# Patient Record
Sex: Female | Born: 1935
Health system: Southern US, Community
[De-identification: ages and names within clinical notes are randomized; demographics above are authoritative.]

## PROBLEM LIST (undated history)

## (undated) DIAGNOSIS — I4891 Unspecified atrial fibrillation: Secondary | ICD-10-CM

## (undated) DIAGNOSIS — I10 Essential (primary) hypertension: Secondary | ICD-10-CM

## (undated) DIAGNOSIS — E785 Hyperlipidemia, unspecified: Secondary | ICD-10-CM

## (undated) DIAGNOSIS — J449 Chronic obstructive pulmonary disease, unspecified: Secondary | ICD-10-CM

## (undated) HISTORY — DX: Essential (primary) hypertension: I10

## (undated) HISTORY — DX: Chronic obstructive pulmonary disease, unspecified: J44.9

## (undated) HISTORY — PX: CAROTID ANGIOGRAM: SHX5765

## (undated) HISTORY — DX: Hyperlipidemia, unspecified: E78.5

## (undated) HISTORY — DX: Unspecified atrial fibrillation: I48.91

---

## 2014-05-22 ENCOUNTER — Encounter: Payer: Self-pay | Admitting: Cardiology

## 2017-01-24 ENCOUNTER — Encounter: Payer: Self-pay | Admitting: Cardiology

## 2017-10-21 ENCOUNTER — Encounter: Payer: Self-pay | Admitting: Cardiology

## 2017-10-22 ENCOUNTER — Encounter: Payer: Self-pay | Admitting: Cardiology

## 2019-02-18 ENCOUNTER — Telehealth: Payer: Self-pay | Admitting: Pulmonary Disease

## 2019-02-18 NOTE — Telephone Encounter (Signed)
[place]. Is with AO tomorrow.

## 2019-02-19 ENCOUNTER — Encounter: Payer: Self-pay | Admitting: Pulmonary Disease

## 2019-02-19 ENCOUNTER — Telehealth: Payer: Self-pay | Admitting: Pulmonary Disease

## 2019-02-19 ENCOUNTER — Ambulatory Visit (INDEPENDENT_AMBULATORY_CARE_PROVIDER_SITE_OTHER): Payer: Medicare Other | Admitting: Pulmonary Disease

## 2019-02-19 ENCOUNTER — Other Ambulatory Visit: Payer: Self-pay

## 2019-02-19 VITALS — BP 118/74 | HR 70 | Temp 98.1°F | Ht 66.0 in | Wt 193.0 lb

## 2019-02-19 DIAGNOSIS — J441 Chronic obstructive pulmonary disease with (acute) exacerbation: Secondary | ICD-10-CM | POA: Diagnosis not present

## 2019-02-19 DIAGNOSIS — G4733 Obstructive sleep apnea (adult) (pediatric): Secondary | ICD-10-CM

## 2019-02-19 NOTE — Progress Notes (Signed)
Tiffany BashShelby Jennings    [id number]    [birth year]  Primary Care Physician:Patient, No Pcp Per  Referring Physician: No referring provider defined for this encounter.  Chief complaint:   Patient with a history of obstructive lung disease, chronic respiratory failure In for initial visit  HPI:  Diagnosed with COPD many years ago Relocated from [place]  Smokes about 3 to 4 cigarettes a day, over a pack a day in the past History of chronic respiratory failure-has been on oxygen at 2 L She checks her oxygen on a regular basis, off oxygen she runs as low as 83, mostly keeps it in the low 90s with oxygen at 2 L She is able to walk with a walker about 50 yards or bit more  History of atrial fibrillation  She is stable on Spiriva and albuterol as needed Uses albuterol about 4 times a day  History of chronic respiratory failure, was started on BiPAP-has been on BiPAP for 2 to 3 years    No outpatient encounter medications on file as of 02/19/2019.   No facility-administered encounter medications on file as of 02/19/2019.     Allergies as of 02/19/2019 - Review Complete 02/19/2019  Allergen Reaction Noted  . Codeine  02/19/2018    Past Medical History:  Diagnosis Date  . A-fib (HCC)   . COPD (chronic obstructive pulmonary disease) (HCC)   . Hyperlipidemia   . Hypertension     Past Surgical History:  Procedure Laterality Date  . CAROTID ANGIOGRAM      History reviewed. No pertinent family history.  Social History   Socioeconomic History  . Marital status: Single    Spouse name: Not on file  . Number of children: Not on file  . Years of education: Not on file  . Highest education level: Not on file  Occupational History  . Not on file  Social Needs  . Financial resource strain: Not on file  . Food insecurity    Worry: Not on file    Inability: Not on file  . Transportation needs    Medical: Not on file    Non-medical: Not on file  Tobacco Use  .  Smoking status: Current Every Day Smoker    Packs/day: 0.25    Types: Cigarettes  . Smokeless tobacco: Never Used  . Tobacco comment: 4-5 cigs a day  Substance and Sexual Activity  . Alcohol use: Not Currently  . Drug use: Never  . Sexual activity: Not on file  Lifestyle  . Physical activity    Days per week: Not on file    Minutes per session: Not on file  . Stress: Not on file  Relationships  . Social Musicianconnections    Talks on phone: Not on file    Gets together: Not on file    Attends religious service: Not on file    Active member of club or organization: Not on file    Attends meetings of clubs or organizations: Not on file    Relationship status: Not on file  . Intimate partner violence    Fear of current or ex partner: Not on file    Emotionally abused: Not on file    Physically abused: Not on file    Forced sexual activity: Not on file  Other Topics Concern  . Not on file  Social History Narrative  . Not on file    Review of Systems  Constitutional: Negative.   HENT:  Negative.   Eyes: Negative.   Respiratory: Positive for shortness of breath and wheezing.   Cardiovascular: Negative.   Gastrointestinal: Negative.   Endocrine: Negative.   All other systems reviewed and are negative.   Vitals:   02/19/19 1004 02/19/19 1008  BP:  118/74  Pulse:  70  Temp: 98.1 F (36.7 C)   SpO2:  95%     Physical Exam  Constitutional: She appears well-developed and well-nourished.  HENT:  Head: Normocephalic and atraumatic.  Eyes: Pupils are equal, round, and reactive to light. Right eye exhibits no discharge. Left eye exhibits no discharge.  Neck: Normal range of motion. Neck supple. No tracheal deviation present. No thyromegaly present.  Cardiovascular: Normal rate and regular rhythm.  Pulmonary/Chest: Effort normal. No respiratory distress. She has wheezes. She has no rales.  Abdominal: Soft. Bowel sounds are normal. She exhibits no distension. There is no abdominal  tenderness.     Data Reviewed: Records from previous primary physician reviewed Had a CT scan done in 2019 showing some granulomatous disease-stable from previous No PFT  Assessment:  Chronic respiratory failure -Secondary to obstructive lung disease -On oxygen supplementation at 2 L -On BiPAP at night 12/5 -Download from her machine-compliant, has an AHI of 9  Chronic obstructive pulmonary disease -Controlled symptoms on Spiriva and Proventil - Active smoker -Working on quitting -Currently smokes about 3 to 4 cigarettes a day  Deconditioning -We will start/increase activity as tolerated  Atrial fibrillation -Controlled    Plan/Recommendations: Obtain pulmonary function study Exercise oximetry-she was ambulated in the office today, required 2 L of oxygen to keep saturations over 90, she was 83% on room air Continue BiPAP -Encouraged to always bring SD card in whenever she comes in for follow-up  Continue Spiriva and albuterol  Encouraged exercise on a regular basis  Moding cessation counseling  I will see her back in the office in 3 months  Prescription to DME company for BiPAP supplies and oxygen supplementation  Encouraged to call with any significant concerns  Sherrilyn Rist MD [facility] 02/19/2019, 10:14 AM  CC: No ref. provider found

## 2019-02-19 NOTE — Telephone Encounter (Signed)
DME company is [employer]  Order for oxygen at 2 L with rest and activity  BiPAP supplies

## 2019-02-19 NOTE — Telephone Encounter (Signed)
The patient was in the office today and we have completed her walk for oxygen certification.

## 2019-02-19 NOTE — Patient Instructions (Addendum)
Chronic respiratory failure on oxygen at 2 L Chronic obstructive pulmonary disease  We will obtain a breathing study on you, plus a blood gas, 6-minute walking distance Continue using oxygen at 2 L, may bump it up to 3 L with activity Continue working on quitting smoking  Continue with Spiriva and albuterol Continue BiPAP at night  Call with any concerns  I will see you back in the office in 3 months

## 2019-02-19 NOTE — Telephone Encounter (Signed)
Order has been places for BIPAP supplies and walk for qualifying for oxygen has also been documented.

## 2019-03-02 ENCOUNTER — Telehealth: Payer: Self-pay | Admitting: Pulmonary Disease

## 2019-03-02 NOTE — Telephone Encounter (Signed)
I called pt to ask her if she did a sleep study in [place], where she relocated from, since [place] is asking for a copy of the sleep test. SHe satated she never did a sleep study and that she was put on the bipap for her CO2 levels. Versus sleep apnea. [employer] is closed and we can try to call them in the morning to let [place] know.

## 2019-03-03 ENCOUNTER — Telehealth: Payer: Self-pay

## 2019-03-03 NOTE — Telephone Encounter (Signed)
I am not accepting new patients at this time but Dr. Loletha Grayer is a wonderful female physician.

## 2019-03-03 NOTE — Telephone Encounter (Signed)
Spoke with Estill Bamberg at [facility], made her aware of the situation.  Estill Bamberg states that insurance will not cover bipap supplies without a sleep study, even if the bipap has been prescribed for a legitimate medical reason.  Estill Bamberg will forward this to the [facility] respiratory team to see what next steps need to be done to get these supplies for the pt.  Forwarding to Dr. Ander Slade as a FYI.

## 2019-03-03 NOTE — Telephone Encounter (Signed)
Copied from [place] [id number]. Topic: Appointment Scheduling - Scheduling Inquiry for Clinic >> Mar 03, 2019  2:44 PM Scherrie Gerlach wrote: Reason for CRM: Pt wants to know if Dr Deborra Medina will accept her as a pt.  Advised Dr Deborra Medina did not have nay NPP, and she asked me to ask anyway.

## 2019-03-04 NOTE — Telephone Encounter (Signed)
Will leave open for a few days or until we hear further needs from [place] at [facility]

## 2019-03-04 NOTE — Telephone Encounter (Signed)
Pt can be scheduled with Dr. Loletha Grayer if she would like, Dr. Deborra Medina is unable to accept any new patients at this time.

## 2019-03-04 NOTE — Telephone Encounter (Signed)
Called pt but had to leave a message.

## 2019-03-05 ENCOUNTER — Telehealth: Payer: Self-pay

## 2019-03-05 NOTE — Telephone Encounter (Signed)

## 2019-03-06 ENCOUNTER — Other Ambulatory Visit: Payer: Self-pay

## 2019-03-06 ENCOUNTER — Ambulatory Visit (INDEPENDENT_AMBULATORY_CARE_PROVIDER_SITE_OTHER): Payer: Medicare Other | Admitting: Family Medicine

## 2019-03-06 ENCOUNTER — Encounter: Payer: Self-pay | Admitting: Family Medicine

## 2019-03-06 VITALS — BP 120/70 | HR 74 | Ht 66.0 in | Wt 193.0 lb

## 2019-03-06 DIAGNOSIS — E039 Hypothyroidism, unspecified: Secondary | ICD-10-CM | POA: Diagnosis not present

## 2019-03-06 DIAGNOSIS — E7800 Pure hypercholesterolemia, unspecified: Secondary | ICD-10-CM | POA: Diagnosis not present

## 2019-03-06 DIAGNOSIS — I4891 Unspecified atrial fibrillation: Secondary | ICD-10-CM | POA: Diagnosis not present

## 2019-03-06 DIAGNOSIS — Z23 Encounter for immunization: Secondary | ICD-10-CM | POA: Diagnosis not present

## 2019-03-06 DIAGNOSIS — I10 Essential (primary) hypertension: Secondary | ICD-10-CM | POA: Insufficient documentation

## 2019-03-06 DIAGNOSIS — F339 Major depressive disorder, recurrent, unspecified: Secondary | ICD-10-CM | POA: Insufficient documentation

## 2019-03-06 DIAGNOSIS — Z0000 Encounter for general adult medical examination without abnormal findings: Secondary | ICD-10-CM

## 2019-03-06 NOTE — Patient Instructions (Signed)
Hypothyroidism  Hypothyroidism is when the thyroid gland does not make enough of certain hormones (it is underactive). The thyroid gland is a small gland located in the lower front part of the neck, just in front of the windpipe (trachea). This gland makes hormones that help control how the body uses food for energy (metabolism) as well as how the heart and brain function. These hormones also play a role in keeping your bones strong. When the thyroid is underactive, it produces too little of the hormones thyroxine (T4) and triiodothyronine (T3). What are the causes? This condition may be caused by:  Hashimoto's disease. This is a disease in which the body's disease-fighting system (immune system) attacks the thyroid gland. This is the most common cause.  Viral infections.  Pregnancy.  Certain medicines.  Birth defects.  Past radiation treatments to the head or neck for cancer.  Past treatment with radioactive iodine.  Past exposure to radiation in the environment.  Past surgical removal of part or all of the thyroid.  Problems with a gland in the center of the brain (pituitary gland).  Lack of enough iodine in the diet. What increases the risk? You are more likely to develop this condition if:  You are female.  You have a family history of thyroid conditions.  You use a medicine called lithium.  You take medicines that affect the immune system (immunosuppressants). What are the signs or symptoms? Symptoms of this condition include:  Feeling as though you have no energy (lethargy).  Not being able to tolerate cold.  Weight gain that is not explained by a change in diet or exercise habits.  Lack of appetite.  Dry skin.  Coarse hair.  Menstrual irregularity.  Slowing of thought processes.  Constipation.  Sadness or depression. How is this diagnosed? This condition may be diagnosed based on:  Your symptoms, your medical history, and a physical exam.  Blood  tests. You may also have imaging tests, such as an ultrasound or MRI. How is this treated? This condition is treated with medicine that replaces the thyroid hormones that your body does not make. After you begin treatment, it may take several weeks for symptoms to go away. Follow these instructions at home:  Take over-the-counter and prescription medicines only as told by your health care provider.  If you start taking any new medicines, tell your health care provider.  Keep all follow-up visits as told by your health care provider. This is important. ? As your condition improves, your dosage of thyroid hormone medicine may change. ? You will need to have blood tests regularly so that your health care provider can monitor your condition. Contact a health care provider if:  Your symptoms do not get better with treatment.  You are taking thyroid replacement medicine and you: ? Sweat a lot. ? Have tremors. ? Feel anxious. ? Lose weight rapidly. ? Cannot tolerate heat. ? Have emotional swings. ? Have diarrhea. ? Feel weak. Get help right away if you have:  Chest pain.  An irregular heartbeat.  A rapid heartbeat.  Difficulty breathing. Summary  Hypothyroidism is when the thyroid gland does not make enough of certain hormones (it is underactive).  When the thyroid is underactive, it produces too little of the hormones thyroxine (T4) and triiodothyronine (T3).  The most common cause is Hashimoto's disease, a disease in which the body's disease-fighting system (immune system) attacks the thyroid gland. The condition can also be caused by viral infections, medicine, pregnancy, or past   radiation treatment to the head or neck.  Symptoms may include weight gain, dry skin, constipation, feeling as though you do not have energy, and not being able to tolerate cold.  This condition is treated with medicine to replace the thyroid hormones that your body does not make. This information  is not intended to replace advice given to you by your health care provider. Make sure you discuss any questions you have with your health care provider. Document Released: 06/04/2005 Document Revised: 05/17/2017 Document Reviewed: 05/15/2017 Elsevier Patient Education  2020 Elsevier Inc.  

## 2019-03-06 NOTE — Progress Notes (Signed)
Established Patient Office Visit  Subjective:  Patient ID: Tiffany Jennings, female    DOB: Jan 02, 1935  Age: 84 y.o. MRN: [id number]  CC:  Chief Complaint  Patient presents with  . Establish Care    HPI Tiffany Jennings presents for establishment of care after recently moving into this area from [facility].  Carries a past medical history of atrial flutter, hypertension elevated cholesterol all managed with her current medical therapy.  History of COPD treated with chronic oxygen therapy, Spiriva and as needed albuterol.  History of hypothyroidism treated with levothyroxine.  History of depression treated with imipramine for the past 25 years.  She does well with this drug and would like to continue it.  Past Medical History:  Diagnosis Date  . A-fib ([place])   . COPD (chronic obstructive pulmonary disease) ([place])   . Hyperlipidemia   . Hypertension     Past Surgical History:  Procedure Laterality Date  . CAROTID ANGIOGRAM      History reviewed. No pertinent family history.  Social History   Socioeconomic History  . Marital status: Single    Spouse name: Not on file  . Number of children: Not on file  . Years of education: Not on file  . Highest education level: Not on file  Occupational History  . Not on file  Social Needs  . Financial resource strain: Not on file  . Food insecurity    Worry: Not on file    Inability: Not on file  . Transportation needs    Medical: Not on file    Non-medical: Not on file  Tobacco Use  . Smoking status: Current Every Day Smoker    Packs/day: 0.25    Types: Cigarettes  . Smokeless tobacco: Never Used  . Tobacco comment: 4-5 cigs a day  Substance and Sexual Activity  . Alcohol use: Not Currently  . Drug use: Never  . Sexual activity: Not on file  Lifestyle  . Physical activity    Days per week: Not on file    Minutes per session: Not on file  . Stress: Not on file  Relationships  . Social Herbalist on phone: Not on  file    Gets together: Not on file    Attends religious service: Not on file    Active member of club or organization: Not on file    Attends meetings of clubs or organizations: Not on file    Relationship status: Not on file  . Intimate partner violence    Fear of current or ex partner: Not on file    Emotionally abused: Not on file    Physically abused: Not on file    Forced sexual activity: Not on file  Other Topics Concern  . Not on file  Social History Narrative  . Not on file    Outpatient Medications Prior to Visit  Medication Sig Dispense Refill  . albuterol (VENTOLIN HFA) 108 (90 Base) MCG/ACT inhaler Inhale 2 puffs into the lungs every 6 (six) hours as needed for wheezing or shortness of breath.    Marland Kitchen amiodarone (PACERONE) 200 MG tablet Take 100 mg by mouth daily.    Marland Kitchen apixaban (ELIQUIS) 5 MG TABS tablet Take 1 tablet by mouth 2 (two) times daily.    Marland Kitchen atorvastatin (LIPITOR) 20 MG tablet Take 0.5 tablets by mouth daily.    Marland Kitchen diltiazem (CARDIZEM CD) 120 MG 24 hr capsule Take 1 capsule by mouth daily.    Marland Kitchen  donepezil (ARICEPT) 10 MG tablet Take 10 mg by mouth at bedtime.    . furosemide (LASIX) 20 MG tablet Take 20 mg by mouth daily.    Marland Kitchen imipramine (TOFRANIL) 50 MG tablet Take 50 mg by mouth daily.    Marland Kitchen levothyroxine (SYNTHROID) 75 MCG tablet Take 1 tablet by mouth daily.    . Tiotropium Bromide Monohydrate (SPIRIVA RESPIMAT) 2.5 MCG/ACT AERS Inhale 2 puffs into the lungs daily.    Marland Kitchen imipramine (TOFRANIL) 25 MG tablet Take 1 tablet by mouth daily.     No facility-administered medications prior to visit.     Allergies  Allergen Reactions  . Codeine     ROS Review of Systems  Constitutional: Negative.   HENT: Negative.   Eyes: Negative for photophobia and visual disturbance.  Respiratory: Positive for shortness of breath and wheezing. Negative for chest tightness.   Cardiovascular: Positive for palpitations. Negative for chest pain.  Gastrointestinal: Negative.    Endocrine: Negative for polyphagia and polyuria.  Genitourinary: Negative.   Musculoskeletal: Positive for gait problem. Negative for myalgias.  Skin: Negative for pallor and rash.  Allergic/Immunologic: Negative for immunocompromised state.  Neurological: Negative for seizures and speech difficulty.  Psychiatric/Behavioral: Positive for dysphoric mood.      Objective:    Physical Exam  Constitutional: She is oriented to person, place, and time. She appears well-developed and well-nourished. No distress.  HENT:  Head: Normocephalic and atraumatic.  Right Ear: External ear normal.  Left Ear: External ear normal.  Mouth/Throat: Oropharynx is clear and moist. No oropharyngeal exudate.  Eyes: Pupils are equal, round, and reactive to light. Conjunctivae are normal. Right eye exhibits no discharge. Left eye exhibits no discharge. No scleral icterus.  Neck: Neck supple. No JVD present. No tracheal deviation present. No thyromegaly present.  Cardiovascular: Normal rate, regular rhythm and normal heart sounds.  Pulmonary/Chest: Effort normal and breath sounds normal. No stridor.  Abdominal: Soft. Bowel sounds are normal.  Musculoskeletal:        General: No edema.  Lymphadenopathy:    She has no cervical adenopathy.  Neurological: She is alert and oriented to person, place, and time.  Skin: Skin is warm and dry. She is not diaphoretic.  Psychiatric: She has a normal mood and affect. Her behavior is normal.    BP 120/70   Pulse 74   Ht '5\' 6"'  (1.676 m)   Wt 193 lb (87.5 kg)   SpO2 91%   BMI 31.15 kg/m  Wt Readings from Last 3 Encounters:  03/06/19 193 lb (87.5 kg)  02/19/19 193 lb (87.5 kg)   BP Readings from Last 3 Encounters:  03/06/19 120/70  02/19/19 118/74   Guideline developer:  UpToDate (see UpToDate for funding source) Date Released: June 2014  Health Maintenance Due  Topic Date Due  . URINE MICROALBUMIN  06/17/1947  . DEXA SCAN  06/16/2002  . PNA vac Low Risk  Adult (1 of 2 - PCV13) 06/16/2002    There are no preventive care reminders to display for this patient.  No results found for: TSH No results found for: WBC, HGB, HCT, MCV, PLT No results found for: NA, K, CHLORIDE, CO2, GLUCOSE, BUN, CREATININE, BILITOT, ALKPHOS, AST, ALT, PROT, ALBUMIN, CALCIUM, ANIONGAP, EGFR, GFR No results found for: CHOL No results found for: HDL No results found for: LDLCALC No results found for: TRIG No results found for: CHOLHDL No results found for: HGBA1C    Assessment & Plan:   Problem List Items Addressed This  Visit      Cardiovascular and Mediastinum   Atrial fibrillation [facility])   Relevant Orders   Ambulatory referral to Cardiology   Essential hypertension - Primary   Relevant Orders   CBC   Comprehensive metabolic panel   Ambulatory referral to Cardiology     Endocrine   Hypothyroidism   Relevant Orders   TSH     Other   Depression, recurrent ([place])   Relevant Medications   imipramine (TOFRANIL) 50 MG tablet   Elevated cholesterol   Relevant Orders   LDL cholesterol, direct   Ambulatory referral to Cardiology    Other Visit Diagnoses    Healthcare maintenance       Relevant Orders   Ambulatory referral to Ophthalmology   Need for influenza vaccination       Relevant Orders   Flu Vaccine QUAD High Dose(Fluad) (Completed)      No orders of the defined types were placed in this encounter.   Follow-up: Return in about 3 months (around 06/05/2019).

## 2019-03-07 LAB — COMPREHENSIVE METABOLIC PANEL
AG Ratio: 1.5 (calc) (ref 1.0–2.5)
ALT: 13 U/L (ref 6–29)
AST: 15 U/L (ref 10–35)
Albumin: 3.8 g/dL (ref 3.6–5.1)
Alkaline phosphatase (APISO): 90 U/L (ref 37–153)
BUN/Creatinine Ratio: 17 (calc) (ref 6–22)
BUN: 24 mg/dL (ref 7–25)
CO2: 28 mmol/L (ref 20–32)
Calcium: 9.3 mg/dL (ref 8.6–10.4)
Chloride: 101 mmol/L (ref 98–110)
Creat: 1.38 mg/dL — ABNORMAL HIGH (ref 0.60–0.88)
Globulin: 2.5 g/dL (calc) (ref 1.9–3.7)
Glucose, Bld: 83 mg/dL (ref 65–99)
Potassium: 5 mmol/L (ref 3.5–5.3)
Sodium: 141 mmol/L (ref 135–146)
Total Bilirubin: 0.3 mg/dL (ref 0.2–1.2)
Total Protein: 6.3 g/dL (ref 6.1–8.1)

## 2019-03-07 LAB — CBC
HCT: 37.3 % (ref 35.0–45.0)
Hemoglobin: 11.9 g/dL (ref 11.7–15.5)
MCH: 29 pg (ref 27.0–33.0)
MCHC: 31.9 g/dL — ABNORMAL LOW (ref 32.0–36.0)
MCV: 91 fL (ref 80.0–100.0)
MPV: 10.7 fL (ref 7.5–12.5)
Platelets: 361 10*3/uL (ref 140–400)
RBC: 4.1 10*6/uL (ref 3.80–5.10)
RDW: 13.5 % (ref 11.0–15.0)
WBC: 8.9 10*3/uL (ref 3.8–10.8)

## 2019-03-07 LAB — LDL CHOLESTEROL, DIRECT: Direct LDL: 86 mg/dL (ref <100)

## 2019-03-07 LAB — TSH: TSH: 0.59 mIU/L (ref 0.40–4.50)

## 2019-03-18 DIAGNOSIS — J449 Chronic obstructive pulmonary disease, unspecified: Secondary | ICD-10-CM | POA: Diagnosis not present

## 2019-03-20 ENCOUNTER — Telehealth: Payer: Self-pay | Admitting: Pulmonary Disease

## 2019-03-20 MED ORDER — ALBUTEROL SULFATE HFA 108 (90 BASE) MCG/ACT IN AERS: 2.0000 | INHALATION_SPRAY | Freq: Four times a day (QID) | RESPIRATORY_TRACT | 1 refills | Status: DC | PRN

## 2019-03-20 NOTE — Telephone Encounter (Signed)
Spoke with the pt  She is requesting refill on her albuterol inhaler  Rx was sent  Nothing further needed

## 2019-03-25 ENCOUNTER — Other Ambulatory Visit: Payer: Self-pay | Admitting: Family Medicine

## 2019-03-25 MED ORDER — APIXABAN 5 MG PO TABS: 5.0000 mg | ORAL_TABLET | Freq: Two times a day (BID) | ORAL | 5 refills | Status: DC

## 2019-03-25 NOTE — Telephone Encounter (Signed)
Requested medication (s) are due for refill today: yes  Requested medication (s) are on the active medication list: yes  Last refill:  02/19/2019  Future visit scheduled: no  Notes to clinic:  Review for refill   Requested Prescriptions  Pending Prescriptions Disp Refills   apixaban (ELIQUIS) 5 MG TABS tablet 60 tablet     Sig: Take 1 tablet (5 mg total) by mouth 2 (two) times daily.     Hematology:  Anticoagulants Failed - 03/25/2019 11:27 AM      Failed - Cr in normal range and within 360 days    Creat  Date Value Ref Range Status  03/06/2019 1.38 (H) 0.60 - 0.88 mg/dL Final    Comment:    For patients >64 years of age, the reference limit for Creatinine is approximately 13% higher for people identified as African-American. .          Passed - HGB in normal range and within 360 days    Hemoglobin  Date Value Ref Range Status  03/06/2019 11.9 11.7 - 15.5 g/dL Final         Passed - PLT in normal range and within 360 days    Platelets  Date Value Ref Range Status  03/06/2019 361 140 - 400 Thousand/uL Final         Passed - HCT in normal range and within 360 days    HCT  Date Value Ref Range Status  03/06/2019 37.3 35.0 - 45.0 % Final         Passed - Valid encounter within last 12 months    Recent Outpatient Visits          2 weeks ago Essential hypertension   [facility] [place], Mortimer Fries, MD      Future Appointments            In 5 days Donato Heinz, MD [facility] [place], [facility]

## 2019-03-25 NOTE — Telephone Encounter (Signed)
Medication Refill - Medication: diltiazem (CARDIZEM CD) 120 MG 24 hr capsule, apixaban (ELIQUIS) 5 MG TABS tablet     Has the patient contacted their pharmacy? No. Pts daughter called stating the pt has an appt with cardiologist on 03/30/2019 but pt does not have enough of the medication to last her until then. Please advise.  (Agent: If no, request that the patient contact the pharmacy for the refill.) (Agent: If yes, when and what did the pharmacy advise?)  Preferred Pharmacy (with phone number or street name):  [facility] [place], [address] AT [address]  [place] [address]  Phone: [phone] Fax: [phone]  Not a 24 hour pharmacy; exact hours not known.     Agent: Please be advised that RX refills may take up to 3 business days. We ask that you follow-up with your pharmacy.

## 2019-03-26 NOTE — Progress Notes (Signed)
Cardiology Office Note:    Date:  03/30/2019   ID:  Hedi Barkan, DOB 1935-05-14, MRN [id number]  PCP:  Mliss Sax, MD  Cardiologist:  No primary care provider on file.  Electrophysiologist:  None   Referring MD: Mliss Sax,*   Chief Complaint  Patient presents with  . Atrial Fibrillation   History of Present Illness:    Tiffany Jennings is a 84 y.o. female with a hx of atrial fibrillation on Eliquis, COPD on oxygen, hypertension, hypothyroidism, hyperlipidemia, DVT/PE who is referred by Dr. Doreene Burke for evaluation of atrial fibrillation.  She recently moved to the area from [facility].  Was in hospital for pneumonia several years ago and told she had AF.   She denies any symptoms from atrial fibrillation, and does not think she has had any AF since her hospitalization.  She has been on amiodarone 100 mg daily.  The notes from her PCP in [place] indicate that amiodarone was to be discontinued due to her chronic respiratory disease.  However she has continued to take amiodarone.  She underwent a TTE in July 2020, which showed normal EF, mild concentric hypertrophy, moderate diastolic dysfunction, no significant valvular disease reported.  She has been on Eliquis and diltiazem for her AF.  Denies any bleeding issues.  Denies any chest pain.  Reports chronic dyspnea.  Reports pain in legs with walking.  Past Medical History:  Diagnosis Date  . A-fib (HCC)   . COPD (chronic obstructive pulmonary disease) (HCC)   . Hyperlipidemia   . Hypertension     Past Surgical History:  Procedure Laterality Date  . CAROTID ANGIOGRAM      Current Medications: Current Meds  Medication Sig  . albuterol (VENTOLIN HFA) 108 (90 Base) MCG/ACT inhaler Inhale 2 puffs into the lungs every 6 (six) hours as needed for wheezing or shortness of breath.  Marland Kitchen apixaban (ELIQUIS) 5 MG TABS tablet Take 1 tablet (5 mg total) by mouth 2 (two) times daily.  Marland Kitchen atorvastatin (LIPITOR) 10 MG tablet    . diltiazem (CARDIZEM CD) 120 MG 24 hr capsule Take 1 capsule (120 mg total) by mouth daily.  Marland Kitchen donepezil (ARICEPT) 10 MG tablet Take 10 mg by mouth at bedtime.  . furosemide (LASIX) 20 MG tablet Take 20 mg by mouth daily.  Marland Kitchen imipramine (TOFRANIL) 50 MG tablet Take 50 mg by mouth daily.  Marland Kitchen levothyroxine (SYNTHROID) 75 MCG tablet Take 1 tablet by mouth daily.  . Tiotropium Bromide Monohydrate (SPIRIVA RESPIMAT) 2.5 MCG/ACT AERS Inhale 2 puffs into the lungs daily.  . [DISCONTINUED] amiodarone (PACERONE) 200 MG tablet Take 100 mg by mouth daily. Take half a tablet daily.  . [DISCONTINUED] diltiazem (CARDIZEM CD) 120 MG 24 hr capsule Take 1 capsule by mouth daily.     Allergies:   Codeine   Social History   Socioeconomic History  . Marital status: Single    Spouse name: Not on file  . Number of children: Not on file  . Years of education: Not on file  . Highest education level: Not on file  Occupational History  . Not on file  Social Needs  . Financial resource strain: Not on file  . Food insecurity    Worry: Not on file    Inability: Not on file  . Transportation needs    Medical: Not on file    Non-medical: Not on file  Tobacco Use  . Smoking status: Current Every Day Smoker    Packs/day: 0.25  Types: Cigarettes  . Smokeless tobacco: Never Used  . Tobacco comment: 4-5 cigs a day  Substance and Sexual Activity  . Alcohol use: Not Currently  . Drug use: Never  . Sexual activity: Not on file  Lifestyle  . Physical activity    Days per week: Not on file    Minutes per session: Not on file  . Stress: Not on file  Relationships  . Social Herbalist on phone: Not on file    Gets together: Not on file    Attends religious service: Not on file    Active member of club or organization: Not on file    Attends meetings of clubs or organizations: Not on file    Relationship status: Not on file  Other Topics Concern  . Not on file  Social History Narrative  . Not  on file     Family History: No relevant family history of heart diseaseNo relevant family history of heart disease  ROS:   Please see the history of present illness.     All other systems reviewed and are negative.  EKGs/Labs/Other Studies Reviewed:    The following studies were reviewed today:   EKG:  EKG is ordered today.  The ekg ordered today demonstrates normal sinus rhythm with sinus arrhythmia, rate 69, no ST/T abnormalities  Recent Labs: 03/06/2019: ALT 13; BUN 24; Creat 1.38; Hemoglobin 11.9; Platelets 361; Potassium 5.0; Sodium 141; TSH 0.59  Recent Lipid Panel    Component Value Date/Time   LDLDIRECT 86 03/06/2019 1142    Physical Exam:    VS:  BP 138/73   Pulse 69   Ht 5\' 6"  (1.676 m)   Wt 195 lb (88.5 kg)   SpO2 95%   BMI 31.47 kg/m     Wt Readings from Last 3 Encounters:  03/30/19 195 lb (88.5 kg)  03/06/19 193 lb (87.5 kg)  02/19/19 193 lb (87.5 kg)     GEN:  in no acute distress, on oxygen HEENT: Normal NECK: No JVD; No carotid bruits LYMPHATICS: No lymphadenopathy CARDIAC: RRR, no murmurs, rubs, gallops RESPIRATORY: Bilateral expiratory wheezing ABDOMEN: Soft, non-tender, non-distended MUSCULOSKELETAL:  No edema; No deformity  SKIN: Warm and dry NEUROLOGIC:  Alert and oriented x 3 PSYCHIATRIC:  Normal affect   ASSESSMENT:    1. Atrial fibrillation, unspecified type ([place])   2. Claudication ([place])   3. Hyperlipidemia, unspecified hyperlipidemia type   4. Essential hypertension   5. Tobacco use    PLAN:    In order of problems listed above:  Atrial fibrillation: On diltiazem 120 mg, amiodarone 100 mg, and Eliquis 5 mg twice daily.  CHADS-VASc 4 (HTN, agex2, female).  TTE from 12/2018 shows normal EF, no significant valvular disease, moderate diastolic dysfunction -Discontinue amiodarone due to risk of pulmonary toxicity given her COPD  -Since she does not have symptoms when in atrial fibrillation, can consider monitor in future to evaluate  AF burden -Continue diltiazem for rate control -Continue Eliquis for anticoagulation  Hypertension: On diltiazem 120 mg.  Appears controlled  Hyperlipidemia: On atorvastatin 10 mg.  LDL 86.  Tobacco use: smoked x 30 years for 1 ppd.  Over last 3 months is down to 2-3 cigarettes per day.  Risks of tobacco use were discussed and cessation strongly encouraged  Claudication: We will check ABIs  COPD: on home oxygen.  Follows with pulmonology.  RTC in 3 months   Medication Adjustments/Labs and Tests Ordered: Current medicines are reviewed at  length with the patient today.  Concerns regarding medicines are outlined above.  Orders Placed This Encounter  Procedures  . EKG 12-Lead  . VAS US ABI WITH/WO TBI   Meds ordered this encounter  Medications  . diltiazem (CARDIZEM CD) 120 MG 24 hr capsule    Sig: Take 1 capsule (120 mg total) by mouth daily.    Dispense:  90 capsule    Refill:  3    Patient Instructions  Medication Instructions:  Stop: Amiodarone 200 mg  If you need a refill on your cardiac medications before your next appointment, please call your pharmacy.   Lab work: None  Testing/Procedures: Your physician has requested that you have an ankle brachial index (ABI). During this test an ultrasound and blood pressure cuff are used to evaluate the arteries that supply the arms and legs with blood. Allow thirty minutes for this exam. There are no restrictions or special instructions. [address]   Follow-Up: Your physician recommends that you schedule a follow-up appointment in 3 months with Dr. Bjorn PippinSchumann.         Signed, [place] L Gracelee Stemmler, MD  03/30/2019 3:43 PM    [facility]

## 2019-03-30 ENCOUNTER — Ambulatory Visit: Payer: Medicare Other | Admitting: Cardiology

## 2019-03-30 ENCOUNTER — Other Ambulatory Visit: Payer: Self-pay

## 2019-03-30 VITALS — BP 138/73 | HR 69 | Ht 66.0 in | Wt 195.0 lb

## 2019-03-30 DIAGNOSIS — Z720 Tobacco use: Secondary | ICD-10-CM | POA: Diagnosis not present

## 2019-03-30 DIAGNOSIS — I4891 Unspecified atrial fibrillation: Secondary | ICD-10-CM | POA: Diagnosis not present

## 2019-03-30 DIAGNOSIS — I739 Peripheral vascular disease, unspecified: Secondary | ICD-10-CM

## 2019-03-30 DIAGNOSIS — I10 Essential (primary) hypertension: Secondary | ICD-10-CM

## 2019-03-30 DIAGNOSIS — E785 Hyperlipidemia, unspecified: Secondary | ICD-10-CM | POA: Diagnosis not present

## 2019-03-30 MED ORDER — DILTIAZEM HCL ER COATED BEADS 120 MG PO CP24: 120.0000 mg | ORAL_CAPSULE | Freq: Every day | ORAL | 3 refills | Status: DC

## 2019-03-30 NOTE — Patient Instructions (Signed)
Medication Instructions:  Stop: Amiodarone 200 mg  If you need a refill on your cardiac medications before your next appointment, please call your pharmacy.   Lab work: None  Testing/Procedures: Your physician has requested that you have an ankle brachial index (ABI). During this test an ultrasound and blood pressure cuff are used to evaluate the arteries that supply the arms and legs with blood. Allow thirty minutes for this exam. There are no restrictions or special instructions. [place]. [address]   Follow-Up: Your physician recommends that you schedule a follow-up appointment in 3 months with Dr. Gardiner Rhyme.

## 2019-04-08 ENCOUNTER — Other Ambulatory Visit: Payer: Self-pay | Admitting: Cardiology

## 2019-04-08 DIAGNOSIS — I739 Peripheral vascular disease, unspecified: Secondary | ICD-10-CM

## 2019-04-13 ENCOUNTER — Ambulatory Visit (HOSPITAL_COMMUNITY)
Admission: RE | Admit: 2019-04-13 | Discharge: 2019-04-13 | Disposition: A | Discharge status: Home / Self Care | Payer: Medicare Other | Source: Ambulatory Visit | Attending: Cardiology | Admitting: Cardiology

## 2019-04-13 ENCOUNTER — Other Ambulatory Visit: Payer: Self-pay

## 2019-04-13 DIAGNOSIS — I739 Peripheral vascular disease, unspecified: Secondary | ICD-10-CM

## 2019-04-18 DIAGNOSIS — J449 Chronic obstructive pulmonary disease, unspecified: Secondary | ICD-10-CM | POA: Diagnosis not present

## 2019-05-11 ENCOUNTER — Telehealth: Payer: Self-pay | Admitting: Pulmonary Disease

## 2019-05-11 MED ORDER — SPIRIVA RESPIMAT 2.5 MCG/ACT IN AERS: 2.0000 | INHALATION_SPRAY | Freq: Every day | RESPIRATORY_TRACT | 5 refills | Status: DC

## 2019-05-11 NOTE — Telephone Encounter (Signed)
Call returned to patient daughter Tiffany Jennings (dpr), confirmed DOB, medication, and pharmacy. Refill sent. Voiced understanding. Nothing further needed at this time.

## 2019-05-13 DIAGNOSIS — H16223 Keratoconjunctivitis sicca, not specified as Sjogren's, bilateral: Secondary | ICD-10-CM | POA: Diagnosis not present

## 2019-05-13 DIAGNOSIS — Z961 Presence of intraocular lens: Secondary | ICD-10-CM | POA: Diagnosis not present

## 2019-05-18 DIAGNOSIS — J449 Chronic obstructive pulmonary disease, unspecified: Secondary | ICD-10-CM | POA: Diagnosis not present

## 2019-05-20 ENCOUNTER — Other Ambulatory Visit: Payer: Self-pay | Admitting: Family Medicine

## 2019-05-20 MED ORDER — DONEPEZIL HCL 10 MG PO TABS: 10.0000 mg | ORAL_TABLET | Freq: Every day | ORAL | 1 refills | Status: DC

## 2019-05-20 MED ORDER — IMIPRAMINE HCL 50 MG PO TABS: 50.0000 mg | ORAL_TABLET | Freq: Every day | ORAL | 1 refills | Status: DC

## 2019-05-20 NOTE — Telephone Encounter (Signed)
rx refill imipramine (TOFRANIL) 50 MG tablet  Request 90 day refill  donepezil (ARICEPT) 10 MG tablet    PHARMACY [id number] - [place], [place] - [address] AT [place] [phone] (Phone) [phone] (Fax)

## 2019-05-20 NOTE — Telephone Encounter (Signed)
Rx sent 

## 2019-05-26 ENCOUNTER — Other Ambulatory Visit: Payer: Self-pay | Admitting: Family Medicine

## 2019-05-26 DIAGNOSIS — E039 Hypothyroidism, unspecified: Secondary | ICD-10-CM

## 2019-05-26 MED ORDER — LEVOTHYROXINE SODIUM 75 MCG PO TABS: 75.0000 ug | ORAL_TABLET | Freq: Every day | ORAL | 1 refills | Status: DC

## 2019-05-26 NOTE — Telephone Encounter (Signed)
Medication Refill - Medication: levothyroxine (SYNTHROID) 75 MCG tablet   Preferred Pharmacy (with phone number or street name):  [facility] [place], [address] AT [address] [place] [phone] (Phone) [phone] (Fax)     Agent: Please be advised that RX refills may take up to 3 business days. We ask that you follow-up with your pharmacy.

## 2019-05-26 NOTE — Telephone Encounter (Signed)
Dr. Ethelene Hal please advise, we never send this med for the pt before.

## 2019-05-26 NOTE — Telephone Encounter (Signed)
Requested medication (s) are due for refill today: yes  Requested medication (s) are on the active medication list: yes  Last refill:  02/19/2019  Future visit scheduled: no  Notes to clinic: last filled by historical provider  Review for refill   Requested Prescriptions  Pending Prescriptions Disp Refills   levothyroxine (SYNTHROID) 75 MCG tablet       Sig: Take 1 tablet (75 mcg total) by mouth daily.     Endocrinology:  Hypothyroid Agents Failed - 05/26/2019  9:36 AM      Failed - TSH needs to be rechecked within 3 months after an abnormal result. Refill until TSH is due.      Passed - TSH in normal range and within 360 days    TSH  Date Value Ref Range Status  03/06/2019 0.59 0.40 - 4.50 mIU/L Final         Passed - Valid encounter within last 12 months    Recent Outpatient Visits          2 months ago Essential hypertension   [facility] Ethelene Hal, Mortimer Fries, MD      Future Appointments            In 1 month Donato Heinz, MD [facility] [place], [facility]

## 2019-06-08 ENCOUNTER — Other Ambulatory Visit: Payer: Self-pay | Admitting: Pulmonary Disease

## 2019-06-16 DIAGNOSIS — H16223 Keratoconjunctivitis sicca, not specified as Sjogren's, bilateral: Secondary | ICD-10-CM | POA: Diagnosis not present

## 2019-06-18 DIAGNOSIS — J449 Chronic obstructive pulmonary disease, unspecified: Secondary | ICD-10-CM | POA: Diagnosis not present

## 2019-06-22 ENCOUNTER — Ambulatory Visit (INDEPENDENT_AMBULATORY_CARE_PROVIDER_SITE_OTHER): Payer: Medicare Other | Admitting: Family Medicine

## 2019-06-22 ENCOUNTER — Other Ambulatory Visit: Payer: Self-pay

## 2019-06-22 ENCOUNTER — Encounter: Payer: Self-pay | Admitting: Family Medicine

## 2019-06-22 VITALS — BP 132/64 | HR 64 | Temp 97.9°F | Wt 201.8 lb

## 2019-06-22 DIAGNOSIS — I10 Essential (primary) hypertension: Secondary | ICD-10-CM | POA: Diagnosis not present

## 2019-06-22 DIAGNOSIS — F339 Major depressive disorder, recurrent, unspecified: Secondary | ICD-10-CM

## 2019-06-22 DIAGNOSIS — R2681 Unsteadiness on feet: Secondary | ICD-10-CM | POA: Insufficient documentation

## 2019-06-22 DIAGNOSIS — R7309 Other abnormal glucose: Secondary | ICD-10-CM | POA: Diagnosis not present

## 2019-06-22 LAB — BASIC METABOLIC PANEL
BUN: 21 mg/dL (ref 6–23)
CO2: 31 mEq/L (ref 19–32)
Calcium: 9.3 mg/dL (ref 8.4–10.5)
Chloride: 101 mEq/L (ref 96–112)
Creatinine, Ser: 1.3 mg/dL — ABNORMAL HIGH (ref 0.40–1.20)
GFR: 39.21 mL/min — ABNORMAL LOW (ref >60.00)
Glucose, Bld: 105 mg/dL — ABNORMAL HIGH (ref 70–99)
Potassium: 4.6 mEq/L (ref 3.5–5.1)
Sodium: 142 mEq/L (ref 135–145)

## 2019-06-22 LAB — HEMOGLOBIN A1C: Hgb A1c MFr Bld: 6.3 % (ref 4.6–6.5)

## 2019-06-22 MED ORDER — IMIPRAMINE HCL 50 MG PO TABS: ORAL_TABLET | ORAL | 5 refills | Status: DC

## 2019-06-22 MED ORDER — FUROSEMIDE 20 MG PO TABS: 20.0000 mg | ORAL_TABLET | Freq: Every day | ORAL | 2 refills | Status: DC

## 2019-06-22 NOTE — Progress Notes (Signed)
Established Patient Office Visit  Subjective:  Patient ID: Tiffany Jennings, female    DOB: Nov 11, 1934  Age: 85 y.o. MRN: [id number]  CC:  Chief Complaint  Patient presents with  . Follow-up    pt here for f/u on medications. No concerns pt daughter states that she gives mother 2 tablets of Imipramine instead of 1 tablet daily, will need refills. Also pt states that she would like the original brand of Albuterol instead of what she is on now.    HPI Tiffany Jennings presents for follow-up of her depression, hypertension and COPD.  Patient has been on imipramine since 1990.  It is worked well for her.  She had taken up to 150 mg but had experienced what sounds like anticholinergic side effects with constipation and dry mouth.  She had gone down to 50 mg but experienced return of her depressive symptoms symptoms.  Right now she is taking 100 mg alternating with 50 mg and this seems to work well for her.  It was her understanding that she take the albuterol inhaler every 6 hours.  This seems to be causing some nervousness.  She continues to take the Spiriva daily.  Continues on home oxygen.  Unfortunately continues to smoke.  She had seen the ophthalmologist and they had asked her whether or not she was a diabetic after they completed her exam.  As far as she knows she is not.  Never developed gestational diabetes.  Would like to be checked.  She uses the walker but does not ambulate much.  She is interested in attending physical therapy for lower extremity strengthening.  Past Medical History:  Diagnosis Date  . A-fib (HCC)   . COPD (chronic obstructive pulmonary disease) (HCC)   . Hyperlipidemia   . Hypertension     Past Surgical History:  Procedure Laterality Date  . CAROTID ANGIOGRAM      History reviewed. No pertinent family history.  Social History   Socioeconomic History  . Marital status: Single    Spouse name: Not on file  . Number of children: Not on file  . Years of  education: Not on file  . Highest education level: Not on file  Occupational History  . Not on file  Tobacco Use  . Smoking status: Current Every Day Smoker    Packs/day: 0.25    Types: Cigarettes  . Smokeless tobacco: Never Used  . Tobacco comment: 4-5 cigs a day  Substance and Sexual Activity  . Alcohol use: Not Currently  . Drug use: Never  . Sexual activity: Not on file  Other Topics Concern  . Not on file  Social History Narrative  . Not on file   Social Determinants of Health   Financial Resource Strain:   . Difficulty of Paying Living Expenses: Not on file  Food Insecurity:   . Worried About Programme researcher, broadcasting/film/video in the Last Year: Not on file  . Ran Out of Food in the Last Year: Not on file  Transportation Needs:   . Lack of Transportation (Medical): Not on file  . Lack of Transportation (Non-Medical): Not on file  Physical Activity:   . Days of Exercise per Week: Not on file  . Minutes of Exercise per Session: Not on file  Stress:   . Feeling of Stress : Not on file  Social Connections:   . Frequency of Communication with Friends and Family: Not on file  . Frequency of Social Gatherings with Friends and Family: Not  on file  . Attends Religious Services: Not on file  . Active Member of Clubs or Organizations: Not on file  . Attends Archivist Meetings: Not on file  . Marital Status: Not on file  Intimate Partner Violence:   . Fear of Current or Ex-Partner: Not on file  . Emotionally Abused: Not on file  . Physically Abused: Not on file  . Sexually Abused: Not on file    Outpatient Medications Prior to Visit  Medication Sig Dispense Refill  . albuterol (VENTOLIN HFA) 108 (90 Base) MCG/ACT inhaler INHALE 2 PUFFS INTO THE LUNGS EVERY 6 HOURS AS NEEDED FOR WHEEZING OR SHORTNESS OF BREATH 18 g 1  . apixaban (ELIQUIS) 5 MG TABS tablet Take 1 tablet (5 mg total) by mouth 2 (two) times daily. 60 tablet 5  . atorvastatin (LIPITOR) 10 MG tablet     .  diltiazem (CARDIZEM CD) 120 MG 24 hr capsule Take 1 capsule (120 mg total) by mouth daily. 90 capsule 3  . donepezil (ARICEPT) 10 MG tablet Take 1 tablet (10 mg total) by mouth at bedtime. 90 tablet 1  . levothyroxine (SYNTHROID) 75 MCG tablet Take 1 tablet (75 mcg total) by mouth daily. 90 tablet 1  . furosemide (LASIX) 20 MG tablet Take 20 mg by mouth daily.    Marland Kitchen imipramine (TOFRANIL) 50 MG tablet Take 1 tablet (50 mg total) by mouth daily. 90 tablet 1  . Tiotropium Bromide Monohydrate (SPIRIVA RESPIMAT) 2.5 MCG/ACT AERS Inhale 2 puffs into the lungs daily. 4 g 5   No facility-administered medications prior to visit.    Allergies  Allergen Reactions  . Codeine     ROS Review of Systems  Constitutional: Negative for diaphoresis, fatigue, fever and unexpected weight change.  HENT: Negative.   Eyes: Negative for photophobia and visual disturbance.  Respiratory: Positive for shortness of breath and wheezing. Negative for chest tightness.   Cardiovascular: Negative for chest pain and palpitations.  Gastrointestinal: Negative.   Genitourinary: Negative.   Musculoskeletal: Positive for gait problem. Negative for back pain.  Neurological: Positive for weakness.  Hematological: Does not bruise/bleed easily.      Objective:    Physical Exam  Constitutional: She is oriented to person, place, and time. She appears well-developed and well-nourished. No distress.  HENT:  Head: Normocephalic and atraumatic.  Right Ear: External ear normal.  Left Ear: External ear normal.  Eyes: Conjunctivae are normal. Right eye exhibits no discharge. Left eye exhibits no discharge. No scleral icterus.  Neck: No JVD present. No tracheal deviation present. No thyromegaly present.  Cardiovascular: Normal rate, regular rhythm and normal heart sounds.  Pulmonary/Chest: Effort normal. No stridor. She has decreased breath sounds. She has no wheezes. She has no rhonchi. She has no rales.  Lymphadenopathy:     She has no cervical adenopathy.  Neurological: She is alert and oriented to person, place, and time.  Skin: Skin is warm and dry. She is not diaphoretic.  Psychiatric: She has a normal mood and affect. Her behavior is normal.    BP 132/64   Pulse 64   Temp 97.9 F (36.6 C)   Wt 201 lb 12.8 oz (91.5 kg)   SpO2 98%   BMI 32.57 kg/m  Wt Readings from Last 3 Encounters:  06/22/19 201 lb 12.8 oz (91.5 kg)  03/30/19 195 lb (88.5 kg)  03/06/19 193 lb (87.5 kg)     Health Maintenance Due  Topic Date Due  . URINE MICROALBUMIN  06/17/1947  . PNA vac Low Risk Adult (1 of 2 - PCV13) 06/16/2002    There are no preventive care reminders to display for this patient.  Lab Results  Component Value Date   TSH 0.59 03/06/2019   Lab Results  Component Value Date   WBC 8.9 03/06/2019   HGB 11.9 03/06/2019   HCT 37.3 03/06/2019   MCV 91.0 03/06/2019   PLT 361 03/06/2019   Lab Results  Component Value Date   NA 141 03/06/2019   K 5.0 03/06/2019   CO2 28 03/06/2019   GLUCOSE 83 03/06/2019   BUN 24 03/06/2019   CREATININE 1.38 (H) 03/06/2019   BILITOT 0.3 03/06/2019   AST 15 03/06/2019   ALT 13 03/06/2019   PROT 6.3 03/06/2019   CALCIUM 9.3 03/06/2019   No results found for: CHOL No results found for: HDL No results found for: LDLCALC No results found for: TRIG No results found for: CHOLHDL No results found for: CBSW9Q    Assessment & Plan:   Problem List Items Addressed This Visit      Cardiovascular and Mediastinum   Essential hypertension   Relevant Medications   furosemide (LASIX) 20 MG tablet   Other Relevant Orders   Basic Metabolic Panel (BMET)     Other   Depression, recurrent (HCC) - Primary   Relevant Medications   imipramine (TOFRANIL) 50 MG tablet   Gait instability   Relevant Orders   Ambulatory referral to Physical Therapy   Elevated glucose   Relevant Orders   HgB A1c      Meds ordered this encounter  Medications  . furosemide (LASIX) 20  MG tablet    Sig: Take 1 tablet (20 mg total) by mouth daily.    Dispense:  30 tablet    Refill:  2  . DISCONTD: imipramine (TOFRANIL) 50 MG tablet    Sig: May take 2-3 nightly.    Dispense:  90 tablet    Refill:  5  . imipramine (TOFRANIL) 50 MG tablet    Sig: May take 1-2 nightly.    Dispense:  60 tablet    Refill:  5    Follow-up: Return in about 3 months (around 09/20/2019).    Mliss Sax, MD

## 2019-06-23 ENCOUNTER — Telehealth: Payer: Self-pay | Admitting: Family Medicine

## 2019-06-23 NOTE — Telephone Encounter (Signed)
Patient returned your call, please call patient back at [phone].

## 2019-06-29 ENCOUNTER — Encounter: Payer: Self-pay | Admitting: *Deleted

## 2019-06-29 ENCOUNTER — Encounter (INDEPENDENT_AMBULATORY_CARE_PROVIDER_SITE_OTHER): Payer: Self-pay

## 2019-06-29 ENCOUNTER — Other Ambulatory Visit: Payer: Self-pay

## 2019-06-29 ENCOUNTER — Encounter: Payer: Self-pay | Admitting: Cardiology

## 2019-06-29 ENCOUNTER — Ambulatory Visit: Payer: Medicare Other | Admitting: Cardiology

## 2019-06-29 VITALS — BP 98/58 | HR 74 | Temp 95.0°F | Ht 66.0 in | Wt 198.0 lb

## 2019-06-29 DIAGNOSIS — I10 Essential (primary) hypertension: Secondary | ICD-10-CM

## 2019-06-29 DIAGNOSIS — Z720 Tobacco use: Secondary | ICD-10-CM

## 2019-06-29 DIAGNOSIS — E785 Hyperlipidemia, unspecified: Secondary | ICD-10-CM | POA: Diagnosis not present

## 2019-06-29 DIAGNOSIS — I4891 Unspecified atrial fibrillation: Secondary | ICD-10-CM | POA: Diagnosis not present

## 2019-06-29 NOTE — Progress Notes (Signed)
Patient ID: Tiffany Jennings, female   DOB: 29-Jan-1935, 85 y.o.   MRN: [id number] 14 day ZIO XT long term holter monitor to be mailed to the patients home.  Instructions included in the monitor kit.

## 2019-06-29 NOTE — Patient Instructions (Addendum)
Medication Instructions:  Your Physician recommend you continue on your current medication as directed.    *If you need a refill on your cardiac medications before your next appointment, please call your pharmacy*  Lab Work: None  Testing/Procedures: Our physician has recommended that you wear an 14  DAY ZIO-PATCH monitor. The Zio patch cardiac monitor continuously records heart rhythm data for up to 14 days, this is for patients being evaluated for multiple types heart rhythms. For the first 24 hours post application, please avoid getting the Zio monitor wet in the shower or by excessive sweating during exercise. After that, feel free to carry on with regular activities. Keep soaps and lotions away from the ZIO XT Patch.   Someone from our office will call to mail monitor.      Follow-Up: At [facility], you and your health needs are our priority.  As part of our continuing mission to provide you with exceptional heart care, we have created designated Provider Care Teams.  These Care Teams include your primary Cardiologist (physician) and Advanced Practice Providers (APPs -  Physician Assistants and Nurse Practitioners) who all work together to provide you with the care you need, when you need it.  Your next appointment:   6 month(s)  The format for your next appointment:   In Person  Provider:   Epifanio Lesches, MD  Christena Deem- Long Term Monitor Instructions   Your physician has requested you wear your ZIO patch monitor___14____days.   This is a single patch monitor.  [employer] supplies one patch monitor per enrollment.  Additional stickers are not available.   Please do not apply patch if you will be having a Nuclear Stress Test, Echocardiogram, Cardiac CT, MRI, or Chest Xray during the time frame you would be wearing the monitor. The patch cannot be worn during these tests.  You cannot remove and re-apply the ZIO XT patch monitor.   Your ZIO patch monitor will be sent USPS  Priority mail from [facility] directly to your home address. The monitor may also be mailed to a PO BOX if home delivery is not available.   It may take 3-5 days to receive your monitor after you have been enrolled.   Once you have received you monitor, please review enclosed instructions.  Your monitor has already been registered assigning a specific monitor serial # to you.   Applying the monitor   Shave hair from upper left chest.   Hold abrader disc by orange tab.  Rub abrader in 40 strokes over left upper chest as indicated in your monitor instructions.   Clean area with 4 enclosed alcohol pads .  Use all pads to assure are is cleaned thoroughly.  Let dry.   Apply patch as indicated in monitor instructions.  Patch will be place under collarbone on left side of chest with arrow pointing upward.   Rub patch adhesive wings for 2 minutes.Remove white label marked "1".  Remove white label marked "2".  Rub patch adhesive wings for 2 additional minutes.   While looking in a mirror, press and release button in center of patch.  A small green light will flash 3-4 times .  This will be your only indicator the monitor has been turned on.     Do not shower for the first 24 hours.  You may shower after the first 24 hours.   Press button if you feel a symptom. You will hear a small click.  Record Date, Time and Symptom  in the Patient Log Book.   When you are ready to remove patch, follow instructions on last 2 pages of Patient Log Book.  Stick patch monitor onto last page of Patient Log Book.   Place Patient Log Book in [place] box.  Use locking tab on box and tape box closed securely.  The [employer] and [employer] has [employer] on it.  Please place in mailbox as soon as possible.  Your physician should have your test results approximately 7 days after the monitor has been mailed back to [facility].   Call [place] at [phone] if you have questions regarding  your ZIO XT patch monitor.  Call them immediately if you see an orange light blinking on your monitor.   If your monitor falls off in less than 4 days contact our Monitor department at [phone].  If your monitor becomes loose or falls off after 4 days call [employer] at [phone] for suggestions on securing your monitor.

## 2019-06-29 NOTE — Progress Notes (Signed)
Cardiology Office Note:    Date:  06/29/2019   ID:  Tiffany Jennings, DOB Dec 02, 1934, MRN [id number]  PCP:  Mliss Sax, MD  Cardiologist:  No primary care provider on file.  Electrophysiologist:  None   Referring MD: Mliss Sax,*   No chief complaint on file.  History of Present Illness:    Tiffany Jennings is a 85 y.o. female with a hx of atrial fibrillation on Eliquis, COPD on oxygen, hypertension, hypothyroidism, hyperlipidemia, DVT/PE who presents for follow-up.  She was initially seen on 03/30/2019 for evaluation of atrial fibrillation.  She had moved to the area from [facility].  Was in hospital for pneumonia several years ago and told she had AF.   She denies any symptoms from atrial fibrillation, and does not think she has had any AF since her hospitalization.  She has been on amiodarone 100 mg daily.  The notes from her PCP in [place] indicate that amiodarone was to be discontinued due to her chronic respiratory disease.  However she has continued to take amiodarone.  She underwent a TTE in July 2020, which showed normal EF, mild concentric hypertrophy, moderate diastolic dysfunction, no significant valvular disease reported.  She has been on Eliquis and diltiazem for her AF.  Amiodarone was discontinued and initial clinic visit in 03/30/2019.  Since her last clinic visit, she reports that she has been doing well.  Denies any symptoms to suggest atrial fibrillation, though reports she did not feel it before when she went into AF.  Denies any palpitations, chest pain, syncope, lightheadedness.  Reports chronic dyspnea.  Reports she has some issues with dizziness in the morning.  Denies any bleeding issues on Eliquis.  Reports that BP has been ~120s.  Continues to smoke but down to 3 cigarettes/day.   Past Medical History:  Diagnosis Date  . A-fib (HCC)   . COPD (chronic obstructive pulmonary disease) (HCC)   . Hyperlipidemia   . Hypertension     Past Surgical  History:  Procedure Laterality Date  . CAROTID ANGIOGRAM      Current Medications: Current Meds  Medication Sig  . albuterol (VENTOLIN HFA) 108 (90 Base) MCG/ACT inhaler INHALE 2 PUFFS INTO THE LUNGS EVERY 6 HOURS AS NEEDED FOR WHEEZING OR SHORTNESS OF BREATH  . apixaban (ELIQUIS) 5 MG TABS tablet Take 1 tablet (5 mg total) by mouth 2 (two) times daily.  Marland Kitchen atorvastatin (LIPITOR) 10 MG tablet   . diltiazem (CARDIZEM CD) 120 MG 24 hr capsule Take 1 capsule (120 mg total) by mouth daily.  Marland Kitchen donepezil (ARICEPT) 10 MG tablet Take 1 tablet (10 mg total) by mouth at bedtime.  . furosemide (LASIX) 20 MG tablet Take 1 tablet (20 mg total) by mouth daily.  Marland Kitchen imipramine (TOFRANIL) 50 MG tablet May take 1-2 nightly.  . levothyroxine (SYNTHROID) 75 MCG tablet Take 1 tablet (75 mcg total) by mouth daily.     Allergies:   Codeine   Social History   Socioeconomic History  . Marital status: Single    Spouse name: Not on file  . Number of children: Not on file  . Years of education: Not on file  . Highest education level: Not on file  Occupational History  . Not on file  Tobacco Use  . Smoking status: Current Every Day Smoker    Packs/day: 0.25    Types: Cigarettes  . Smokeless tobacco: Never Used  . Tobacco comment: 4-5 cigs a day  Substance and Sexual Activity  .  Alcohol use: Not Currently  . Drug use: Never  . Sexual activity: Not on file  Other Topics Concern  . Not on file  Social History Narrative  . Not on file   Social Determinants of Health   Financial Resource Strain:   . Difficulty of Paying Living Expenses: Not on file  Food Insecurity:   . Worried About Charity fundraiser in the Last Year: Not on file  . Ran Out of Food in the Last Year: Not on file  Transportation Needs:   . Lack of Transportation (Medical): Not on file  . Lack of Transportation (Non-Medical): Not on file  Physical Activity:   . Days of Exercise per Week: Not on file  . Minutes of Exercise per  Session: Not on file  Stress:   . Feeling of Stress : Not on file  Social Connections:   . Frequency of Communication with Friends and Family: Not on file  . Frequency of Social Gatherings with Friends and Family: Not on file  . Attends Religious Services: Not on file  . Active Member of Clubs or Organizations: Not on file  . Attends Archivist Meetings: Not on file  . Marital Status: Not on file     Family History: No relevant family history of heart diseaseNo relevant family history of heart disease  ROS:   Please see the history of present illness.     All other systems reviewed and are negative.  EKGs/Labs/Other Studies Reviewed:    The following studies were reviewed today:   EKG:  EKG is ordered today.  The ekg ordered today demonstrates normal sinus rhythm, PACs, rate 74   Recent Labs: 03/06/2019: ALT 13; Hemoglobin 11.9; Platelets 361; TSH 0.59 06/22/2019: BUN 21; Creatinine, Ser 1.30; Potassium 4.6; Sodium 142  Recent Lipid Panel    Component Value Date/Time   LDLDIRECT 86 03/06/2019 1142    Physical Exam:    VS:  BP (!) 98/58   Pulse 74   Temp (!) 95 F (35 C)   Ht 5\' 6"  (1.676 m)   Wt 198 lb (89.8 kg)   SpO2 97%   BMI 31.96 kg/m     Wt Readings from Last 3 Encounters:  06/29/19 198 lb (89.8 kg)  06/22/19 201 lb 12.8 oz (91.5 kg)  03/30/19 195 lb (88.5 kg)     GEN:  in no acute distress, on oxygen HEENT: Normal NECK: No JVD LYMPHATICS: No lymphadenopathy CARDIAC: RRR, no murmurs, rubs, gallops RESPIRATORY: Bilateral expiratory wheezing ABDOMEN: Soft, non-tender, non-distended MUSCULOSKELETAL:  No edema; No deformity  SKIN: Warm and dry NEUROLOGIC:  Alert and oriented x 3 PSYCHIATRIC:  Normal affect   ASSESSMENT:    1. Atrial fibrillation, unspecified type ([place])   2. Essential hypertension   3. Hyperlipidemia, unspecified hyperlipidemia type   4. Tobacco use    PLAN:    In order of problems listed above:  Atrial fibrillation:  On diltiazem 120 mg and Eliquis 5 mg twice daily.  CHADS-VASc 4 (HTN, agex2, female).  TTE from 12/2018 shows normal EF, no significant valvular disease, moderate diastolic dysfunction.  Amiodarone discontinued 03/2019 due to risk of pulmonary toxicity given her COPD -Since she does not have symptoms when in atrial fibrillation, will check monitor to confirm no recurrence of AF since discontinuing amiodarone -Continue diltiazem for rate control -Continue Eliquis for anticoagulation  Hypertension: On diltiazem 120 mg.  Appears controlled, BP soft today.  Reports his BP has been around 120  Hyperlipidemia: On atorvastatin 10 mg.  LDL 86.  Tobacco use: smoked x 30 years for 1 ppd.  Currently is down to 3 cigarettes per day.  Risks of tobacco use were discussed and cessation strongly encouraged  Leg pain: ABIs normal, no evidence of PAD  COPD: on home oxygen.  Follows with pulmonology.  RTC in 3 months   Medication Adjustments/Labs and Tests Ordered: Current medicines are reviewed at length with the patient today.  Concerns regarding medicines are outlined above.  Orders Placed This Encounter  Procedures  . LONG TERM MONITOR (3-14 DAYS)   No orders of the defined types were placed in this encounter.   Patient Instructions  Medication Instructions:  Your Physician recommend you continue on your current medication as directed.    *If you need a refill on your cardiac medications before your next appointment, please call your pharmacy*  Lab Work: None  Testing/Procedures: Our physician has recommended that you wear an 14  DAY ZIO-PATCH monitor. The Zio patch cardiac monitor continuously records heart rhythm data for up to 14 days, this is for patients being evaluated for multiple types heart rhythms. For the first 24 hours post application, please avoid getting the Zio monitor wet in the shower or by excessive sweating during exercise. After that, feel free to carry on with regular  activities. Keep soaps and lotions away from the ZIO XT Patch.   Someone from our office will call to mail monitor.      Follow-Up: At [facility], you and your health needs are our priority.  As part of our continuing mission to provide you with exceptional heart care, we have created designated Provider Care Teams.  These Care Teams include your primary Cardiologist (physician) and Advanced Practice Providers (APPs -  Physician Assistants and Nurse Practitioners) who all work together to provide you with the care you need, when you need it.  Your next appointment:   6 month(s)  The format for your next appointment:   In Person  Provider:   Epifanio Lesches, MD  Christena Deem- Long Term Monitor Instructions   Your physician has requested you wear your ZIO patch monitor___14____days.   This is a single patch monitor.  [employer] supplies one patch monitor per enrollment.  Additional stickers are not available.   Please do not apply patch if you will be having a Nuclear Stress Test, Echocardiogram, Cardiac CT, MRI, or Chest Xray during the time frame you would be wearing the monitor. The patch cannot be worn during these tests.  You cannot remove and re-apply the ZIO XT patch monitor.   Your ZIO patch monitor will be sent USPS Priority mail from [facility] directly to your home address. The monitor may also be mailed to a PO BOX if home delivery is not available.   It may take 3-5 days to receive your monitor after you have been enrolled.   Once you have received you monitor, please review enclosed instructions.  Your monitor has already been registered assigning a specific monitor serial # to you.   Applying the monitor   Shave hair from upper left chest.   Hold abrader disc by orange tab.  Rub abrader in 40 strokes over left upper chest as indicated in your monitor instructions.   Clean area with 4 enclosed alcohol pads .  Use all pads to assure are is cleaned thoroughly.   Let dry.   Apply patch as indicated in monitor instructions.  Patch will be place under  collarbone on left side of chest with arrow pointing upward.   Rub patch adhesive wings for 2 minutes.Remove white label marked "1".  Remove white label marked "2".  Rub patch adhesive wings for 2 additional minutes.   While looking in a mirror, press and release button in center of patch.  A small green light will flash 3-4 times .  This will be your only indicator the monitor has been turned on.     Do not shower for the first 24 hours.  You may shower after the first 24 hours.   Press button if you feel a symptom. You will hear a small click.  Record Date, Time and Symptom in the Patient Log Book.   When you are ready to remove patch, follow instructions on last 2 pages of Patient Log Book.  Stick patch monitor onto last page of Patient Log Book.   Place Patient Log Book in [place] box.  Use locking tab on box and tape box closed securely.  The [employer] has [employer] on it.  Please place in mailbox as soon as possible.  Your physician should have your test results approximately 7 days after the monitor has been mailed back to [facility].   Call [facility] Customer Care at [phone] if you have questions regarding your ZIO XT patch monitor.  Call them immediately if you see an orange light blinking on your monitor.   If your monitor falls off in less than 4 days contact our Monitor department at [phone].  If your monitor becomes loose or falls off after 4 days call [employer] at [phone] for suggestions on securing your monitor.       Signed, Little Ishikawa, MD  06/29/2019 1:09 PM    [facility]

## 2019-07-01 ENCOUNTER — Other Ambulatory Visit (INDEPENDENT_AMBULATORY_CARE_PROVIDER_SITE_OTHER): Payer: Medicare Other

## 2019-07-01 DIAGNOSIS — I10 Essential (primary) hypertension: Secondary | ICD-10-CM

## 2019-07-01 DIAGNOSIS — E785 Hyperlipidemia, unspecified: Secondary | ICD-10-CM

## 2019-07-01 DIAGNOSIS — I4891 Unspecified atrial fibrillation: Secondary | ICD-10-CM

## 2019-07-02 ENCOUNTER — Ambulatory Visit (INDEPENDENT_AMBULATORY_CARE_PROVIDER_SITE_OTHER): Payer: Medicare Other

## 2019-07-02 DIAGNOSIS — I4891 Unspecified atrial fibrillation: Secondary | ICD-10-CM

## 2019-07-06 ENCOUNTER — Encounter: Payer: Self-pay | Admitting: Physical Therapy

## 2019-07-06 ENCOUNTER — Ambulatory Visit: Payer: Medicare Other | Attending: Family Medicine | Admitting: Physical Therapy

## 2019-07-06 ENCOUNTER — Other Ambulatory Visit: Payer: Self-pay

## 2019-07-06 DIAGNOSIS — R29898 Other symptoms and signs involving the musculoskeletal system: Secondary | ICD-10-CM | POA: Diagnosis not present

## 2019-07-06 DIAGNOSIS — R2689 Other abnormalities of gait and mobility: Secondary | ICD-10-CM | POA: Diagnosis not present

## 2019-07-06 NOTE — Therapy (Signed)
[facility] [address] [place], [place], [address] Phone: [phone]   Fax:  [phone]  Physical Therapy Evaluation  Patient Details  Name: Tiffany Jennings MRN: [id number] Date of Birth: 04-22-1935 Referring Provider (PT): Dr Nadene Rubins   Encounter Date: 07/06/2019  PT End of Session - 07/06/19 1128    Visit Number  1    Number of Visits  16    Date for PT Re-Evaluation  08/31/19    PT Start Time  1128    PT Stop Time  1219    PT Time Calculation (min)  51 min    Activity Tolerance  Patient tolerated treatment well    Behavior During Therapy  [facility] for tasks assessed/performed       Past Medical History:  Diagnosis Date  . A-fib (HCC)   . COPD (chronic obstructive pulmonary disease) (HCC)   . Hyperlipidemia   . Hypertension     Past Surgical History:  Procedure Laterality Date  . CAROTID ANGIOGRAM      There were no vitals filed for this visit.   Subjective Assessment - 07/06/19 1128    Subjective  Pt reports she is here for everything.  State she doesn't have a real good sense of equilibruim.  She said she is strong in her arms however her legs are weak.  She has spent the last 6 months in bed after moving here from [place].  She also states she doesn't have a lot of initiative.  Now lives in a residential neighborhood and is trying to walk twelve houses once a day.    Pertinent History  on 2L oxyegen for a couple of yrs, uses rollator when outside of the house, currently on heart monitor for the next two weeks.    How long can you stand comfortably?  10'    Patient Stated Goals  walk without an assistive device all the time ( has used one outside of home for last 1-2 yrs) assist with dog if able    Currently in Pain?  No/denies   does reports intermittent Rt shoulder/deltoid pain        [facility] PT Assessment - 07/06/19 0001      Assessment   Medical Diagnosis  Gait instability    Referring  Provider (PT)  Dr Nadene Rubins    Onset Date/Surgical Date  01/02/18    Next MD Visit  PRN    Prior Therapy  not for this       Precautions   Precautions  None    Precaution Comments  on 2L oxygen    Required Braces or Orthoses  --   no smoking - does do some of this - takes o2 off     Balance Screen   Has the patient fallen in the past 6 months  No    Has the patient had a decrease in activity level because of a fear of falling?   No   pt is aware of the possiblity of falling   Is the patient reluctant to leave their home because of a fear of falling?   No      Home Nurse, mental health  Private residence    Living Arrangements  Children    Home Access  Stairs to enter   3     Prior Function   Level of Independence  Independent    Vocation  Retired      Psychologist, clinical  Tests   Functional tests  Sit to Stand      Sit to Stand   Comments  braces with legs together      ROM / Strength   AROM / PROM / Strength  Strength;AROM      AROM   AROM Assessment Site  Hip;Ankle    Right/Left Ankle  --   bilat DF 5     Strength   Strength Assessment Site  Hip;Knee;Ankle    Right/Left Hip  --   grossly 4+/5   Right/Left Knee  --   5/5   Right/Left Ankle  --   5/5     Bed Mobility   Bed Mobility  --   I however SOB, sats 94%      Standardized Balance Assessment   Standardized Balance Assessment  Dynamic Gait Index      Dynamic Gait Index   Level Surface  Mild Impairment    Change in Gait Speed  Mild Impairment    Gait with Horizontal Head Turns  Moderate Impairment    Gait with Vertical Head Turns  Mild Impairment    Gait and Pivot Turn  Mild Impairment    Step Over Obstacle  Mild Impairment    Step Around Obstacles  Normal    Steps  Moderate Impairment    Total Score  15    DGI comment:  O2 sats varied from 94% - 92                Objective measurements completed on examination: See above findings.      [facility] Adult PT Treatment/Exercise -  07/06/19 0001      Exercises   Exercises  Knee/Hip      Knee/Hip Exercises: Stretches   Gastroc Stretch  Both;2 reps;30 seconds      Knee/Hip Exercises: Standing   Other Standing Knee Exercises  side stepping and BWD walking      Knee/Hip Exercises: Seated   Sit to Sand  10 reps;without UE support             PT Education - 07/06/19 1328    Education Details  POC, HEP    Person(s) Educated  Patient    Methods  Explanation    Comprehension  Verbalized understanding;Returned demonstration       PT Short Term Goals - 07/06/19 1335      PT SHORT TERM GOAL #1   Title  I with initial HEP ( 08/03/2019)    Time  4    Period  Weeks    Status  New    Target Date  08/03/19        PT Long Term Goals - 07/06/19 1336      PT LONG TERM GOAL #1   Title  I with advanced HEP to include a walking program ( 08/31/2019)    Time  8    Period  Weeks    Status  New    Target Date  08/31/19      PT LONG TERM GOAL #2   Title  improve functional LE strength to allow transfers sit to stand without needed to brace legs together or lean on chair ( 08/31/2019)    Time  8    Period  Weeks    Status  New    Target Date  08/31/19      PT LONG TERM GOAL #3   Title  ambulate in the community without SOB or fatigue keeping sats above  92% with least restrictive assistive device ( 08/31/2019)    Time  8    Period  Weeks    Status  New    Target Date  08/31/19      PT LONG TERM GOAL #4   Title  improve bilat dorsiflexion =/>degrees to allow her to use ankle strategies for balance ( 08/31/2019)    Time  8    Period  Weeks    Status  New    Target Date  08/31/19      PT LONG TERM GOAL #5   Title  improve DGI =/> 20/24 to decrease risk of falls. ( 08/31/2019)    Time  8    Period  Weeks    Status  New    Target Date  08/31/19             Plan - 07/06/19 1329    Clinical Impression Statement  85 yo female recently relocated to the area.  Reports she was living in a hotel until  they for a house for several months and this lead to decreased activity and weakness.  She has a rollator that she walks with when out of the house, on 2L oxygen and living with her daughter in a one level house with entry steps.  She has functional lower extremety weakness demo'd with needing to brace legs together to stand and fatiuge with standing activities.  Tested pretty good with break test for muscle strength.  She is limited in bilat ankle DF and is a fall risk per her DGI score of 15/24.  She would benefit from PT to improve functional mobility , facilitate safe ambulattion as she will be alone at home in Feb for 4 hrs while daughter returns to work and decrease her fall risk.    Personal Factors and Comorbidities  Comorbidity 3+;Past/Current Experience;Behavior Pattern;Age    Comorbidities  a-fib - currently on 2 wk monitor, COPD - on oxygen, HTN, depression    Examination-Activity Limitations  Other;Locomotion Level;Squat;Stairs;Stand    Examination-Participation Restrictions  Community Activity;Other    Stability/Clinical Decision Making  Evolving/Moderate complexity    Clinical Decision Making  Moderate    Rehab Potential  Good    PT Frequency  2x / week    PT Duration  8 weeks    PT Treatment/Interventions  ADLs/Self Care Home Management;Gait training;Taping;Patient/family education;Functional mobility training;Moist Heat;Stair training;Ultrasound;Therapeutic activities;Passive range of motion;Therapeutic exercise;Cryotherapy;Psychologist, educational;Neuromuscular re-education;Manual techniques    PT Next Visit Plan  functional lower body strengthening, balance training    Consulted and Agree with Plan of Care  Patient;Family member/caregiver    Family Member Consulted  daughter       Patient will benefit from skilled therapeutic intervention in order to improve the following deficits and impairments:  Decreased range of motion, Difficulty walking, Pain, Decreased  endurance, Decreased activity tolerance, Decreased strength  Visit Diagnosis: Other abnormalities of gait and mobility - Plan: PT plan of care cert/re-cert  Other symptoms and signs involving the musculoskeletal system - Plan: PT plan of care cert/re-cert     Problem List Patient Active Problem List   Diagnosis Date Noted  . Gait instability 06/22/2019  . Elevated glucose 06/22/2019  . Depression, recurrent (HCC) 03/06/2019  . Atrial fibrillation (HCC) 03/06/2019  . Essential hypertension 03/06/2019  . Elevated cholesterol 03/06/2019  . Hypothyroidism 03/06/2019    Antony Odea erPT  07/06/2019, 1:41 PM  [facility] [address]  [place], [place], [address] Phone: [phone]   Fax:  [phone]  Name: Kennadee Walthour MRN: [id number] Date of Birth: 1935-02-04

## 2019-07-06 NOTE — Patient Instructions (Signed)
Access Code: [email or web address]  URL: [email or web address]  Date: 07/06/2019  Prepared by: Roderic Scarce   Exercises Gastroc Stretch on Wall - 3 reps - 30 hold - 1x daily Backwards Walking - 5 sets - 2x daily Sideways Walking - 5 sets Sit to Stand without Arm Support - 10 reps - 3 sets - 1x daily

## 2019-07-09 ENCOUNTER — Other Ambulatory Visit: Payer: Self-pay

## 2019-07-09 ENCOUNTER — Ambulatory Visit: Payer: Medicare Other | Admitting: Physical Therapy

## 2019-07-09 DIAGNOSIS — R29898 Other symptoms and signs involving the musculoskeletal system: Secondary | ICD-10-CM

## 2019-07-09 DIAGNOSIS — R2689 Other abnormalities of gait and mobility: Secondary | ICD-10-CM | POA: Diagnosis not present

## 2019-07-09 NOTE — Therapy (Signed)
[facility] [address] [place], [place], [address] Phone: [phone]   Fax:  [phone]  Physical Therapy Treatment  Patient Details  Name: Tiffany Jennings MRN: [id number] Date of Birth: 12-27-1934 Referring Provider (PT): Dr Nadene Rubins   Encounter Date: 07/09/2019  PT End of Session - 07/09/19 1437    Visit Number  2    Number of Visits  16    Date for PT Re-Evaluation  08/31/19    PT Start Time  1355    PT Stop Time  1440    PT Time Calculation (min)  45 min       Past Medical History:  Diagnosis Date  . A-fib (HCC)   . COPD (chronic obstructive pulmonary disease) (HCC)   . Hyperlipidemia   . Hypertension     Past Surgical History:  Procedure Laterality Date  . CAROTID ANGIOGRAM      There were no vitals filed for this visit.  Subjective Assessment - 07/09/19 1412    Subjective  sore from HEP- I have questions about it.    Currently in Pain?  No/denies                       [facility] Adult PT Treatment/Exercise - 07/09/19 0001      Exercises   Exercises  Shoulder;Lumbar;Knee/Hip      Knee/Hip Exercises: Standing   Hip Flexion  Stengthening;Both;10 reps;Knee bent;Knee straight   UE support with walker   Hip Abduction  Stengthening;Both;10 reps;Knee straight   UE support with RW     Knee/Hip Exercises: Seated   Long Arc Quad  Strengthening;Both;2 sets;10 reps;Weights    Long Arc Quad Weight  2 lbs.    Ball Squeeze  10 x    Marching  Strengthening;Both;2 sets;10 reps;Weights    Marching Weights  2 lbs.    Hamstring Curl  Strengthening;Both;2 sets;10 reps   red tabdn   Abduction/Adduction   Strengthening;Both;2 sets;10 reps;Weights    Abd/Adduction Weights  2 lbs.      Shoulder Exercises: Seated   Other Seated Exercises  shld ext, row and ER 2 sets 10 red tband             PT Education - 07/09/19 1416    Education Details  reviewed est HEP at eval - pt was doing  correctly just doing too many "sets"    Person(s) Educated  Patient    Methods  Explanation;Demonstration;Verbal cues    Comprehension  Verbalized understanding       PT Short Term Goals - 07/06/19 1335      PT SHORT TERM GOAL #1   Title  I with initial HEP ( 08/03/2019)    Time  4    Period  Weeks    Status  New    Target Date  08/03/19        PT Long Term Goals - 07/06/19 1336      PT LONG TERM GOAL #1   Title  I with advanced HEP to include a walking program ( 08/31/2019)    Time  8    Period  Weeks    Status  New    Target Date  08/31/19      PT LONG TERM GOAL #2   Title  improve functional LE strength to allow transfers sit to stand without needed to brace legs together or lean on chair ( 08/31/2019)    Time  8    Period  Weeks    Status  New    Target Date  08/31/19      PT LONG TERM GOAL #3   Title  ambulate in the community without SOB or fatigue keeping sats above 92% with least restrictive assistive device ( 08/31/2019)    Time  8    Period  Weeks    Status  New    Target Date  08/31/19      PT LONG TERM GOAL #4   Title  improve bilat dorsiflexion =/>degrees to allow her to use ankle strategies for balance ( 08/31/2019)    Time  8    Period  Weeks    Status  New    Target Date  08/31/19      PT LONG TERM GOAL #5   Title  improve DGI =/> 20/24 to decrease risk of falls. ( 08/31/2019)    Time  8    Period  Weeks    Status  New    Target Date  08/31/19            Plan - 07/09/19 1438    Clinical Impression Statement  explaiened and reviewed HEP , pt was doing correctly just too many. pt with increased SOB and freq short rest needed, O2 at time decreased to 90%. Pt concerned about copay and wants to many decrease to 1 x week with HEP, discussed with pt slow progression with adding HEP to assure no over doingand compliace, she and daughter agreed.    PT Treatment/Interventions  ADLs/Self Care Home Management;Gait training;Taping;Patient/family  education;Functional mobility training;Moist Heat;Stair training;Ultrasound;Therapeutic activities;Passive range of motion;Therapeutic exercise;Cryotherapy;Scientist, product/process development;Neuromuscular re-education;Manual techniques    PT Next Visit Plan  Assess how pt felt after this tx, add to HEP seated LAQ, hip flex and abd, stanidng ex with RW and scap stab with tband       Patient will benefit from skilled therapeutic intervention in order to improve the following deficits and impairments:  Decreased range of motion, Difficulty walking, Pain, Decreased endurance, Decreased activity tolerance, Decreased strength  Visit Diagnosis: Other abnormalities of gait and mobility  Other symptoms and signs involving the musculoskeletal system     Problem List Patient Active Problem List   Diagnosis Date Noted  . Gait instability 06/22/2019  . Elevated glucose 06/22/2019  . Depression, recurrent ([place]) 03/06/2019  . Atrial fibrillation ([place]) 03/06/2019  . Essential hypertension 03/06/2019  . Elevated cholesterol 03/06/2019  . Hypothyroidism 03/06/2019    Jru Pense,ANGIE PTA 07/09/2019, 2:41 PM  [place], [place], [address] Phone: [phone]   Fax:  [phone]  Name: Tiffany Jennings MRN: [id number] Date of Birth: 08/26/1934

## 2019-07-13 ENCOUNTER — Ambulatory Visit: Payer: Self-pay | Admitting: Physical Therapy

## 2019-07-16 ENCOUNTER — Encounter: Payer: Self-pay | Admitting: Physical Therapy

## 2019-07-16 ENCOUNTER — Ambulatory Visit: Payer: Medicare Other | Admitting: Physical Therapy

## 2019-07-16 ENCOUNTER — Other Ambulatory Visit: Payer: Self-pay

## 2019-07-16 DIAGNOSIS — R2689 Other abnormalities of gait and mobility: Secondary | ICD-10-CM | POA: Diagnosis not present

## 2019-07-16 DIAGNOSIS — R29898 Other symptoms and signs involving the musculoskeletal system: Secondary | ICD-10-CM | POA: Diagnosis not present

## 2019-07-16 NOTE — Therapy (Signed)
[place], [place], [address] Phone: [phone]   Fax:  [phone]  Physical Therapy Treatment  Patient Details  Name: Tiffany Jennings MRN: [id number] Date of Birth: 26-Dec-1934 Referring Provider (PT): Dr Abelino Derrick   Encounter Date: 07/16/2019  PT End of Session - 07/16/19 1234    Visit Number  3    Number of Visits  16    Date for PT Re-Evaluation  08/31/19    PT Start Time  6962    PT Stop Time  1230    PT Time Calculation (min)  45 min    Activity Tolerance  Patient tolerated treatment well    Behavior During Therapy  [facility] for tasks assessed/performed       Past Medical History:  Diagnosis Date  . A-fib ([place])   . COPD (chronic obstructive pulmonary disease) ([place])   . Hyperlipidemia   . Hypertension     Past Surgical History:  Procedure Laterality Date  . CAROTID ANGIOGRAM      There were no vitals filed for this visit.  Subjective Assessment - 07/16/19 1146    Subjective  Pt reports that she feel fine today    Currently in Pain?  No/denies                       [facility] Adult PT Treatment/Exercise - 07/16/19 0001      Lumbar Exercises: Standing   Row  Strengthening;Theraband;20 reps    Theraband Level (Row)  Level 2 (Red)    Shoulder Extension  Strengthening;Theraband;20 reps    Theraband Level (Shoulder Extension)  Level 2 (Red)    Other Standing Lumbar Exercises  Standing march with rolator 2x10       Knee/Hip Exercises: Seated   Long Arc Quad  Strengthening;Both;2 sets;10 reps;Weights    Long Arc Quad Weight  2 lbs.    Hamstring Curl  Strengthening;Both;2 sets;10 reps    Hamstring Limitations  green Tband     Sit to Sand  2 sets;5 reps;without UE support   from rolator seat              PT Short Term Goals - 07/06/19 1335      PT SHORT TERM GOAL #1   Title  I with initial HEP ( 08/03/2019)    Time  4    Period  Weeks    Status  New     Target Date  08/03/19        PT Long Term Goals - 07/06/19 1336      PT LONG TERM GOAL #1   Title  I with advanced HEP to include a walking program ( 08/31/2019)    Time  8    Period  Weeks    Status  New    Target Date  08/31/19      PT LONG TERM GOAL #2   Title  improve functional LE strength to allow transfers sit to stand without needed to brace legs together or lean on chair ( 08/31/2019)    Time  8    Period  Weeks    Status  New    Target Date  08/31/19      PT LONG TERM GOAL #3   Title  ambulate in the community without SOB or fatigue keeping sats above 92% with least restrictive assistive device ( 08/31/2019)    Time  8    Period  Weeks    Status  New    Target Date  08/31/19      PT LONG TERM GOAL #4   Title  improve bilat dorsiflexion =/>degrees to allow her to use ankle strategies for balance ( 08/31/2019)    Time  8    Period  Weeks    Status  New    Target Date  08/31/19      PT LONG TERM GOAL #5   Title  improve DGI =/> 20/24 to decrease risk of falls. ( 08/31/2019)    Time  8    Period  Weeks    Status  New    Target Date  08/31/19            Plan - 07/16/19 1235    Clinical Impression Statement  updated and reviewed HEP. Pt is very deconditioned needing multiple rest breaks. She is on 2L O2, SAT levels were 89-93% throughout session. Pt daughter present and assured compliance to HEP. LE did fatigue quick with LAQ and HS curls.    Personal Factors and Comorbidities  Comorbidity 3+;Past/Current Experience;Behavior Pattern;Age    Comorbidities  a-fib - currently on 2 wk monitor, COPD - on oxygen, HTN, depression    Stability/Clinical Decision Making  Evolving/Moderate complexity    Rehab Potential  Good    PT Frequency  2x / week    PT Duration  8 weeks    PT Treatment/Interventions  ADLs/Self Care Home Management;Gait training;Taping;Patient/family education;Functional mobility training;Moist Heat;Stair training;Ultrasound;Therapeutic  activities;Passive range of motion;Therapeutic exercise;Cryotherapy;Psychologist, educational;Neuromuscular re-education;Manual techniques    PT Next Visit Plan  functional endurance and LE strengthening       Patient will benefit from skilled therapeutic intervention in order to improve the following deficits and impairments:  Decreased range of motion, Difficulty walking, Pain, Decreased endurance, Decreased activity tolerance, Decreased strength  Visit Diagnosis: Other abnormalities of gait and mobility     Problem List Patient Active Problem List   Diagnosis Date Noted  . Gait instability 06/22/2019  . Elevated glucose 06/22/2019  . Depression, recurrent (HCC) 03/06/2019  . Atrial fibrillation (HCC) 03/06/2019  . Essential hypertension 03/06/2019  . Elevated cholesterol 03/06/2019  . Hypothyroidism 03/06/2019    Grayce Sessions, PTA 07/16/2019, 12:38 PM  [facility] [address] [place], [place], [address] Phone: [phone]   Fax:  [phone]  Name: Tiffany Jennings MRN: [id number] Date of Birth: 1934-07-16

## 2019-07-16 NOTE — Patient Instructions (Signed)
Access Code: [email or web address]  URL: [email or web address]  Date: 07/16/2019  Prepared by: Debroah Baller   Exercises Shoulder extension with resistance - Neutral - 10 reps - 3 sets - 1x daily - 7x weekly Standing Bilateral Low Shoulder Row with Anchored Resistance - 10 reps - 3 sets - 1x daily - 7x weekly Standing March with Counter Support - 10 reps - 3 sets - 1x daily - 7x weekly

## 2019-07-19 DIAGNOSIS — J449 Chronic obstructive pulmonary disease, unspecified: Secondary | ICD-10-CM | POA: Diagnosis not present

## 2019-07-27 ENCOUNTER — Encounter: Payer: Self-pay | Admitting: Physical Therapy

## 2019-07-27 ENCOUNTER — Ambulatory Visit: Payer: Medicare Other | Attending: Family Medicine | Admitting: Physical Therapy

## 2019-07-27 ENCOUNTER — Other Ambulatory Visit: Payer: Self-pay

## 2019-07-27 DIAGNOSIS — R29898 Other symptoms and signs involving the musculoskeletal system: Secondary | ICD-10-CM | POA: Diagnosis not present

## 2019-07-27 DIAGNOSIS — R2689 Other abnormalities of gait and mobility: Secondary | ICD-10-CM | POA: Diagnosis not present

## 2019-07-27 NOTE — Therapy (Signed)
[facility] [address] [place], [place], [address] Phone: [phone]   Fax:  [phone]  Physical Therapy Treatment  Patient Details  Name: Tiffany Jennings MRN: [id number] Date of Birth: 12-Dec-1934 Referring Provider (PT): Dr Nadene Rubins   Encounter Date: 07/27/2019  PT End of Session - 07/27/19 1146    Visit Number  4    Date for PT Re-Evaluation  08/31/19    PT Start Time  1100    PT Stop Time  1145    PT Time Calculation (min)  45 min    Activity Tolerance  Patient limited by fatigue    Behavior During Therapy  [facility] for tasks assessed/performed       Past Medical History:  Diagnosis Date  . A-fib (HCC)   . COPD (chronic obstructive pulmonary disease) (HCC)   . Hyperlipidemia   . Hypertension     Past Surgical History:  Procedure Laterality Date  . CAROTID ANGIOGRAM      There were no vitals filed for this visit.  Subjective Assessment - 07/27/19 1101    Subjective  "Pretty good"    Currently in Pain?  Yes    Pain Score  3     Pain Orientation  Right;Upper                       [facility] Adult PT Treatment/Exercise - 07/27/19 0001      Lumbar Exercises: Aerobic   Nustep  L3 x 4 min       Lumbar Exercises: Seated   Other Seated Lumbar Exercises  Seated OHP yellow ball 2x10       Knee/Hip Exercises: Seated   Long Arc Quad  Strengthening;Both;2 sets;10 reps;Weights    Long Arc Quad Weight  3 lbs.    Marching  Strengthening;Both;2 sets;10 reps;Weights    Marching Weights  3 lbs.    Hamstring Curl  Strengthening;Both;2 sets;15 reps    Hamstring Limitations  green Tband     Sit to Sand  3 sets;10 reps;5 reps   3rd set x reps holding yellow ball               PT Short Term Goals - 07/06/19 1335      PT SHORT TERM GOAL #1   Title  I with initial HEP ( 08/03/2019)    Time  4    Period  Weeks    Status  New    Target Date  08/03/19        PT Long Term Goals -  07/06/19 1336      PT LONG TERM GOAL #1   Title  I with advanced HEP to include a walking program ( 08/31/2019)    Time  8    Period  Weeks    Status  New    Target Date  08/31/19      PT LONG TERM GOAL #2   Title  improve functional LE strength to allow transfers sit to stand without needed to brace legs together or lean on chair ( 08/31/2019)    Time  8    Period  Weeks    Status  New    Target Date  08/31/19      PT LONG TERM GOAL #3   Title  ambulate in the community without SOB or fatigue keeping sats above 92% with least restrictive assistive device ( 08/31/2019)    Time  8  Period  Weeks    Status  New    Target Date  08/31/19      PT LONG TERM GOAL #4   Title  improve bilat dorsiflexion =/>degrees to allow her to use ankle strategies for balance ( 08/31/2019)    Time  8    Period  Weeks    Status  New    Target Date  08/31/19      PT LONG TERM GOAL #5   Title  improve DGI =/> 20/24 to decrease risk of falls. ( 08/31/2019)    Time  8    Period  Weeks    Status  New    Target Date  08/31/19            Plan - 07/27/19 1146    Clinical Impression Statement  Pt is limited by fatigue. She is on 2L O2, Frequent rest needed between all sets and exercises. She did well with all the interventions. Functional activities are more taxing on PT. O2 sated ranged from 88 to 96%    Comorbidities  a-fib - currently on 2 wk monitor, COPD - on oxygen, HTN, depression    Examination-Activity Limitations  Other;Locomotion Level;Squat;Stairs;Stand    Examination-Participation Restrictions  Community Activity;Other    Stability/Clinical Decision Making  Evolving/Moderate complexity    Rehab Potential  Good    PT Frequency  2x / week    PT Duration  8 weeks    PT Treatment/Interventions  ADLs/Self Care Home Management;Gait training;Taping;Patient/family education;Functional mobility training;Moist Heat;Stair training;Ultrasound;Therapeutic activities;Passive range of  motion;Therapeutic exercise;Cryotherapy;Scientist, product/process development;Neuromuscular re-education;Manual techniques    PT Next Visit Plan  functional endurance and LE strengthening       Patient will benefit from skilled therapeutic intervention in order to improve the following deficits and impairments:  Decreased range of motion, Difficulty walking, Pain, Decreased endurance, Decreased activity tolerance, Decreased strength  Visit Diagnosis: Other symptoms and signs involving the musculoskeletal system  Other abnormalities of gait and mobility     Problem List Patient Active Problem List   Diagnosis Date Noted  . Gait instability 06/22/2019  . Elevated glucose 06/22/2019  . Depression, recurrent ([place]) 03/06/2019  . Atrial fibrillation ([place]) 03/06/2019  . Essential hypertension 03/06/2019  . Elevated cholesterol 03/06/2019  . Hypothyroidism 03/06/2019    Scot Jun, PTA 07/27/2019, 11:51 AM  [place], [place], [address] Phone: [phone]   Fax:  [phone]  Name: Tiffany Jennings MRN: [id number] Date of Birth: 01-03-1935

## 2019-07-28 DIAGNOSIS — I4891 Unspecified atrial fibrillation: Secondary | ICD-10-CM | POA: Diagnosis not present

## 2019-07-30 ENCOUNTER — Ambulatory Visit: Payer: Medicare Other | Admitting: Physical Therapy

## 2019-08-03 ENCOUNTER — Other Ambulatory Visit: Payer: Self-pay

## 2019-08-03 ENCOUNTER — Encounter: Payer: Self-pay | Admitting: Physical Therapy

## 2019-08-03 ENCOUNTER — Ambulatory Visit: Payer: Medicare Other | Admitting: Physical Therapy

## 2019-08-03 DIAGNOSIS — R29898 Other symptoms and signs involving the musculoskeletal system: Secondary | ICD-10-CM | POA: Diagnosis not present

## 2019-08-03 DIAGNOSIS — R2689 Other abnormalities of gait and mobility: Secondary | ICD-10-CM

## 2019-08-03 NOTE — Patient Instructions (Signed)
Access Code: [email or web address]  URL: [email or web address]  Date: 08/03/2019  Prepared by: Debroah Baller   Exercises Sitting Knee Extension with Resistance - 10 reps - 3 sets - 1x daily - 7x weekly Seated Hamstring Curl with Anchored Resistance - 10 reps - 3 sets - 1x daily - 7x weekly

## 2019-08-03 NOTE — Therapy (Signed)
[facility] [place] [facility] [address] [place], [place], [address] Phone: [phone]   Fax:  [phone]  Physical Therapy Treatment  Patient Details  Name: Tiffany Jennings MRN: [id number] Date of Birth: [birth year] Referring Provider (PT): Dr Nadene Rubins   Encounter Date: 08/03/2019  PT End of Session - 08/03/19 1148    Visit Number  5    Number of Visits  16    Date for PT Re-Evaluation  08/31/19    PT Start Time  1100    PT Stop Time  1145    PT Time Calculation (min)  45 min    Activity Tolerance  Patient limited by fatigue    Behavior During Therapy  [facility] for tasks assessed/performed       Past Medical History:  Diagnosis Date  . A-fib (HCC)   . COPD (chronic obstructive pulmonary disease) (HCC)   . Hyperlipidemia   . Hypertension     Past Surgical History:  Procedure Laterality Date  . CAROTID ANGIOGRAM      There were no vitals filed for this visit.  Subjective Assessment - 08/03/19 1104    Subjective  Pt reports that she feel a little unsure today, maybe her balance is off     No Pain                  [facility] Adult PT Treatment/Exercise - 08/03/19 0001      Lumbar Exercises: Aerobic   UBE (Upper Arm Bike)  L2 2 min each way     Nustep  L3 x 5 min Dropped to 86% O2     Other Aerobic Exercise  Ambulation for aerobic endurance, Gait 120 ft befoir needing to rest, then for 80 ft. O2 dropped to 87% befor recovering to 94% approx. after 3 minuted       Knee/Hip Exercises: Seated   Long Arc Quad  Strengthening;Both;2 sets;Weights;15 reps    Long Arc Quad Weight  3 lbs.    Hamstring Curl  Strengthening;Both;2 sets;15 reps    Hamstring Limitations  green Tband                PT Short Term Goals - 07/06/19 1335      PT SHORT TERM GOAL #1   Title  I with initial HEP ( 08/03/2019)    Time  4    Period  Weeks    Status  New    Target Date  08/03/19        PT Long Term Goals -  07/06/19 1336      PT LONG TERM GOAL #1   Title  I with advanced HEP to include a walking program ( 08/31/2019)    Time  8    Period  Weeks    Status  New    Target Date  08/31/19      PT LONG TERM GOAL #2   Title  improve functional LE strength to allow transfers sit to stand without needed to brace legs together or lean on chair ( 08/31/2019)    Time  8    Period  Weeks    Status  New    Target Date  08/31/19      PT LONG TERM GOAL #3   Title  ambulate in the community without SOB or fatigue keeping sats above 92% with least restrictive assistive device ( 08/31/2019)    Time  8    Period  Weeks  Status  New    Target Date  08/31/19      PT LONG TERM GOAL #4   Title  improve bilat dorsiflexion =/>degrees to allow her to use ankle strategies for balance ( 08/31/2019)    Time  8    Period  Weeks    Status  New    Target Date  08/31/19      PT LONG TERM GOAL #5   Title  improve DGI =/> 20/24 to decrease risk of falls. ( 08/31/2019)    Time  8    Period  Weeks    Status  New    Target Date  08/31/19            Plan - 08/03/19 1149    Clinical Impression Statement  pt again limited by fatigue. Did more aerobic interventions today's focus on aerobic endurance. Increase fatigue throughout session requiring frequent rest. Cues for ROM and proper breathing sequence with leg extensions and curls. Pt on 2L O2, No AD with aerobic gait trials    Comorbidities  a-fib - currently on 2 wk monitor, COPD - on oxygen, HTN, depression    Examination-Activity Limitations  Other;Locomotion Level;Squat;Stairs;Stand    Examination-Participation Restrictions  Community Activity;Other    Stability/Clinical Decision Making  Evolving/Moderate complexity    Rehab Potential  Good    PT Duration  8 weeks    PT Treatment/Interventions  ADLs/Self Care Home Management;Gait training;Taping;Patient/family education;Functional mobility training;Moist Heat;Stair training;Ultrasound;Therapeutic  activities;Passive range of motion;Therapeutic exercise;Cryotherapy;Scientist, product/process development;Neuromuscular re-education;Manual techniques    PT Next Visit Plan  functional endurance and LE strengthening       Patient will benefit from skilled therapeutic intervention in order to improve the following deficits and impairments:  Decreased range of motion, Difficulty walking, Pain, Decreased endurance, Decreased activity tolerance, Decreased strength  Visit Diagnosis: Other symptoms and signs involving the musculoskeletal system  Other abnormalities of gait and mobility     Problem List Patient Active Problem List   Diagnosis Date Noted  . Gait instability 06/22/2019  . Elevated glucose 06/22/2019  . Depression, recurrent ([place]) 03/06/2019  . Atrial fibrillation ([place]) 03/06/2019  . Essential hypertension 03/06/2019  . Elevated cholesterol 03/06/2019  . Hypothyroidism 03/06/2019    Scot Jun, PTA 08/03/2019, 11:53 AM  [place], [place], [address] Phone: [phone]   Fax:  [phone]  Name: Tiffany Jennings MRN: [id number] Date of Birth: May 02, 1935

## 2019-08-06 ENCOUNTER — Ambulatory Visit: Payer: Medicare Other | Admitting: Physical Therapy

## 2019-08-07 ENCOUNTER — Other Ambulatory Visit: Payer: Self-pay | Admitting: *Deleted

## 2019-08-07 ENCOUNTER — Telehealth: Payer: Self-pay | Admitting: Cardiology

## 2019-08-07 DIAGNOSIS — I491 Atrial premature depolarization: Secondary | ICD-10-CM

## 2019-08-07 DIAGNOSIS — I471 Supraventricular tachycardia: Secondary | ICD-10-CM

## 2019-08-07 NOTE — Telephone Encounter (Addendum)
Returned call to daughter-reviewed monitor results (ok per DPR).  Patient did not remember what the results were.   After reviewing chart-per Dr. Campbell Lerner OV note, patient had echocardiogram last year prior to moving to [place] (notes scanned into media).   Advised daughter would verify with Dr. Bjorn Pippin that this is still recommended.      Daughter states patient is completely asymptomatic and denies any palpitations or dizziness/lightheadedness.

## 2019-08-07 NOTE — Telephone Encounter (Signed)
New message  Patient's daughter is calling in to go over monitor results. Please give patient's daughter a call back.

## 2019-08-09 NOTE — Telephone Encounter (Signed)
I would recommend proceeding with the echo.  She is having a lot of extra beats now, and my concern is that the amiodarone she was previously on could have been suppressing these beats, and now that she is off amio is having a lot more.  The extra beats are not concerning as long as heart pumping function is OK, so would recommend we confirm with echo

## 2019-08-10 ENCOUNTER — Encounter: Payer: Medicare Other | Admitting: Physical Therapy

## 2019-08-10 NOTE — Telephone Encounter (Signed)
Spoke to [place] (ok per DPR)-aware of recommendations and will proceed with Echo 2/24 as scheduled.

## 2019-08-12 ENCOUNTER — Other Ambulatory Visit: Payer: Self-pay

## 2019-08-12 ENCOUNTER — Ambulatory Visit (HOSPITAL_COMMUNITY): Payer: Medicare Other | Attending: Cardiology

## 2019-08-12 ENCOUNTER — Telehealth: Payer: Self-pay | Admitting: Cardiology

## 2019-08-12 DIAGNOSIS — I471 Supraventricular tachycardia: Secondary | ICD-10-CM | POA: Diagnosis not present

## 2019-08-12 DIAGNOSIS — I491 Atrial premature depolarization: Secondary | ICD-10-CM | POA: Diagnosis not present

## 2019-08-12 NOTE — Telephone Encounter (Signed)
Patient is requesting for her daughter, Verlon Au, to come with her to her appt. She states that Verlon Au is her caretaker.

## 2019-08-12 NOTE — Telephone Encounter (Signed)
Called and let pt know that she is not able to have visitors with her during her echocardiogram appt. Pt verbalized understanding.

## 2019-08-13 ENCOUNTER — Ambulatory Visit: Payer: Medicare Other | Admitting: Physical Therapy

## 2019-08-16 ENCOUNTER — Ambulatory Visit: Payer: Medicare Other | Attending: Internal Medicine

## 2019-08-16 DIAGNOSIS — J449 Chronic obstructive pulmonary disease, unspecified: Secondary | ICD-10-CM | POA: Diagnosis not present

## 2019-08-16 DIAGNOSIS — Z23 Encounter for immunization: Secondary | ICD-10-CM

## 2019-08-16 NOTE — Progress Notes (Signed)
   Covid-19 Vaccination Clinic  Name:  Tiffany Jennings    MRN: [id number] DOB: 1934/08/23  08/16/2019  [name] was observed post Covid-19 immunization for 15 minutes without incidence. She was provided with Vaccine Information Sheet and instruction to access the V-Safe system.   [name] was instructed to call 911 with any severe reactions post vaccine: Marland Kitchen Difficulty breathing  . Swelling of your face and throat  . A fast heartbeat  . A bad rash all over your body  . Dizziness and weakness    Immunizations Administered    Name Date Dose VIS Date Route   Pfizer COVID-19 Vaccine 08/16/2019  1:34 PM 0.3 mL 05/29/2019 Intramuscular   Manufacturer: [employer], [employer]   Lot: [id number]   NDC: [address]-6

## 2019-08-17 ENCOUNTER — Encounter: Payer: Self-pay | Admitting: *Deleted

## 2019-09-10 ENCOUNTER — Telehealth: Payer: Self-pay | Admitting: Pulmonary Disease

## 2019-09-10 NOTE — Telephone Encounter (Signed)
Spoke with patient. She stated that she is interested in switching from Spiriva Respimat 2.68mcg to Trelegy. She has an appointment with AO on 10/05/19 at 11am. She is interested in switching soon because she is almost out of her Spiriva and does not want to wait until her appt next month.   Pharmacy is [facility] on [address].   AO, please advise. Thanks!

## 2019-09-11 NOTE — Telephone Encounter (Signed)
Pt is out of medication that she uses now.

## 2019-09-11 NOTE — Telephone Encounter (Signed)
Spoke with the pt  I advised we are still awaiting Dr Trena Platt response Please advise, thanks

## 2019-09-11 NOTE — Telephone Encounter (Signed)
Pt calling back regarding Trelegy.  Please advise.  [phone]

## 2019-09-14 MED ORDER — TRELEGY ELLIPTA 100-62.5-25 MCG/INH IN AEPB: 1.0000 | INHALATION_SPRAY | Freq: Every day | RESPIRATORY_TRACT | 5 refills | Status: DC

## 2019-09-14 NOTE — Telephone Encounter (Signed)
Called and spoke with pt letting her know that AO was fine with starting her on Trelegy and pt verbalized understanding. rx for trelegy has been sent to preferred pharmacy. Nothing further needed.

## 2019-09-14 NOTE — Telephone Encounter (Signed)
Okay to start trelegy 100, 1INH daily

## 2019-09-15 ENCOUNTER — Ambulatory Visit: Payer: Medicare Other | Attending: Internal Medicine

## 2019-09-15 DIAGNOSIS — Z23 Encounter for immunization: Secondary | ICD-10-CM

## 2019-09-15 NOTE — Progress Notes (Signed)
   Covid-19 Vaccination Clinic  Name:  Tiffany Jennings    MRN: [id number] DOB: 05/02/1935  09/15/2019  [name] was observed post Covid-19 immunization for 15 minutes without incident. She was provided with Vaccine Information Sheet and instruction to access the V-Safe system.   [name] was instructed to call 911 with any severe reactions post vaccine: Marland Kitchen Difficulty breathing  . Swelling of face and throat  . A fast heartbeat  . A bad rash all over body  . Dizziness and weakness   Immunizations Administered    Name Date Dose VIS Date Route   Pfizer COVID-19 Vaccine 09/15/2019 11:00 AM 0.3 mL 05/29/2019 Intramuscular   Manufacturer: [employer], [employer]   Lot: [id number]   NDC: [address]-9

## 2019-09-16 DIAGNOSIS — J449 Chronic obstructive pulmonary disease, unspecified: Secondary | ICD-10-CM | POA: Diagnosis not present

## 2019-09-20 ENCOUNTER — Other Ambulatory Visit: Payer: Self-pay | Admitting: Family Medicine

## 2019-09-20 DIAGNOSIS — I10 Essential (primary) hypertension: Secondary | ICD-10-CM

## 2019-09-29 NOTE — Progress Notes (Signed)
Nurse connected with patient 09/30/19 at 11:00 AM EDT by a telephone enabled telemedicine application and verified that I am speaking with the correct person using two identifiers. Patient stated full name and DOB. Patient gave permission to continue with virtual visit. Patient's location was at home and Nurse's location was at [place] office.   Subjective:   Tiffany Jennings is a 85 y.o. female who presents for an Initial Medicare Annual Wellness Visit.  The Patient was informed that the wellness visit is to identify future health risk and educate and initiate measures that can reduce risk for increased disease through the lifespan.   Describes health as fair, good or great? Good.   Review of Systems    Home Safety/Smoke Alarms: Feels safe in home. Smoke alarms in place.  Uses a walker for distance. Lives w/ daughter. 1 story home.    Female:        Mammo-    10/03/17. Pt wants to wait and discuss w/ PCP.    Dexa scan-  10/03/17. Pt wants to wait and discuss w/ PCP.             Objective:    Advanced Directives 09/30/2019 07/06/2019  Does Patient Have a Medical Advance Directive? Yes Yes  Type of Estate agent of [employer] Power of [place];Living will  Does patient want to make changes to medical advance directive? No - Patient declined -  Copy of Healthcare Power of Attorney in Chart? No - copy requested No - copy requested    Current Medications (verified) Outpatient Encounter Medications as of 09/30/2019  Medication Sig  . albuterol (VENTOLIN HFA) 108 (90 Base) MCG/ACT inhaler INHALE 2 PUFFS INTO THE LUNGS EVERY 6 HOURS AS NEEDED FOR WHEEZING OR SHORTNESS OF BREATH  . atorvastatin (LIPITOR) 10 MG tablet   . diltiazem (CARDIZEM CD) 120 MG 24 hr capsule Take 1 capsule (120 mg total) by mouth daily.  Marland Kitchen donepezil (ARICEPT) 10 MG tablet Take 1 tablet (10 mg total) by mouth at bedtime.  Marland Kitchen ELIQUIS 5 MG TABS tablet TAKE 1 TABLET(5 MG) BY MOUTH TWICE DAILY    . Fluticasone-Umeclidin-Vilant (TRELEGY ELLIPTA) 100-62.5-25 MCG/INH AEPB Inhale 1 puff into the lungs daily.  . furosemide (LASIX) 20 MG tablet TAKE 1 TABLET(20 MG) BY MOUTH DAILY  . imipramine (TOFRANIL) 50 MG tablet May take 1-2 nightly.  . levothyroxine (SYNTHROID) 75 MCG tablet Take 1 tablet (75 mcg total) by mouth daily.  . Tiotropium Bromide Monohydrate (SPIRIVA RESPIMAT) 2.5 MCG/ACT AERS Inhale 2 puffs into the lungs daily.   No facility-administered encounter medications on file as of 09/30/2019.    Allergies (verified) Codeine   History: Past Medical History:  Diagnosis Date  . A-fib (HCC)   . COPD (chronic obstructive pulmonary disease) (HCC)   . Hyperlipidemia   . Hypertension    Past Surgical History:  Procedure Laterality Date  . CAROTID ANGIOGRAM     History reviewed. No pertinent family history. Social History   Socioeconomic History  . Marital status: Single    Spouse name: Not on file  . Number of children: Not on file  . Years of education: Not on file  . Highest education level: Not on file  Occupational History  . Not on file  Tobacco Use  . Smoking status: Current Every Day Smoker    Packs/day: 0.25    Types: Cigarettes  . Smokeless tobacco: Never Used  . Tobacco comment: 2 cigs a day  Substance and Sexual Activity  .  Alcohol use: Not Currently  . Drug use: Never  . Sexual activity: Not on file  Other Topics Concern  . Not on file  Social History Narrative  . Not on file   Social Determinants of Health   Financial Resource Strain: Low Risk   . Difficulty of Paying Living Expenses: Not hard at all  Food Insecurity: No Food Insecurity  . Worried About Charity fundraiser in the Last Year: Never true  . Ran Out of Food in the Last Year: Never true  Transportation Needs: No Transportation Needs  . Lack of Transportation (Medical): No  . Lack of Transportation (Non-Medical): No  Physical Activity:   . Days of Exercise per Week:   .  Minutes of Exercise per Session:   Stress:   . Feeling of Stress :   Social Connections:   . Frequency of Communication with Friends and Family:   . Frequency of Social Gatherings with Friends and Family:   . Attends Religious Services:   . Active Member of Clubs or Organizations:   . Attends Archivist Meetings:   Marland Kitchen Marital Status:     Tobacco Counseling Ready to quit: No Counseling given: No Comment: 2 cigs a day   Clinical Intake: Pain : No/denies pain    Activities of Daily Living In your present state of health, do you have any difficulty performing the following activities: 09/30/2019  Hearing? N  Vision? N  Difficulty concentrating or making decisions? N  Walking or climbing stairs? N  Dressing or bathing? N  Doing errands, shopping? Y  Comment Dtr drives her.  Preparing Food and eating ? N  Using the Toilet? N  In the past six months, have you accidently leaked urine? N  Do you have problems with loss of bowel control? N  Managing your Medications? N  Managing your Finances? N  Housekeeping or managing your Housekeeping? Y  Comment daughter does.  Some recent data might be hidden     Immunizations and Health Maintenance Immunization History  Administered Date(s) Administered  . Fluad Quad(high Dose 65+) 03/06/2019  . Influenza Whole 02/18/2018  . PFIZER SARS-COV-2 Vaccination 08/16/2019, 09/15/2019   Health Maintenance Due  Topic Date Due  . URINE MICROALBUMIN  Never done  . PNA vac Low Risk Adult (1 of 2 - PCV13) Never done    Patient Care Team: Libby Maw, MD as PCP - General (Family Medicine)  Indicate any recent Medical Services you may have received from other than [facility] providers in the past year (date may be approximate).     Assessment:   This is a routine wellness examination for [place]. Physical assessment deferred to PCP.  Hearing/Vision screen Unable to assess. This visit is enabled though telemedicine due to  Covid 19.   Dietary issues and exercise activities discussed: Current Exercise Habits: Home exercise routine, Type of exercise: walking, Time (Minutes): 10, Frequency (Times/Week): 3, Weekly Exercise (Minutes/Week): 30, Exercise limited by: None identified Diet (meal preparation, eat out, water intake, caffeinated beverages, dairy products, fruits and vegetables): well balanced   Goals    . DIET - INCREASE WATER INTAKE      Depression Screen PHQ 2/9 Scores 09/30/2019 06/22/2019 06/22/2019 03/06/2019  PHQ - 2 Score 0 2 0 0  PHQ- 9 Score - 8 - 0    Fall Risk Fall Risk  09/30/2019 06/22/2019  Falls in the past year? 0 0  Number falls in past yr: 0 -  Injury with  Fall? 0 -  Follow up Education provided;Falls prevention discussed -    Cognitive Function: Ad8 score reviewed for issues:  Issues making decisions:no  Less interest in hobbies / activities:no  Repeats questions, stories (family complaining):no  Trouble using ordinary gadgets (microwave, computer, phone):no  Forgets the month or year: no  Mismanaging finances: no  Remembering appts:no  Daily problems with thinking and/or memory:no Ad8 score is=0         Screening Tests Health Maintenance  Topic Date Due  . URINE MICROALBUMIN  Never done  . PNA vac Low Risk Adult (1 of 2 - PCV13) Never done  . TETANUS/TDAP  03/05/2020 (Originally 06/16/1954)  . INFLUENZA VACCINE  01/17/2020  . DEXA SCAN  Completed     Plan:    Please schedule your next medicare wellness visit with me in 1 yr.  Continue to eat heart healthy diet (full of fruits, vegetables, whole grains, lean protein, water--limit salt, fat, and sugar intake) and increase physical activity as tolerated.  Continue doing brain stimulating activities (puzzles, reading, adult coloring books, staying active) to keep memory sharp.   Bring a copy of your living will and/or healthcare power of attorney to your next office visit.   I have personally reviewed and  noted the following in the patient's chart:   . Medical and social history . Use of alcohol, tobacco or illicit drugs  . Current medications and supplements . Functional ability and status . Nutritional status . Physical activity . Advanced directives . List of other physicians . Hospitalizations, surgeries, and ER visits in previous 12 months . Vitals . Screenings to include cognitive, depression, and falls . Referrals and appointments  In addition, I have reviewed and discussed with patient certain preventive protocols, quality metrics, and best practice recommendations. A written personalized care plan for preventive services as well as general preventive health recommendations were provided to patient.     Mady Haagensen [place], [place]   09/30/2019

## 2019-09-30 ENCOUNTER — Ambulatory Visit (INDEPENDENT_AMBULATORY_CARE_PROVIDER_SITE_OTHER): Payer: Medicare Other | Admitting: *Deleted

## 2019-09-30 ENCOUNTER — Encounter: Payer: Self-pay | Admitting: *Deleted

## 2019-09-30 DIAGNOSIS — Z0000 Encounter for general adult medical examination without abnormal findings: Secondary | ICD-10-CM

## 2019-09-30 NOTE — Patient Instructions (Signed)
Please schedule your next medicare wellness visit with me in 1 yr.  Continue to eat heart healthy diet (full of fruits, vegetables, whole grains, lean protein, water--limit salt, fat, and sugar intake) and increase physical activity as tolerated.  Continue doing brain stimulating activities (puzzles, reading, adult coloring books, staying active) to keep memory sharp.   Bring a copy of your living will and/or healthcare power of attorney to your next office visit.   Tiffany Jennings , Thank you for taking time to come for your Medicare Wellness Visit. I appreciate your ongoing commitment to your health goals. Please review the following plan we discussed and let me know if I can assist you in the future.   These are the goals we discussed: Goals    . DIET - INCREASE WATER INTAKE       This is a list of the screening recommended for you and due dates:  Health Maintenance  Topic Date Due  . Urine Protein Check  Never done  . Pneumonia vaccines (1 of 2 - PCV13) Never done  . Tetanus Vaccine  03/05/2020*  . Flu Shot  01/17/2020  . DEXA scan (bone density measurement)  Completed  *Topic was postponed. The date shown is not the original due date.    Preventive Care 40 Years and Older, Female Preventive care refers to lifestyle choices and visits with your health care provider that can promote health and wellness. This includes:  A yearly physical exam. This is also called an annual well check.  Regular dental and eye exams.  Immunizations.  Screening for certain conditions.  Healthy lifestyle choices, such as diet and exercise. What can I expect for my preventive care visit? Physical exam Your health care provider will check:  Height and weight. These may be used to calculate body mass index (BMI), which is a measurement that tells if you are at a healthy weight.  Heart rate and blood pressure.  Your skin for abnormal spots. Counseling Your health care provider may ask you  questions about:  Alcohol, tobacco, and drug use.  Emotional well-being.  Home and relationship well-being.  Sexual activity.  Eating habits.  History of falls.  Memory and ability to understand (cognition).  Work and work Statistician.  Pregnancy and menstrual history. What immunizations do I need?  Influenza (flu) vaccine  This is recommended every year. Tetanus, diphtheria, and pertussis (Tdap) vaccine  You may need a Td booster every 10 years. Varicella (chickenpox) vaccine  You may need this vaccine if you have not already been vaccinated. Zoster (shingles) vaccine  You may need this after age 61. Pneumococcal conjugate (PCV13) vaccine  One dose is recommended after age 37. Pneumococcal polysaccharide (PPSV23) vaccine  One dose is recommended after age 43. Measles, mumps, and rubella (MMR) vaccine  You may need at least one dose of MMR if you were born in 1957 or later. You may also need a second dose. Meningococcal conjugate (MenACWY) vaccine  You may need this if you have certain conditions. Hepatitis A vaccine  You may need this if you have certain conditions or if you travel or work in places where you may be exposed to hepatitis A. Hepatitis B vaccine  You may need this if you have certain conditions or if you travel or work in places where you may be exposed to hepatitis B. Haemophilus influenzae type b (Hib) vaccine  You may need this if you have certain conditions. You may receive vaccines as individual doses or  as more than one vaccine together in one shot (combination vaccines). Talk with your health care provider about the risks and benefits of combination vaccines. What tests do I need? Blood tests  Lipid and cholesterol levels. These may be checked every 5 years, or more frequently depending on your overall health.  Hepatitis C test.  Hepatitis B test. Screening  Lung cancer screening. You may have this screening every year starting at  age 11 if you have a 30-pack-year history of smoking and currently smoke or have quit within the past 15 years.  Colorectal cancer screening. All adults should have this screening starting at age 85 and continuing until age 10. Your health care provider may recommend screening at age 18 if you are at increased risk. You will have tests every 1-10 years, depending on your results and the type of screening test.  Diabetes screening. This is done by checking your blood sugar (glucose) after you have not eaten for a while (fasting). You may have this done every 1-3 years.  Mammogram. This may be done every 1-2 years. Talk with your health care provider about how often you should have regular mammograms.  BRCA-related cancer screening. This may be done if you have a family history of breast, ovarian, tubal, or peritoneal cancers. Other tests  Sexually transmitted disease (STD) testing.  Bone density scan. This is done to screen for osteoporosis. You may have this done starting at age 28. Follow these instructions at home: Eating and drinking  Eat a diet that includes fresh fruits and vegetables, whole grains, lean protein, and low-fat dairy products. Limit your intake of foods with high amounts of sugar, saturated fats, and salt.  Take vitamin and mineral supplements as recommended by your health care provider.  Do not drink alcohol if your health care provider tells you not to drink.  If you drink alcohol: ? Limit how much you have to 0-1 drink a day. ? Be aware of how much alcohol is in your drink. In the [place] one drink equals one 12 oz bottle of beer (355 mL), one 5 oz glass of wine (148 mL), or one 1 oz glass of hard liquor (44 mL). Lifestyle  Take daily care of your teeth and gums.  Stay active. Exercise for at least 30 minutes on 5 or more days each week.  Do not use any products that contain nicotine or tobacco, such as cigarettes, e-cigarettes, and chewing tobacco. If you need  help quitting, ask your health care provider.  If you are sexually active, practice safe sex. Use a condom or other form of protection in order to prevent STIs (sexually transmitted infections).  Talk with your health care provider about taking a low-dose aspirin or statin. What's next?  Go to your health care provider once a year for a well check visit.  Ask your health care provider how often you should have your eyes and teeth checked.  Stay up to date on all vaccines. This information is not intended to replace advice given to you by your health care provider. Make sure you discuss any questions you have with your health care provider. Document Revised: 05/29/2018 Document Reviewed: 05/29/2018 Elsevier Patient Education  2020 [employer].

## 2019-10-05 ENCOUNTER — Other Ambulatory Visit: Payer: Self-pay

## 2019-10-05 ENCOUNTER — Encounter: Payer: Self-pay | Admitting: Pulmonary Disease

## 2019-10-05 ENCOUNTER — Ambulatory Visit: Payer: Medicare Other | Admitting: Pulmonary Disease

## 2019-10-05 VITALS — BP 104/58 | HR 80 | Temp 97.2°F | Ht 66.75 in | Wt 192.2 lb

## 2019-10-05 DIAGNOSIS — J9611 Chronic respiratory failure with hypoxia: Secondary | ICD-10-CM

## 2019-10-05 DIAGNOSIS — J441 Chronic obstructive pulmonary disease with (acute) exacerbation: Secondary | ICD-10-CM

## 2019-10-05 NOTE — Progress Notes (Signed)
Tiffany Jennings    [id number]    Sep 12, 1934  Primary Care Physician:Kremer, Talmadge Coventry, MD  Referring Physician: Mliss Sax, MD [address] [place],  [place] [address]  Chief complaint:   Patient with a history of obstructive lung disease, chronic respiratory failure In for follow-up today HPI:  Diagnosed with COPD many years ago Relocated from [place]  Uses Spiriva and albuterol States that sometimes she feels she is not getting a good breath  Increased cough with possibly thickened secretions, still able to clear secretions well Not feeling acutely ill  She did try Trelegy-not affordable for her  Smokes about 3 to 4 cigarettes a day, over a pack a day in the past History of chronic respiratory failure-has been on oxygen at 2 L She checks her oxygen on a regular basis, off oxygen she runs as low as 83, mostly keeps it in the low 90s with oxygen at 2 L She is able to walk with a walker about 50 yards or bit more  History of atrial fibrillation  History of chronic respiratory failure, was started on BiPAP-has been on BiPAP for 2 to 3 years    Outpatient Encounter Medications as of 10/05/2019  Medication Sig  . albuterol (VENTOLIN HFA) 108 (90 Base) MCG/ACT inhaler INHALE 2 PUFFS INTO THE LUNGS EVERY 6 HOURS AS NEEDED FOR WHEEZING OR SHORTNESS OF BREATH  . atorvastatin (LIPITOR) 10 MG tablet   . diltiazem (CARDIZEM CD) 120 MG 24 hr capsule Take 1 capsule (120 mg total) by mouth daily.  Marland Kitchen donepezil (ARICEPT) 10 MG tablet Take 1 tablet (10 mg total) by mouth at bedtime.  Marland Kitchen ELIQUIS 5 MG TABS tablet TAKE 1 TABLET(5 MG) BY MOUTH TWICE DAILY  . Fluticasone-Umeclidin-Vilant (TRELEGY ELLIPTA) 100-62.5-25 MCG/INH AEPB Inhale 1 puff into the lungs daily.  . furosemide (LASIX) 20 MG tablet TAKE 1 TABLET(20 MG) BY MOUTH DAILY  . imipramine (TOFRANIL) 50 MG tablet May take 1-2 nightly.  . levothyroxine (SYNTHROID) 75 MCG tablet Take 1 tablet (75  mcg total) by mouth daily.  . Tiotropium Bromide Monohydrate (SPIRIVA RESPIMAT) 2.5 MCG/ACT AERS Inhale 2 puffs into the lungs daily.   No facility-administered encounter medications on file as of 10/05/2019.    Allergies as of 10/05/2019 - Review Complete 10/05/2019  Allergen Reaction Noted  . Codeine  02/19/2018    Past Medical History:  Diagnosis Date  . A-fib (HCC)   . COPD (chronic obstructive pulmonary disease) (HCC)   . Hyperlipidemia   . Hypertension     Past Surgical History:  Procedure Laterality Date  . CAROTID ANGIOGRAM      History reviewed. No pertinent family history.  Social History   Socioeconomic History  . Marital status: Single    Spouse name: Not on file  . Number of children: Not on file  . Years of education: Not on file  . Highest education level: Not on file  Occupational History  . Not on file  Tobacco Use  . Smoking status: Current Every Day Smoker    Packs/day: 0.25    Years: 50.00    Pack years: 12.50    Types: Cigarettes  . Smokeless tobacco: Never Used  . Tobacco comment: 2 cigs a day  Substance and Sexual Activity  . Alcohol use: Not Currently  . Drug use: Never  . Sexual activity: Not on file  Other Topics Concern  . Not on file  Social History Narrative  .  Not on file   Social Determinants of Health   Financial Resource Strain: Low Risk   . Difficulty of Paying Living Expenses: Not hard at all  Food Insecurity: No Food Insecurity  . Worried About Charity fundraiser in the Last Year: Never true  . Ran Out of Food in the Last Year: Never true  Transportation Needs: No Transportation Needs  . Lack of Transportation (Medical): No  . Lack of Transportation (Non-Medical): No  Physical Activity:   . Days of Exercise per Week:   . Minutes of Exercise per Session:   Stress:   . Feeling of Stress :   Social Connections:   . Frequency of Communication with Friends and Family:   . Frequency of Social Gatherings with Friends  and Family:   . Attends Religious Services:   . Active Member of Clubs or Organizations:   . Attends Archivist Meetings:   Marland Kitchen Marital Status:   Intimate Partner Violence:   . Fear of Current or Ex-Partner:   . Emotionally Abused:   Marland Kitchen Physically Abused:   . Sexually Abused:     Review of Systems  Constitutional: Negative.   HENT: Negative.   Eyes: Negative.   Respiratory: Positive for shortness of breath and wheezing.   Cardiovascular: Negative.   Gastrointestinal: Negative.   Endocrine: Negative.   All other systems reviewed and are negative.   Vitals:   10/05/19 1057  BP: (!) 104/58  Pulse: 80  Temp: (!) 97.2 F (36.2 C)  SpO2: 95%     Physical Exam  Constitutional: She appears well-developed and well-nourished.  HENT:  Head: Normocephalic and atraumatic.  Eyes: Pupils are equal, round, and reactive to light. Right eye exhibits no discharge. Left eye exhibits no discharge.  Neck: No tracheal deviation present. No thyromegaly present.  Cardiovascular: Normal rate and regular rhythm.  Pulmonary/Chest: Effort normal. No respiratory distress. She has no wheezes. She has no rales.  Abdominal: There is no abdominal tenderness.  Musculoskeletal:     Cervical back: Normal range of motion and neck supple.     Data Reviewed: Records from previous primary physician reviewed Had a CT scan done in 2019 showing some granulomatous disease-stable from previous No PFT  Assessment:  Chronic respiratory failure -Secondary to obstructive lung disease -Continue oxygen supplementation at 2 L -Continue BiPAP at night -Download from her machine-compliant, has an AHI of 9  Chronic obstructive pulmonary disease -Controlled symptoms on Spiriva and Proventil -We did try to switch to Trelegy-not affordable  Active smoker -Working on quitting -Currently smokes about 3 to 4 cigarettes a day  Deconditioning -We will start/increase activity as tolerated -Graded activity  as tolerated  Atrial fibrillation -Controlled    Plan/Recommendations:  Continue BiPAP Continue Spiriva and albuterol Graded exercise as tolerated Counseling regarding smoking cessation I will see her back in the office in about 3 months Encouraged to continue using BiPAP on a regular basis N-acetylcysteine 600 mg twice daily may be added Mucinex 600 twice daily as needed  Sherrilyn Rist MD [facility] 10/05/2019, 11:06 AM  CC: Libby Maw,*

## 2019-10-05 NOTE — Patient Instructions (Signed)
N acetylcysteine(NAC) 600 mg twice daily  Mucinex/guaifenesin-cough medicine may be used whenever you have increased secretions with difficulty clearing it  Continue with your current inhalers  Regular exercises as you can tolerate  Call with significant concerns  I will see you in 3 months

## 2019-10-14 ENCOUNTER — Telehealth: Payer: Self-pay | Admitting: Family Medicine

## 2019-10-14 NOTE — Progress Notes (Signed)
  Chronic Care Management   Note  10/14/2019 Name: Nevelyn Mellott MRN: [id number] DOB: Aug 07, 1934  Zenaida Tesar is a 85 y.o. year old female who is a primary care patient of Mliss Sax, MD. I reached out to [facility] by phone today in response to a referral sent by Ms. Mitzi Davenport Kloosterman's PCP, Doreene Burke Talmadge Coventry, MD.   Ms. Watkin was given information about Chronic Care Management services today including:  1. CCM service includes personalized support from designated clinical staff supervised by her physician, including individualized plan of care and coordination with other care providers 2. 24/7 contact phone numbers for assistance for urgent and routine care needs. 3. Service will only be billed when office clinical staff spend 20 minutes or more in a month to coordinate care. 4. Only one practitioner may furnish and bill the service in a calendar month. 5. The patient may stop CCM services at any time (effective at the end of the month) by phone call to the office staff.   Patient agreed to services and verbal consent obtained.   This note is not being shared with the patient for the following reason: To respect privacy (The patient or proxy has requested that the information not be shared). Follow up plan:   Lynnae January Upstream Scheduler

## 2019-10-16 DIAGNOSIS — J449 Chronic obstructive pulmonary disease, unspecified: Secondary | ICD-10-CM | POA: Diagnosis not present

## 2019-10-30 NOTE — Addendum Note (Signed)
Addended by: Lake Bells on: 10/30/2019 01:59 PM   Modules accepted: Orders

## 2019-11-03 ENCOUNTER — Ambulatory Visit: Payer: Medicare Other

## 2019-11-03 DIAGNOSIS — I4891 Unspecified atrial fibrillation: Secondary | ICD-10-CM

## 2019-11-03 DIAGNOSIS — E7800 Pure hypercholesterolemia, unspecified: Secondary | ICD-10-CM

## 2019-11-03 NOTE — Chronic Care Management (AMB) (Signed)
Chronic Care Management Pharmacy  Name: Apolonia Ellwood  MRN: [id number] DOB: 1935/05/25  Chief Complaint/ HPI  [name],  85 y.o. , female presents for their Initial CCM visit with the clinical pharmacist via telephone due to COVID-19 Pandemic.  PCP : Mliss Sax, MD  Their chronic conditions include: A-Fib, Hypertension, hypothyroidism, depression, hyperlipidemia, COPD, tobacco abuse, chronic kidney disease   Office Visits: 09/30/19: Patient presented to Mady Haagensen, RN for AWV.  06/22/19: Patient presented to Dr. Doreene Burke for follow-up. A1c slightly elevated, kidney function stable. No medication changes made.   Consult Visit: 10/05/19: Patient presented to Dr. Wynona Neat (pulmonology) for COPD follow-up. Trelegy unaffordable, back on Spiriva. Patient with ongoing shortness of breath/wheezing, stable. Patient counselled to start NAC BID and Mucinex PRN for secretions.   06/29/19: Patient presented to Dr. Bjorn Pippin (cardiology) for A-Fib follow-up. Patient with no recurrence of A-Fib symptoms. Zio monitor placed, which showed extra beats, but sinus rhythm. No medication changes made.   Medications: Outpatient Encounter Medications as of 11/03/2019  Medication Sig  . albuterol (VENTOLIN HFA) 108 (90 Base) MCG/ACT inhaler INHALE 2 PUFFS INTO THE LUNGS EVERY 6 HOURS AS NEEDED FOR WHEEZING OR SHORTNESS OF BREATH  . atorvastatin (LIPITOR) 10 MG tablet   . carboxymethylcellulose (REFRESH PLUS) 0.5 % SOLN 1 drop 3 (three) times daily as needed.  . diltiazem (CARDIZEM CD) 120 MG 24 hr capsule Take 1 capsule (120 mg total) by mouth daily.  Marland Kitchen donepezil (ARICEPT) 10 MG tablet Take 1 tablet (10 mg total) by mouth at bedtime.  Marland Kitchen ELIQUIS 5 MG TABS tablet TAKE 1 TABLET(5 MG) BY MOUTH TWICE DAILY  . furosemide (LASIX) 20 MG tablet TAKE 1 TABLET(20 MG) BY MOUTH DAILY  . imipramine (TOFRANIL) 50 MG tablet May take 1-2 nightly.  . levothyroxine (SYNTHROID) 75 MCG tablet Take 1 tablet (75 mcg  total) by mouth daily.  . Fluticasone-Umeclidin-Vilant (TRELEGY ELLIPTA) 100-62.5-25 MCG/INH AEPB Inhale 1 puff into the lungs daily. (Patient not taking: Reported on 11/03/2019)  . Tiotropium Bromide Monohydrate (SPIRIVA RESPIMAT) 2.5 MCG/ACT AERS Inhale 2 puffs into the lungs daily.   No facility-administered encounter medications on file as of 11/03/2019.   SDOH Interventions     Most Recent Value  SDOH Interventions  SDOH Interventions for the Following Domains  Financial Strain  Financial Strain Interventions  Other (Comment) [Referred to [employer] counselor]     Current Diagnosis/Assessment:  Goals Addressed            This Visit's Progress   . Chronic Care Management       CARE PLAN ENTRY  Current Barriers:  . Chronic Disease Management support, education, and care coordination needs related to Hyperlipidemia, COPD, Chronic Kidney Disease, Hypothyroidism, and Pre-Diabetes   Hyperlipidemia . Pharmacist Clinical Goal(s): o Over the next 90 days, patient will work with PharmD and providers to maintain LDL goal < 100 . Current regimen:  o Atorvastatin 10 mg   Pre-Diabetes . Pharmacist Clinical Goal(s): o Over the next 90 days, patient will work with PharmD and providers to prevent progression to Diabetes . Interventions: o Recommend following a low-carbohydrate diet and incorporating more non-starchy vegetables.  . Patient self care activities - Over the next 90 days, patient will: o Limit Starch intake  o Work to increase activity to 150 minutes of moderate-intensity exercise weekly  Atrial Fibrillation . Pharmacist Clinical Goal(s) o Over the next 90 days, patient will work with PharmD and providers to prevent symptoms of heart flutter . Current  regimen:  o Diltiazem 120 mg  o Eliquis 5 mg   Medication management . Pharmacist Clinical Goal(s): o Over the next 90 days, patient will work with PharmD and providers to maintain optimal medication adherence . Current  pharmacy: [facility] . Interventions o Comprehensive medication review performed. o Continue current medication management strategy. If you have any questions about me helping dispense your medications, let me know.  . Patient self care activities - Over the next 90 days, patient will: o Take medications as prescribed o Report any questions or concerns to PharmD and/or provider(s) o Reach out to the [facility] counselors to see if you qualify for the Extra Help Program to help with medication costs. You can reach them at [phone]       AFIB   Patient is currently rate controlled.  Patient has failed these meds in past: Amiodarone (pulmonary toxicity risk) Patient is currently controlled on the following medications:   Eliquis 5 mg BID   Diltiazem CD 120 mg daily  We discussed: Denies unusual bruising or bleeding. States it is hard to afford her Eliquis at times   Plan  Continue current medications  Referred to [facility] Extra Help Program  ,  COPD / Asthma / Tobacco   Gold Grade: n/a Current COPD Classification:  B (high sx, <2 exacerbations/yr)  Eosinophil count:  No results found for: EOSPCT%                               Eos (Absolute): No results found for: EOSABS  Tobacco Status:  Social History   Tobacco Use  Smoking Status Current Every Day Smoker  . Packs/day: 0.25  . Years: 50.00  . Pack years: 12.50  . Types: Cigarettes  Smokeless Tobacco Never Used  Tobacco Comment   2 cigs a day    Patient has failed these meds in past: Trelegy (unnafordable) Patient is currently controlled on the following medications:   Albuterol HFA 108 mcg/act   Spiriva 2.5 mcg daily ($147/month)    Using maintenance inhaler regularly? Yes Frequency of rescue inhaler use:  1-2x per week  We discussed:  proper inhaler technique. Patient never started NAC or guaifenesin, as she didn't think symptoms were severe enough to need them. States significant challenges affording Spiriva now  that she is in the [facility].   Plan  Continue current medications  Referred to [facility] Extra Help Program  Tobacco Abuse   Tobacco Status:  Social History   Tobacco Use  Smoking Status Current Every Day Smoker  . Packs/day: 0.25  . Years: 50.00  . Pack years: 12.50  . Types: Cigarettes  Smokeless Tobacco Never Used  Tobacco Comment   2 cigs a day    Patient smokes After 30 minutes of waking Patient triggers include: boredom  and finishing a meal On a scale of 1-10, reports MOTIVATION to quit is 5 On a scale of 1-10, reports CONFIDENCE in quitting is 2  Patient has failed these meds in past: none Patient is currently uncontrolled on the following medications: none  We discussed:  Patient interested in quitting smoking, but unwilling to set a quit date at this time. Provided counseling on nicotine replacement options and offered to help support patient in her quit attempt if willing.   Plan  Continue current medications   Diabetes   Recent Relevant Labs: Lab Results  Component Value Date/Time   HGBA1C 6.3 06/22/2019 11:46 AM  Checking BG: Never  Recent FBG Readings: n/a Recent pre-meal BG readings: n/a Recent 2hr PP BG readings:  n/a Recent HS BG readings: n/a Patient has failed these meds in past: n/a Patient is currently controlled on the following medications: none  Last diabetic Foot exam: No results found for: HMDIABEYEEXA  Last diabetic Eye exam: No results found for: HMDIABFOOTEX   We discussed: diet and exercise extensively. Eating high carbohydrate diet.  Morning: Roll with hot chocolate  Evening: Meatloaf with corn and mashed potatoes   Wants to get into the gym, but has not been active recently. Does not have a specific start date in mind.   Plan  Recommend Low-Carb, DASH DIET.   Hypertension   BP today is:  <140/90  Office blood pressures are  BP Readings from Last 3 Encounters:  10/05/19 (!) 104/58  06/29/19 (!) 98/58  06/22/19  132/64   CMP Latest Ref Rng & Units 06/22/2019 03/06/2019  Glucose 70 - 99 mg/dL 638(L) 83  BUN 6 - 23 mg/dL 21 24  Creatinine 3.73 - 1.20 mg/dL 4.28(J) 6.81(L)  Sodium 135 - 145 mEq/L 142 141  Potassium 3.5 - 5.1 mEq/L 4.6 5.0  Chloride 96 - 112 mEq/L 101 101  CO2 19 - 32 mEq/L 31 28  Calcium 8.4 - 10.5 mg/dL 9.3 9.3  Total Protein 6.1 - 8.1 g/dL - 6.3  Total Bilirubin 0.2 - 1.2 mg/dL - 0.3  AST 10 - 35 U/L - 15  ALT 6 - 29 U/L - 13   Patient has failed these meds in the past: n/a Patient is currently controlled on the following medications:   Furosemide 20 mg daily  Patient checks BP at home infrequently  Patient home BP readings are ranging: n/a  We discussed diet and exercise extensively. Denies significant edema.   Plan  Continue current medications   Hyperlipidemia   Lipid Panel     Component Value Date/Time   LDLDIRECT 86 03/06/2019 1142     The ASCVD Risk score (Goff DC Jr., et al., 2013) failed to calculate for the following reasons:   The 2013 ASCVD risk score is only valid for ages 48 to 10   Patient has failed these meds in past: n/a Patient is currently controlled on the following medications:   Atorvastatin 10 mg daily  We discussed:  diet and exercise extensively  Plan  Continue current medications  Hypothyroidism   TSH  Date Value Ref Range Status  03/06/2019 0.59 0.40 - 4.50 mIU/L Final     Patient has failed these meds in past: n/a Patient is currently controlled on the following medications:   Levothyroxine 75 mcg daily  We discussed:  Separates levothyroxine by 30 minutes from breakfast/ other medications.   Plan  Continue current medications  Misc/OTC   Donepezil 10 mg QHS  Imipramine 50-100 mg nightly (has been taking 2 nightly) - Mood improved. Denies dry mouth, dizziness.   Eye drops   Plan  Continue current medications  Vaccines   Reviewed and discussed patient's vaccination history.    Immunization History    Administered Date(s) Administered  . Fluad Quad(high Dose 65+) 03/06/2019  . Influenza Whole 02/18/2018  . PFIZER SARS-COV-2 Vaccination 08/16/2019, 09/15/2019    Plan  Recommended patient receive Shingrix vaccine.  Medication Management   Pt uses [facility] for all medications. Patient's daughter picks up medicines and fills pill box for her. Uses pill box? Yes Pt endorses 100% compliance  Plan  Continue current medication  management strategy  Follow up: 3 month phone visit  Garey Ham Clinical Pharmacist Corinda Gubler at [facility]  [phone]

## 2019-11-03 NOTE — Patient Instructions (Addendum)
Visit Information It was great speaking with you today!  Please let me know if you have any questions about our visit.  Goals Addressed            This Visit's Progress   . Chronic Care Management       CARE PLAN ENTRY  Current Barriers:  . Chronic Disease Management support, education, and care coordination needs related to Hyperlipidemia, COPD, Chronic Kidney Disease, Hypothyroidism, and Pre-Diabetes   Hyperlipidemia . Pharmacist Clinical Goal(s): o Over the next 90 days, patient will work with PharmD and providers to maintain LDL goal < 100 . Current regimen:  o Atorvastatin 10 mg   Pre-Diabetes . Pharmacist Clinical Goal(s): o Over the next 90 days, patient will work with PharmD and providers to prevent progression to Diabetes . Interventions: o Recommend following a low-carbohydrate diet and incorporating more non-starchy vegetables.  . Patient self care activities - Over the next 90 days, patient will: o Limit Starch intake  o Work to increase activity to 150 minutes of moderate-intensity exercise weekly  Atrial Fibrillation . Pharmacist Clinical Goal(s) o Over the next 90 days, patient will work with PharmD and providers to prevent symptoms of heart flutter . Current regimen:  o Diltiazem 120 mg  o Eliquis 5 mg   Medication management . Pharmacist Clinical Goal(s): o Over the next 90 days, patient will work with PharmD and providers to maintain optimal medication adherence . Current pharmacy: [facility] . Interventions o Comprehensive medication review performed. o Continue current medication management strategy. If you have any questions about me helping dispense your medications, let me know.  . Patient self care activities - Over the next 90 days, patient will: o Take medications as prescribed o Report any questions or concerns to PharmD and/or provider(s) o Reach out to the [facility] counselors to see if you qualify for the Extra Help Program to help with  medication costs. You can reach them at [phone]       Ms. Rabago was given information about Chronic Care Management services today including:  1. CCM service includes personalized support from designated clinical staff supervised by her physician, including individualized plan of care and coordination with other care providers 2. 24/7 contact phone numbers for assistance for urgent and routine care needs. 3. Standard insurance, coinsurance, copays and deductibles apply for chronic care management only during months in which we provide at least 20 minutes of these services. Most insurances cover these services at 100%, however patients may be responsible for any copay, coinsurance and/or deductible if applicable. This service may help you avoid the need for more expensive face-to-face services. 4. Only one practitioner may furnish and bill the service in a calendar month. 5. The patient may stop CCM services at any time (effective at the end of the month) by phone call to the office staff.  Patient agreed to services and verbal consent obtained.   The patient verbalized understanding of instructions provided today and agreed to receive a mailed copy of patient instruction and/or educational materials. Telephone follow up appointment with pharmacy team member scheduled for: 02/03/20 at 1:00 PM  Garey Ham Clinical Pharmacist Corinda Gubler at [facility]  [phone]  DASH Eating Plan DASH stands for "Dietary Approaches to Stop Hypertension." The DASH eating plan is a healthy eating plan that has been shown to reduce high blood pressure (hypertension). It may also reduce your risk for type 2 diabetes, heart disease, and stroke. The DASH eating plan may also help with weight  loss. What are tips for following this plan?  General guidelines  Avoid eating more than 2,300 mg (milligrams) of salt (sodium) a day. If you have hypertension, you may need to reduce your sodium intake to  1,500 mg a day.  Limit alcohol intake to no more than 1 drink a day for nonpregnant women and 2 drinks a day for men. One drink equals 12 oz of beer, 5 oz of wine, or 1 oz of hard liquor.  Work with your health care provider to maintain a healthy body weight or to lose weight. Ask what an ideal weight is for you.  Get at least 30 minutes of exercise that causes your heart to beat faster (aerobic exercise) most days of the week. Activities may include walking, swimming, or biking.  Work with your health care provider or diet and nutrition specialist (dietitian) to adjust your eating plan to your individual calorie needs. Reading food labels   Check food labels for the amount of sodium per serving. Choose foods with less than 5 percent of the Daily Value of sodium. Generally, foods with less than 300 mg of sodium per serving fit into this eating plan.  To find whole grains, look for the word "whole" as the first word in the ingredient list. Shopping  Buy products labeled as "low-sodium" or "no salt added."  Buy fresh foods. Avoid canned foods and premade or frozen meals. Cooking  Avoid adding salt when cooking. Use salt-free seasonings or herbs instead of table salt or sea salt. Check with your health care provider or pharmacist before using salt substitutes.  Do not fry foods. Cook foods using healthy methods such as baking, boiling, grilling, and broiling instead.  Cook with heart-healthy oils, such as olive, canola, soybean, or sunflower oil. Meal planning  Eat a balanced diet that includes: ? 5 or more servings of fruits and vegetables each day. At each meal, try to fill half of your plate with fruits and vegetables. ? Up to 6-8 servings of whole grains each day. ? Less than 6 oz of lean meat, poultry, or fish each day. A 3-oz serving of meat is about the same size as a deck of cards. One egg equals 1 oz. ? 2 servings of low-fat dairy each day. ? A serving of nuts, seeds, or  beans 5 times each week. ? Heart-healthy fats. Healthy fats called Omega-3 fatty acids are found in foods such as flaxseeds and coldwater fish, like sardines, salmon, and mackerel.  Limit how much you eat of the following: ? Canned or prepackaged foods. ? Food that is high in trans fat, such as fried foods. ? Food that is high in saturated fat, such as fatty meat. ? Sweets, desserts, sugary drinks, and other foods with added sugar. ? Full-fat dairy products.  Do not salt foods before eating.  Try to eat at least 2 vegetarian meals each week.  Eat more home-cooked food and less restaurant, buffet, and fast food.  When eating at a restaurant, ask that your food be prepared with less salt or no salt, if possible. What foods are recommended? The items listed may not be a complete list. Talk with your dietitian about what dietary choices are best for you. Grains Whole-grain or whole-wheat bread. Whole-grain or whole-wheat pasta. Brown rice. Orpah Cobb. Bulgur. Whole-grain and low-sodium cereals. Pita bread. Low-fat, low-sodium crackers. Whole-wheat flour tortillas. Vegetables Fresh or frozen vegetables (raw, steamed, roasted, or grilled). Low-sodium or reduced-sodium tomato and vegetable juice. Low-sodium or  reduced-sodium tomato sauce and tomato paste. Low-sodium or reduced-sodium canned vegetables. Fruits All fresh, dried, or frozen fruit. Canned fruit in natural juice (without added sugar). Meat and other protein foods Skinless chicken or [place]. Ground chicken or [place]. Pork with fat trimmed off. Fish and seafood. Egg whites. Dried beans, peas, or lentils. Unsalted nuts, nut butters, and seeds. Unsalted canned beans. Lean cuts of beef with fat trimmed off. Low-sodium, lean deli meat. Dairy Low-fat (1%) or fat-free (skim) milk. Fat-free, low-fat, or reduced-fat cheeses. Nonfat, low-sodium ricotta or cottage cheese. Low-fat or nonfat yogurt. Low-fat, low-sodium cheese. Fats and  oils Soft margarine without trans fats. Vegetable oil. Low-fat, reduced-fat, or light mayonnaise and salad dressings (reduced-sodium). Canola, safflower, olive, soybean, and sunflower oils. Avocado. Seasoning and other foods Herbs. Spices. Seasoning mixes without salt. Unsalted popcorn and pretzels. Fat-free sweets. What foods are not recommended? The items listed may not be a complete list. Talk with your dietitian about what dietary choices are best for you. Grains Baked goods made with fat, such as croissants, muffins, or some breads. Dry pasta or rice meal packs. Vegetables Creamed or fried vegetables. Vegetables in a cheese sauce. Regular canned vegetables (not low-sodium or reduced-sodium). Regular canned tomato sauce and paste (not low-sodium or reduced-sodium). Regular tomato and vegetable juice (not low-sodium or reduced-sodium). Angie Fava. Olives. Fruits Canned fruit in a light or heavy syrup. Fried fruit. Fruit in cream or butter sauce. Meat and other protein foods Fatty cuts of meat. Ribs. Fried meat. Berniece Salines. Sausage. Bologna and other processed lunch meats. Salami. Fatback. Hotdogs. Bratwurst. Salted nuts and seeds. Canned beans with added salt. Canned or smoked fish. Whole eggs or egg yolks. Chicken or [place] with skin. Dairy Whole or 2% milk, cream, and half-and-half. Whole or full-fat cream cheese. Whole-fat or sweetened yogurt. Full-fat cheese. Nondairy creamers. Whipped toppings. Processed cheese and cheese spreads. Fats and oils Butter. Stick margarine. Lard. Shortening. Ghee. Bacon fat. Tropical oils, such as coconut, palm kernel, or palm oil. Seasoning and other foods Salted popcorn and pretzels. Onion salt, garlic salt, seasoned salt, table salt, and sea salt. Worcestershire sauce. Tartar sauce. Barbecue sauce. Teriyaki sauce. Soy sauce, including reduced-sodium. Steak sauce. Canned and packaged gravies. Fish sauce. Oyster sauce. Cocktail sauce. Horseradish that you find on the  shelf. Ketchup. Mustard. Meat flavorings and tenderizers. Bouillon cubes. Hot sauce and Tabasco sauce. Premade or packaged marinades. Premade or packaged taco seasonings. Relishes. Regular salad dressings. Where to find more information:  National Heart, Lung, and [place]: https://wilson-eaton.com/  American Heart Association: www.heart.org Summary  The DASH eating plan is a healthy eating plan that has been shown to reduce high blood pressure (hypertension). It may also reduce your risk for type 2 diabetes, heart disease, and stroke.  With the DASH eating plan, you should limit salt (sodium) intake to 2,300 mg a day. If you have hypertension, you may need to reduce your sodium intake to 1,500 mg a day.  When on the DASH eating plan, aim to eat more fresh fruits and vegetables, whole grains, lean proteins, low-fat dairy, and heart-healthy fats.  Work with your health care provider or diet and nutrition specialist (dietitian) to adjust your eating plan to your individual calorie needs. This information is not intended to replace advice given to you by your health care provider. Make sure you discuss any questions you have with your health care provider. Document Revised: 05/17/2017 Document Reviewed: 05/28/2016 Elsevier Patient Education  2020 [employer].

## 2019-11-05 ENCOUNTER — Other Ambulatory Visit: Payer: Self-pay | Admitting: Pulmonary Disease

## 2019-11-12 ENCOUNTER — Other Ambulatory Visit: Payer: Self-pay | Admitting: Family Medicine

## 2019-11-12 DIAGNOSIS — E039 Hypothyroidism, unspecified: Secondary | ICD-10-CM

## 2019-11-16 DIAGNOSIS — J449 Chronic obstructive pulmonary disease, unspecified: Secondary | ICD-10-CM | POA: Diagnosis not present

## 2019-11-23 ENCOUNTER — Ambulatory Visit: Payer: Self-pay

## 2019-11-23 DIAGNOSIS — E7800 Pure hypercholesterolemia, unspecified: Secondary | ICD-10-CM

## 2019-11-23 DIAGNOSIS — I4891 Unspecified atrial fibrillation: Secondary | ICD-10-CM

## 2019-11-23 NOTE — Chronic Care Management (AMB) (Signed)
General assessment was performed on Tiffany Jennings, [birth year] on 11/23/2019 by Trinna Post. Since last visit with CPP, the following interventions have been made: None since last CPP visit.  The patient has not had an ED visit since their last CPP follow up. The patient's current Chronic and PDC medications are: Atorvastatin 10 mg, Eliquis 5 mg, Spiriva. They currently do not have a greater than 5 day gap between last fill. The patient has not had problems with their health. The patient has had the following problems with their pharmacy: Continues to have significant challenges with affording her Eliquis and Spiriva. She has yet to call the [facility] counsellors to see if she qualifies for LIS but states she is planning to call them soon. She continues to get her medications through [facility] and is still unsure whether she would prefer to get her medications through [facility]. Instructed patient to inform me when she learns if she is eligible for LIS so we may begin PAP process if necessary. The patient has not had any side effects with their medicines. The patient would not like the CPP to call them. The patient has no recommendations for improvements in managing care. The patient has misc. comments: Since patient has moved to the [place] area, she has felt like there hasn't been much for her to do socially to keep her busy. She has been thinking about getting a part time job to help get out of the bed in the morning.   Recommendations:  None at this time. Will continue to monitor patient for potential patient assistance tools for her medications.   Garey Ham Clinical Pharmacist Corinda Gubler at [facility]  [phone]

## 2019-12-14 ENCOUNTER — Telehealth: Payer: Self-pay | Admitting: Family Medicine

## 2019-12-14 NOTE — Telephone Encounter (Signed)
Pharmacist called to inquire status on a fax they sent 12/10/19. Imipramine is a high risk for patient's age and they require clarification on use. Also verified correct fax number for our office.

## 2019-12-15 NOTE — Telephone Encounter (Signed)
Spoke with pharmacy and patient per patient she does not take more than 2 pills a day pharmacy aware of this and will discontinue Rx for taking 2-3 nightly.

## 2019-12-16 ENCOUNTER — Telehealth: Payer: Self-pay | Admitting: Family Medicine

## 2019-12-16 DIAGNOSIS — J449 Chronic obstructive pulmonary disease, unspecified: Secondary | ICD-10-CM | POA: Diagnosis not present

## 2019-12-16 NOTE — Telephone Encounter (Signed)
Patient is calling and wanted to see if she can speak to someone regarding getting her handicap sticker renewed. CB is [phone]

## 2019-12-17 NOTE — Telephone Encounter (Signed)
Patient aware that form will be filled out and signed, we will give her a call when ready to pick up.

## 2019-12-23 ENCOUNTER — Other Ambulatory Visit: Payer: Self-pay | Admitting: Family Medicine

## 2019-12-23 DIAGNOSIS — I10 Essential (primary) hypertension: Secondary | ICD-10-CM

## 2019-12-28 ENCOUNTER — Other Ambulatory Visit: Payer: Self-pay

## 2019-12-28 ENCOUNTER — Ambulatory Visit: Payer: Medicare Other | Admitting: Cardiology

## 2019-12-28 ENCOUNTER — Encounter: Payer: Self-pay | Admitting: Cardiology

## 2019-12-28 VITALS — BP 124/82 | HR 80 | Ht 66.0 in | Wt 186.0 lb

## 2019-12-28 DIAGNOSIS — I471 Supraventricular tachycardia: Secondary | ICD-10-CM | POA: Diagnosis not present

## 2019-12-28 DIAGNOSIS — I491 Atrial premature depolarization: Secondary | ICD-10-CM

## 2019-12-28 DIAGNOSIS — E785 Hyperlipidemia, unspecified: Secondary | ICD-10-CM | POA: Diagnosis not present

## 2019-12-28 DIAGNOSIS — I4891 Unspecified atrial fibrillation: Secondary | ICD-10-CM | POA: Diagnosis not present

## 2019-12-28 DIAGNOSIS — Z720 Tobacco use: Secondary | ICD-10-CM

## 2019-12-28 DIAGNOSIS — I10 Essential (primary) hypertension: Secondary | ICD-10-CM | POA: Diagnosis not present

## 2019-12-28 DIAGNOSIS — Z7901 Long term (current) use of anticoagulants: Secondary | ICD-10-CM | POA: Diagnosis not present

## 2019-12-28 NOTE — Progress Notes (Signed)
Cardiology Office Note:    Date:  12/28/2019   ID:  Tiffany Jennings, DOB 1934/10/04, MRN [id number]  PCP:  Mliss Sax, MD  Cardiologist:  No primary care provider on file.  Electrophysiologist:  None   Referring MD: Mliss Sax,*   Chief Complaint  Patient presents with  . Follow-up    6 months  . Atrial Fibrillation   History of Present Illness:    Tiffany Jennings is a 85 y.o. female with a hx of atrial fibrillation on Eliquis, COPD on oxygen, hypertension, hypothyroidism, hyperlipidemia, DVT/PE who presents for follow-up.  She was initially seen on 03/30/2019 for evaluation of atrial fibrillation.  She had moved to the area from [facility].  Was in hospital for pneumonia several years ago and told she had AF.   She denies any symptoms from atrial fibrillation, and does not think she has had any AF since her hospitalization.  She has been on amiodarone 100 mg daily.  The notes from her PCP in [place] indicate that amiodarone was to be discontinued due to her chronic respiratory disease.  However she continued to take amiodarone.  She underwent a TTE in July 2020, which showed normal EF, mild concentric hypertrophy, moderate diastolic dysfunction, no significant valvular disease reported.  She has been on Eliquis and diltiazem for her AF.  Amiodarone was discontinued at initial clinic visit in 03/30/2019.  Zio patch x14 days on 07/29/2019 showed very frequent PACs (18.1% of beats) with numerous episodes of SVT, longest lasting 7 beats.  No evidence of atrial fibrillation.  Echocardiogram on 08/12/2019 showed normal biventricular function, no significant valvular disease.  Since her last clinic visit, she reports that she has been doing well.  She was walking 25 minutes daily but has not been walking over the last 2 weeks.  She denies any chest pain.  Reports chronic dyspnea is stable.  Denies any lightheadedness, syncope, palpitations, or lower extremity edema.  She has  been taking Eliquis, denies any bleeding issues.  She is currently smoking 2 cigarettes/day.    Past Medical History:  Diagnosis Date  . A-fib (HCC)   . COPD (chronic obstructive pulmonary disease) (HCC)   . Hyperlipidemia   . Hypertension     Past Surgical History:  Procedure Laterality Date  . CAROTID ANGIOGRAM      Current Medications: Current Meds  Medication Sig  . albuterol (VENTOLIN HFA) 108 (90 Base) MCG/ACT inhaler INHALE 2 PUFFS INTO THE LUNGS EVERY 6 HOURS AS NEEDED FOR WHEEZING OR SHORTNESS OF BREATH  . atorvastatin (LIPITOR) 10 MG tablet   . carboxymethylcellulose (REFRESH PLUS) 0.5 % SOLN 1 drop 3 (three) times daily as needed.  . diltiazem (CARDIZEM CD) 120 MG 24 hr capsule Take 1 capsule (120 mg total) by mouth daily.  Marland Kitchen donepezil (ARICEPT) 10 MG tablet TAKE 1 TABLET(10 MG) BY MOUTH AT BEDTIME  . ELIQUIS 5 MG TABS tablet TAKE 1 TABLET(5 MG) BY MOUTH TWICE DAILY  . furosemide (LASIX) 20 MG tablet TAKE 1 TABLET(20 MG) BY MOUTH DAILY  . imipramine (TOFRANIL) 50 MG tablet May take 1-2 nightly.  . levothyroxine (SYNTHROID) 75 MCG tablet TAKE 1 TABLET(75 MCG) BY MOUTH DAILY  . [DISCONTINUED] Fluticasone-Umeclidin-Vilant (TRELEGY ELLIPTA) 100-62.5-25 MCG/INH AEPB Inhale 1 puff into the lungs daily.     Allergies:   Codeine   Social History   Socioeconomic History  . Marital status: Single    Spouse name: Not on file  . Number of children: Not on file  .  Years of education: Not on file  . Highest education level: Not on file  Occupational History  . Not on file  Tobacco Use  . Smoking status: Current Every Day Smoker    Packs/day: 0.25    Years: 50.00    Pack years: 12.50    Types: Cigarettes  . Smokeless tobacco: Never Used  . Tobacco comment: 2 cigs a day  Substance and Sexual Activity  . Alcohol use: Not Currently  . Drug use: Never  . Sexual activity: Not on file  Other Topics Concern  . Not on file  Social History Narrative  . Not on file    Social Determinants of Health   Financial Resource Strain: High Risk  . Difficulty of Paying Living Expenses: Hard  Food Insecurity: No Food Insecurity  . Worried About Programme researcher, broadcasting/film/video in the Last Year: Never true  . Ran Out of Food in the Last Year: Never true  Transportation Needs: No Transportation Needs  . Lack of Transportation (Medical): No  . Lack of Transportation (Non-Medical): No  Physical Activity:   . Days of Exercise per Week:   . Minutes of Exercise per Session:   Stress:   . Feeling of Stress :   Social Connections:   . Frequency of Communication with Friends and Family:   . Frequency of Social Gatherings with Friends and Family:   . Attends Religious Services:   . Active Member of Clubs or Organizations:   . Attends Banker Meetings:   Marland Kitchen Marital Status:      Family History: No relevant family history of heart diseaseNo relevant family history of heart disease  ROS:   Please see the history of present illness.     All other systems reviewed and are negative.  EKGs/Labs/Other Studies Reviewed:    The following studies were reviewed today:   EKG:  EKG is ordered today.  The ekg ordered today demonstrates normal sinus rhythm, PACs, rate 80, nonspecific T wave flattening  Recent Labs: 03/06/2019: ALT 13; Hemoglobin 11.9; Platelets 361; TSH 0.59 06/22/2019: BUN 21; Creatinine, Ser 1.30; Potassium 4.6; Sodium 142  Recent Lipid Panel    Component Value Date/Time   LDLDIRECT 86 03/06/2019 1142    Physical Exam:    VS:  BP 124/82 (BP Location: Right Arm, Patient Position: Sitting, Cuff Size: Normal)   Pulse 80   Ht 5\' 6"  (1.676 m)   Wt 186 lb (84.4 kg)   BMI 30.02 kg/m     Wt Readings from Last 3 Encounters:  12/28/19 186 lb (84.4 kg)  10/05/19 192 lb 3.2 oz (87.2 kg)  06/29/19 198 lb (89.8 kg)     GEN:  in no acute distress, on oxygen HEENT: Normal NECK: No JVD LYMPHATICS: No lymphadenopathy CARDIAC: Irregular, normal rate, no  murmurs, rubs, gallops RESPIRATORY: Bilateral expiratory wheezing ABDOMEN: Soft, non-tender, non-distended MUSCULOSKELETAL:  No edema; No deformity  SKIN: Warm and dry NEUROLOGIC:  Alert and oriented x 3 PSYCHIATRIC:  Normal affect   ASSESSMENT:    1. Atrial fibrillation, unspecified type (HCC)   2. Essential hypertension   3. Hyperlipidemia, unspecified hyperlipidemia type   4. Current use of long term anticoagulation   5. SVT (supraventricular tachycardia) (HCC)   6. PAC (premature atrial contraction)   7. Tobacco use    PLAN:    In order of problems listed above:  Atrial fibrillation: On diltiazem 120 mg and Eliquis 5 mg twice daily.  CHADS-VASc 4 (HTN, agex2, female).  TTE from 08/12/19 shows normal biventricular function, no significant valvular disease.  Amiodarone discontinued 03/2019 due to risk of pulmonary toxicity given her COPD.  Zio patch x14 days on 07/29/2019 showed very frequent PACs (18.1% of beats) with numerous episodes of SVT, longest lasting 7 beats.  No evidence of atrial fibrillation. -Continue diltiazem for rate control -Continue Eliquis for anticoagulation  Frequent PACs/SVT: Zio patch x14 days on 07/29/2019 showed very frequent PACs (18.1% of beats) with numerous episodes of SVT, longest lasting 7 beats.  Continue diltiazem.  Will check CMP, magnesium, TSH  Hypertension: On diltiazem 120 mg.  Appears controlled  Hyperlipidemia: On atorvastatin 10 mg.  LDL 86 on 03/06/2019.  Will check lipid panel  Tobacco use: smoked x 30 years for 1 ppd.  Currently is down to 2 cigarettes per day.  Risks of tobacco use were discussed and cessation strongly encouraged  Leg pain: ABIs normal, no evidence of PAD  COPD: on home oxygen.  Follows with pulmonology.  RTC in 6 months   Medication Adjustments/Labs and Tests Ordered: Current medicines are reviewed at length with the patient today.  Concerns regarding medicines are outlined above.  Orders Placed This Encounter   Procedures  . CBC  . Comprehensive metabolic panel  . Magnesium  . Lipid panel  . TSH  . EKG 12-Lead   No orders of the defined types were placed in this encounter.   Patient Instructions  Medication Instructions:  Your physician recommends that you continue on your current medications as directed. Please refer to the Current Medication list given to you today.  *If you need a refill on your cardiac medications before your next appointment, please call your pharmacy*   Lab Work: CMET, CBC, Mag, TSH, Lipid today  If you have labs (blood work) drawn today and your tests are completely normal, you will receive your results only by: Marland Kitchen MyChart Message (if you have MyChart) OR . A paper copy in the mail If you have any lab test that is abnormal or we need to change your treatment, we will call you to review the results.  Follow-Up: At [facility], you and your health needs are our priority.  As part of our continuing mission to provide you with exceptional heart care, we have created designated Provider Care Teams.  These Care Teams include your primary Cardiologist (physician) and Advanced Practice Providers (APPs -  Physician Assistants and Nurse Practitioners) who all work together to provide you with the care you need, when you need it.  We recommend signing up for the patient portal called "MyChart".  Sign up information is provided on this After Visit Summary.  MyChart is used to connect with patients for Virtual Visits (Telemedicine).  Patients are able to view lab/test results, encounter notes, upcoming appointments, etc.  Non-urgent messages can be sent to your provider as well.   To learn more about what you can do with MyChart, go to [email or web address].    Your next appointment:   6 month(s)  The format for your next appointment:   In Person  Provider:   Epifanio Lesches, MD         Signed, Little Ishikawa, MD  12/28/2019 11:07 PM    [facility]

## 2019-12-28 NOTE — Patient Instructions (Signed)
Medication Instructions:  Your physician recommends that you continue on your current medications as directed. Please refer to the Current Medication list given to you today.  *If you need a refill on your cardiac medications before your next appointment, please call your pharmacy*   Lab Work: CMET, CBC, Mag, TSH, Lipid today  If you have labs (blood work) drawn today and your tests are completely normal, you will receive your results only by: Marland Kitchen MyChart Message (if you have MyChart) OR . A paper copy in the mail If you have any lab test that is abnormal or we need to change your treatment, we will call you to review the results.  Follow-Up: At [facility], you and your health needs are our priority.  As part of our continuing mission to provide you with exceptional heart care, we have created designated Provider Care Teams.  These Care Teams include your primary Cardiologist (physician) and Advanced Practice Providers (APPs -  Physician Assistants and Nurse Practitioners) who all work together to provide you with the care you need, when you need it.  We recommend signing up for the patient portal called "MyChart".  Sign up information is provided on this After Visit Summary.  MyChart is used to connect with patients for Virtual Visits (Telemedicine).  Patients are able to view lab/test results, encounter notes, upcoming appointments, etc.  Non-urgent messages can be sent to your provider as well.   To learn more about what you can do with MyChart, go to [email or web address].    Your next appointment:   6 month(s)  The format for your next appointment:   In Person  Provider:   Epifanio Lesches, MD

## 2019-12-29 LAB — COMPREHENSIVE METABOLIC PANEL
ALT: 11 IU/L (ref 0–32)
AST: 15 IU/L (ref 0–40)
Albumin/Globulin Ratio: 1.6 (ref 1.2–2.2)
Albumin: 4.4 g/dL (ref 3.6–4.6)
Alkaline Phosphatase: 166 IU/L — ABNORMAL HIGH (ref 48–121)
BUN/Creatinine Ratio: 17 (ref 12–28)
BUN: 23 mg/dL (ref 8–27)
Bilirubin Total: 0.3 mg/dL (ref 0.0–1.2)
CO2: 32 mmol/L — ABNORMAL HIGH (ref 20–29)
Calcium: 10.2 mg/dL (ref 8.7–10.3)
Chloride: 95 mmol/L — ABNORMAL LOW (ref 96–106)
Creatinine, Ser: 1.38 mg/dL — ABNORMAL HIGH (ref 0.57–1.00)
GFR calc Af Amer: 41 mL/min/{1.73_m2} — ABNORMAL LOW (ref >59)
GFR calc non Af Amer: 36 mL/min/{1.73_m2} — ABNORMAL LOW (ref >59)
Globulin, Total: 2.7 g/dL (ref 1.5–4.5)
Glucose: 118 mg/dL — ABNORMAL HIGH (ref 65–99)
Potassium: 4.7 mmol/L (ref 3.5–5.2)
Sodium: 142 mmol/L (ref 134–144)
Total Protein: 7.1 g/dL (ref 6.0–8.5)

## 2019-12-29 LAB — CBC
Hematocrit: 40.6 % (ref 34.0–46.6)
Hemoglobin: 13.5 g/dL (ref 11.1–15.9)
MCH: 29.9 pg (ref 26.6–33.0)
MCHC: 33.3 g/dL (ref 31.5–35.7)
MCV: 90 fL (ref 79–97)
Platelets: 369 10*3/uL (ref 150–450)
RBC: 4.52 x10E6/uL (ref 3.77–5.28)
RDW: 13.3 % (ref 11.7–15.4)
WBC: 11.5 10*3/uL — ABNORMAL HIGH (ref 3.4–10.8)

## 2019-12-29 LAB — MAGNESIUM: Magnesium: 2 mg/dL (ref 1.6–2.3)

## 2019-12-29 LAB — LIPID PANEL
Chol/HDL Ratio: 2.4 ratio (ref 0.0–4.4)
Cholesterol, Total: 212 mg/dL — ABNORMAL HIGH (ref 100–199)
HDL: 87 mg/dL (ref >39)
LDL Chol Calc (NIH): 105 mg/dL — ABNORMAL HIGH (ref 0–99)
Triglycerides: 114 mg/dL (ref 0–149)
VLDL Cholesterol Cal: 20 mg/dL (ref 5–40)

## 2019-12-29 LAB — TSH: TSH: 0.234 u[IU]/mL — ABNORMAL LOW (ref 0.450–4.500)

## 2019-12-31 ENCOUNTER — Other Ambulatory Visit: Payer: Self-pay | Admitting: *Deleted

## 2019-12-31 MED ORDER — ATORVASTATIN CALCIUM 20 MG PO TABS: 20.0000 mg | ORAL_TABLET | Freq: Every day | ORAL | 3 refills | Status: DC

## 2020-01-06 ENCOUNTER — Other Ambulatory Visit: Payer: Self-pay

## 2020-01-07 ENCOUNTER — Ambulatory Visit (INDEPENDENT_AMBULATORY_CARE_PROVIDER_SITE_OTHER): Payer: Medicare Other | Admitting: Family Medicine

## 2020-01-07 ENCOUNTER — Encounter: Payer: Self-pay | Admitting: Family Medicine

## 2020-01-07 VITALS — BP 132/78 | HR 69 | Temp 98.4°F | Ht 66.0 in | Wt 182.0 lb

## 2020-01-07 DIAGNOSIS — R748 Abnormal levels of other serum enzymes: Secondary | ICD-10-CM

## 2020-01-07 DIAGNOSIS — I10 Essential (primary) hypertension: Secondary | ICD-10-CM | POA: Diagnosis not present

## 2020-01-07 DIAGNOSIS — E039 Hypothyroidism, unspecified: Secondary | ICD-10-CM | POA: Diagnosis not present

## 2020-01-07 NOTE — Progress Notes (Addendum)
Established Patient Office Visit  Subjective:  Patient ID: Tiffany Jennings, female    DOB: [birth year]  Age: 85 y.o. MRN: [id number]  CC: No chief complaint on file.   HPI Shawnia Vizcarrondo presents for recent lab work showed a depressed TSH and an elevated alkaline phosphatase.  Patient assures me that she has been taking her 75 mcg of levothyroxine daily on a fasting stomach.  She has not doubled up on her medicine.  Her liver enzymes have been normal.  She does not drink alcohol.  She denies any acute bony pain except for her usual joint aches and pains.  Tells me that she actually had a normal bone density on a tree test few years ago.  She admits that she enjoys drinking Pepsi-Cola.  She continues with supplemental oxygen.  She is currently sleeping on one pillow.  Past Medical History:  Diagnosis Date   A-fib [facility])    COPD (chronic obstructive pulmonary disease) ([place])    Hyperlipidemia    Hypertension     Past Surgical History:  Procedure Laterality Date   CAROTID ANGIOGRAM      History reviewed. No pertinent family history.  Social History   Socioeconomic History   Marital status: Single    Spouse name: Not on file   Number of children: Not on file   Years of education: Not on file   Highest education level: Not on file  Occupational History   Not on file  Tobacco Use   Smoking status: Current Every Day Smoker    Packs/day: 0.25    Years: 50.00    Pack years: 12.50    Types: Cigarettes   Smokeless tobacco: Never Used   Tobacco comment: 2 cigs a day  Substance and Sexual Activity   Alcohol use: Not Currently   Drug use: Never   Sexual activity: Not on file  Other Topics Concern   Not on file  Social History Narrative   Not on file   Social Determinants of Health   Financial Resource Strain: High Risk   Difficulty of Paying Living Expenses: Hard  Food Insecurity: No Food Insecurity   Worried About Running Out of Food in the Last Year:  Never true   Ran Out of Food in the Last Year: Never true  Transportation Needs: No Transportation Needs   Lack of Transportation (Medical): No   Lack of Transportation (Non-Medical): No  Physical Activity:    Days of Exercise per Week:    Minutes of Exercise per Session:   Stress:    Feeling of Stress :   Social Connections:    Frequency of Communication with Friends and Family:    Frequency of Social Gatherings with Friends and Family:    Attends Religious Services:    Active Member of Clubs or Organizations:    Attends Music therapist:    Marital Status:   Intimate Partner Violence:    Fear of Current or Ex-Partner:    Emotionally Abused:    Physically Abused:    Sexually Abused:     Outpatient Medications Prior to Visit  Medication Sig Dispense Refill   albuterol (VENTOLIN HFA) 108 (90 Base) MCG/ACT inhaler INHALE 2 PUFFS INTO THE LUNGS EVERY 6 HOURS AS NEEDED FOR WHEEZING OR SHORTNESS OF BREATH 18 g 1   atorvastatin (LIPITOR) 20 MG tablet Take 1 tablet (20 mg total) by mouth daily. 90 tablet 3   carboxymethylcellulose (REFRESH PLUS) 0.5 % SOLN 1 drop 3 (three) times daily  as needed.     diltiazem (CARDIZEM CD) 120 MG 24 hr capsule Take 1 capsule (120 mg total) by mouth daily. 90 capsule 3   donepezil (ARICEPT) 10 MG tablet TAKE 1 TABLET(10 MG) BY MOUTH AT BEDTIME 90 tablet 1   ELIQUIS 5 MG TABS tablet TAKE 1 TABLET(5 MG) BY MOUTH TWICE DAILY 60 tablet 5   furosemide (LASIX) 20 MG tablet TAKE 1 TABLET(20 MG) BY MOUTH DAILY 30 tablet 2   imipramine (TOFRANIL) 50 MG tablet May take 1-2 nightly. 60 tablet 5   levothyroxine (SYNTHROID) 75 MCG tablet TAKE 1 TABLET(75 MCG) BY MOUTH DAILY 90 tablet 1   SPIRIVA RESPIMAT 2.5 MCG/ACT AERS INHALE 2 PUFFS INTO THE LUNGS DAILY 4 g 5   No facility-administered medications prior to visit.    Allergies  Allergen Reactions   Codeine     ROS Review of Systems  Constitutional: Negative for  diaphoresis, fatigue, fever and unexpected weight change.  HENT: Negative.   Eyes: Negative for photophobia and visual disturbance.  Respiratory: Negative.   Cardiovascular: Negative.   Gastrointestinal: Negative.   Endocrine: Negative for polyphagia.  Genitourinary: Negative.   Musculoskeletal: Positive for gait problem.  Skin: Negative for pallor and rash.  Allergic/Immunologic: Negative for immunocompromised state.  Neurological: Negative for light-headedness and numbness.  Hematological: Does not bruise/bleed easily.  Psychiatric/Behavioral: Negative.       Objective:    Physical Exam Constitutional:      General: She is not in acute distress.    Appearance: Normal appearance. She is not ill-appearing or toxic-appearing.  HENT:     Head: Normocephalic and atraumatic.     Right Ear: External ear normal.     Left Ear: External ear normal.     Mouth/Throat:     Pharynx: Oropharynx is clear. No oropharyngeal exudate or posterior oropharyngeal erythema.  Eyes:     General: No scleral icterus.       Right eye: No discharge.        Left eye: No discharge.     Conjunctiva/sclera: Conjunctivae normal.  Cardiovascular:     Rate and Rhythm: Normal rate and regular rhythm.  Pulmonary:     Effort: Pulmonary effort is normal.     Breath sounds: Normal breath sounds. No rhonchi or rales.  Abdominal:     General: Bowel sounds are normal.  Musculoskeletal:     Right lower leg: No edema.     Left lower leg: No edema.  Skin:    General: Skin is warm and dry.  Neurological:     Mental Status: She is alert and oriented to person, place, and time.  Psychiatric:        Mood and Affect: Mood normal.        Behavior: Behavior normal.     BP (!) 132/78 (BP Location: Left Arm, Patient Position: Sitting, Cuff Size: Normal)    Pulse 69    Temp 98.4 F (36.9 C) (Oral)    Ht _0  (1.676 m)    Wt 182 lb (82.6 kg)    SpO2 94%    BMI 29.38 kg/m  Wt Readings from Last 3 Encounters:    01/07/20 182 lb (82.6 kg)  12/28/19 186 lb (84.4 kg)  10/05/19 192 lb 3.2 oz (87.2 kg)     Health Maintenance Due  Topic Date Due   URINE MICROALBUMIN  Never done   PNA vac Low Risk Adult (1 of 2 - PCV13) Never done   INFLUENZA  VACCINE  01/17/2020    There are no preventive care reminders to display for this patient.  Lab Results  Component Value Date   TSH 0.10 (L) 01/07/2020   Lab Results  Component Value Date   WBC 10.7 (H) 01/07/2020   HGB 13.2 01/07/2020   HCT 40.1 01/07/2020   MCV 92.2 01/07/2020   PLT 313.0 01/07/2020   Lab Results  Component Value Date   NA 141 01/07/2020   K 4.6 01/07/2020   CO2 35 (H) 01/07/2020   GLUCOSE 115 (H) 01/07/2020   BUN 24 (H) 01/07/2020   CREATININE 1.50 (H) 01/07/2020   BILITOT 0.5 01/07/2020   ALKPHOS 133 (H) 01/07/2020   ALKPHOS 164 (H) 01/07/2020   AST 15 01/07/2020   ALT 10 01/07/2020   PROT 7.2 01/07/2020   ALBUMIN 4.0 01/07/2020   CALCIUM 10.2 01/07/2020   GFR 33.20 (L) 01/07/2020   Lab Results  Component Value Date   CHOL 212 (H) 12/28/2019   Lab Results  Component Value Date   HDL 87 12/28/2019   Lab Results  Component Value Date   LDLCALC 105 (H) 12/28/2019   Lab Results  Component Value Date   TRIG 114 12/28/2019   Lab Results  Component Value Date   CHOLHDL 2.4 12/28/2019   Lab Results  Component Value Date   HGBA1C 6.3 06/22/2019      Assessment & Plan:   Problem List Items Addressed This Visit      Cardiovascular and Mediastinum   Essential hypertension   Relevant Orders   CBC (Completed)   Comprehensive metabolic panel (Completed)   Urinalysis, Routine w reflex microscopic (Completed)     Endocrine   Hypothyroidism - Primary   Relevant Medications   levothyroxine (SYNTHROID) 50 MCG tablet   Other Relevant Orders   TSH (Completed)    Other Visit Diagnoses    Elevated alkaline phosphatase level       Relevant Orders   Comprehensive metabolic panel (Completed)   Alkaline  Phosphatase, Isoenzymes (Completed)   US Abdomen Complete      Meds ordered this encounter  Medications   levothyroxine (SYNTHROID) 50 MCG tablet    Sig: Take 1 tablet (50 mcg total) by mouth daily before breakfast.    Dispense:  90 tablet    Refill:  0    Follow-up: Return in about 3 months (around 04/08/2020).  Advised patient to discontinue Pepsi in favor of plain water.  She may switch to a cup of coffee in the morning.  Explained that the caffeine and Pepsi could be irritating her heart and leading to develop dehydration and also a decreasing GFR.  Repeat TSH.  Isoenzymes for alkaline phosphatase.  Libby Maw, MD   8/1 addendum: TSH remains low. Have decreased dose of levothyroxine. Normal alk phos fractionation. Have ordered abd. [place].

## 2020-01-08 LAB — COMPREHENSIVE METABOLIC PANEL
ALT: 10 U/L (ref 0–35)
AST: 15 U/L (ref 0–37)
Albumin: 4 g/dL (ref 3.5–5.2)
Alkaline Phosphatase: 133 U/L — ABNORMAL HIGH (ref 39–117)
BUN: 24 mg/dL — ABNORMAL HIGH (ref 6–23)
CO2: 35 mEq/L — ABNORMAL HIGH (ref 19–32)
Calcium: 10.2 mg/dL (ref 8.4–10.5)
Chloride: 97 mEq/L (ref 96–112)
Creatinine, Ser: 1.5 mg/dL — ABNORMAL HIGH (ref 0.40–1.20)
GFR: 33.2 mL/min — ABNORMAL LOW (ref >60.00)
Glucose, Bld: 115 mg/dL — ABNORMAL HIGH (ref 70–99)
Potassium: 4.6 mEq/L (ref 3.5–5.1)
Sodium: 141 mEq/L (ref 135–145)
Total Bilirubin: 0.5 mg/dL (ref 0.2–1.2)
Total Protein: 7.2 g/dL (ref 6.0–8.3)

## 2020-01-08 LAB — URINALYSIS, ROUTINE W REFLEX MICROSCOPIC
Bilirubin Urine: NEGATIVE
Hgb urine dipstick: NEGATIVE
Ketones, ur: NEGATIVE
Nitrite: NEGATIVE
Specific Gravity, Urine: 1.015 (ref 1.000–1.030)
Total Protein, Urine: NEGATIVE
Urine Glucose: NEGATIVE
Urobilinogen, UA: 0.2 (ref 0.0–1.0)
pH: 6.5 (ref 5.0–8.0)

## 2020-01-08 LAB — CBC
HCT: 40.1 % (ref 36.0–46.0)
Hemoglobin: 13.2 g/dL (ref 12.0–15.0)
MCHC: 33 g/dL (ref 30.0–36.0)
MCV: 92.2 fl (ref 78.0–100.0)
Platelets: 313 10*3/uL (ref 150.0–400.0)
RBC: 4.35 Mil/uL (ref 3.87–5.11)
RDW: 14.8 % (ref 11.5–15.5)
WBC: 10.7 10*3/uL — ABNORMAL HIGH (ref 4.0–10.5)

## 2020-01-08 LAB — TSH: TSH: 0.1 u[IU]/mL — ABNORMAL LOW (ref 0.35–4.50)

## 2020-01-11 LAB — ALKALINE PHOSPHATASE, ISOENZYMES
Alkaline Phosphatase: 164 IU/L — ABNORMAL HIGH (ref 48–121)
BONE FRACTION: 21 % (ref 14–68)
INTESTINAL FRAC.: 0 % (ref 0–18)
LIVER FRACTION: 79 % (ref 18–85)

## 2020-01-16 DIAGNOSIS — J449 Chronic obstructive pulmonary disease, unspecified: Secondary | ICD-10-CM | POA: Diagnosis not present

## 2020-01-18 ENCOUNTER — Telehealth: Payer: Self-pay | Admitting: Family Medicine

## 2020-01-18 MED ORDER — LEVOTHYROXINE SODIUM 50 MCG PO TABS: 50.0000 ug | ORAL_TABLET | Freq: Every day | ORAL | 0 refills | Status: DC

## 2020-01-18 NOTE — Telephone Encounter (Signed)
Pt would like call back to discuss getting ultrasound, CB: [phone]

## 2020-01-18 NOTE — Addendum Note (Signed)
Addended by: Nadene Rubins A on: 01/18/2020 11:01 AM   Modules accepted: Orders

## 2020-01-19 NOTE — Telephone Encounter (Signed)
Patient and daughter aware of U/S and reason for this.

## 2020-01-20 ENCOUNTER — Ambulatory Visit
Admission: RE | Admit: 2020-01-20 | Discharge: 2020-01-20 | Disposition: A | Discharge status: Home / Self Care | Payer: Medicare Other | Source: Ambulatory Visit | Attending: Family Medicine | Admitting: Family Medicine

## 2020-01-20 DIAGNOSIS — R748 Abnormal levels of other serum enzymes: Secondary | ICD-10-CM

## 2020-01-20 DIAGNOSIS — I700 Atherosclerosis of aorta: Secondary | ICD-10-CM | POA: Diagnosis not present

## 2020-01-20 DIAGNOSIS — I712 Thoracic aortic aneurysm, without rupture: Secondary | ICD-10-CM | POA: Diagnosis not present

## 2020-01-28 ENCOUNTER — Telehealth: Payer: Self-pay | Admitting: Family Medicine

## 2020-01-28 NOTE — Telephone Encounter (Addendum)
Patient daughter is calling and wanted to see if results from her ultra sound were back, please advise. CB is [phone]

## 2020-01-28 NOTE — Telephone Encounter (Signed)
Patients daughter calling for results on U/S they are in but not been viewed please advise.

## 2020-01-29 NOTE — Telephone Encounter (Signed)
Patient aware of message below and will call back to schedule appointment

## 2020-01-29 NOTE — Telephone Encounter (Signed)
[place] was okay. We will recheck blood work in 3 months.

## 2020-02-02 ENCOUNTER — Telehealth: Payer: Self-pay

## 2020-02-02 NOTE — Progress Notes (Signed)
Spoke to patient to confirmed patient telephone appointment on 02/03/2020 for CCM at 1:00 PM with Angelena Sole the Clinical pharmacist.    Everlean Cherry Clinical Pharmacist Assistant [phone]

## 2020-02-03 ENCOUNTER — Ambulatory Visit: Payer: Medicare Other

## 2020-02-03 DIAGNOSIS — E039 Hypothyroidism, unspecified: Secondary | ICD-10-CM

## 2020-02-03 DIAGNOSIS — I10 Essential (primary) hypertension: Secondary | ICD-10-CM

## 2020-02-03 NOTE — Chronic Care Management (AMB) (Signed)
Chronic Care Management Pharmacy  Name: Tiffany Jennings Whitford  MRN: [id number] DOB: 03-24-1935  Chief Complaint/ HPI  Tiffany Jennings Weldy,  85 y.o. , female presents for their Follow-Up CCM visit with the clinical pharmacist via telephone.  PCP : Mliss SaxKremer, William Alfred, MD  Their chronic conditions include: A-Fib, Hypertension, hypothyroidism, depression, hyperlipidemia, COPD, tobacco abuse, chronic kidney disease   Office Visits: 01/07/20: Patient presented to Dr. Doreene BurkeKremer for follow-up. TSH 0.1, levothyroxine decreased to 50 mcg daily.  09/30/19: Patient presented to Mady HaagensenVictoria Britt, RN for AWV.  06/22/19: Patient presented to Dr. Doreene BurkeKremer for follow-up. A1c slightly elevated, kidney function stable. No medication changes made.   Consult Visit: 12/28/19: Patient presented to Dr. Bjorn PippinSchumann (Cardiology) for follow-up. Trelegy stopped (not taking). Thyroid levels low.  10/05/19: Patient presented to Dr. Wynona Neatlalere (pulmonology) for COPD follow-up. Trelegy unaffordable, back on Spiriva. Patient with ongoing shortness of breath/wheezing, stable. Patient counselled to start NAC BID and Mucinex PRN for secretions.   06/29/19: Patient presented to Dr. Bjorn PippinSchumann (cardiology) for A-Fib follow-up. Patient with no recurrence of A-Fib symptoms. Zio monitor placed, which showed extra beats, but sinus rhythm. No medication changes made.   Medications: Outpatient Encounter Medications as of 02/03/2020  Medication Sig  . albuterol (VENTOLIN HFA) 108 (90 Base) MCG/ACT inhaler INHALE 2 PUFFS INTO THE LUNGS EVERY 6 HOURS AS NEEDED FOR WHEEZING OR SHORTNESS OF BREATH  . atorvastatin (LIPITOR) 20 MG tablet Take 1 tablet (20 mg total) by mouth daily.  . carboxymethylcellulose (REFRESH PLUS) 0.5 % SOLN 1 drop 3 (three) times daily as needed.  . diltiazem (CARDIZEM CD) 120 MG 24 hr capsule Take 1 capsule (120 mg total) by mouth daily.  Marland Kitchen. donepezil (ARICEPT) 10 MG tablet TAKE 1 TABLET(10 MG) BY MOUTH AT BEDTIME  . ELIQUIS 5 MG TABS  tablet TAKE 1 TABLET(5 MG) BY MOUTH TWICE DAILY  . furosemide (LASIX) 20 MG tablet TAKE 1 TABLET(20 MG) BY MOUTH DAILY  . imipramine (TOFRANIL) 50 MG tablet May take 1-2 nightly.  . levothyroxine (SYNTHROID) 50 MCG tablet Take 1 tablet (50 mcg total) by mouth daily before breakfast.  . SPIRIVA RESPIMAT 2.5 MCG/ACT AERS INHALE 2 PUFFS INTO THE LUNGS DAILY   No facility-administered encounter medications on file as of 02/03/2020.   SDOH Interventions     Most Recent Value  SDOH Interventions  Financial Strain Interventions Intervention Not Indicated  Transportation Interventions Intervention Not Indicated     Current Diagnosis/Assessment:  Goals Addressed            This Visit's Progress   . Chronic Care Management       CARE PLAN ENTRY  Current Barriers:  . Chronic Disease Management support, education, and care coordination needs related to Hyperlipidemia, COPD, Chronic Kidney Disease, Hypothyroidism, and Pre-Diabetes   Hyperlipidemia . Pharmacist Clinical Goal(s): o Over the next 90 days, patient will work with PharmD and providers to maintain LDL goal < 100 . Current regimen:  o Atorvastatin 10 mg   Pre-Diabetes . Pharmacist Clinical Goal(s): o Over the next 90 days, patient will work with PharmD and providers to prevent progression to Diabetes . Interventions: o Recommend following a low-carbohydrate diet and incorporating more non-starchy vegetables.  . Patient self care activities - Over the next 90 days, patient will: o Limit intake of Starchy Foods o Work to increase activity to 150 minutes of moderate-intensity exercise weekly  Atrial Fibrillation . Pharmacist Clinical Goal(s) o Over the next 90 days, patient will work with PharmD and providers to  prevent symptoms of heart flutter . Current regimen:  o Diltiazem 120 mg  o Eliquis 5 mg   Medication management . Pharmacist Clinical Goal(s): o Over the next 90 days, patient will work with PharmD and providers  to maintain optimal medication adherence . Current pharmacy: [facility] . Interventions o Comprehensive medication review performed. o Continue current medication management strategy. If you have any questions about me helping dispense your medications, let me know.  . Patient self care activities - Over the next 90 days, patient will: o Take medications as prescribed o Report any questions or concerns to PharmD and/or provider(s) o Reach out to the [facility] counselors to see if you qualify for the Extra Help Program to help with medication costs. You can reach them at [phone]       AFIB   Patient is currently rate controlled.  Patient has failed these meds in past: Amiodarone (pulmonary toxicity risk) Patient is currently controlled on the following medications:   Eliquis 5 mg BID   Diltiazem CD 120 mg daily  We discussed: Denies unusual bruising or bleeding. States it is hard to afford her Eliquis at times   Plan  Continue current medications  Referred to [facility] Extra Help Program  ,  COPD / Asthma / Tobacco   Gold Grade: n/a Current COPD Classification:  B (high sx, <2 exacerbations/yr)  Eosinophil count:  No results found for: EOSPCT%                               Eos (Absolute): No results found for: EOSABS  Tobacco Status:  Social History   Tobacco Use  Smoking Status Current Every Day Smoker  . Packs/day: 0.25  . Years: 50.00  . Pack years: 12.50  . Types: Cigarettes  Smokeless Tobacco Never Used  Tobacco Comment   2 cigs a day    Patient has failed these meds in past: Trelegy (unnafordable) Patient is currently controlled on the following medications:   Albuterol HFA 108 mcg/act   Spiriva 2.5 mcg daily ($147/month)    Using maintenance inhaler regularly? Yes Frequency of rescue inhaler use:  1-2x per week  We discussed:  proper inhaler technique. Patient never started NAC or guaifenesin, as she didn't think symptoms were severe enough to need them.  States significant challenges affording Spiriva now that she is in the [facility].   Plan  Continue current medications  Referred to [facility] Extra Help Program  Tobacco Abuse   Tobacco Status:  Social History   Tobacco Use  Smoking Status Current Every Day Smoker  . Packs/day: 0.25  . Years: 50.00  . Pack years: 12.50  . Types: Cigarettes  Smokeless Tobacco Never Used  Tobacco Comment   2 cigs a day    Patient smokes After 30 minutes of waking Patient triggers include: boredom  and finishing a meal On a scale of 1-10, reports MOTIVATION to quit is 5 On a scale of 1-10, reports CONFIDENCE in quitting is 2  Patient has failed these meds in past: none Patient is currently uncontrolled on the following medications: none  We discussed:  Patient interested in quitting smoking, but unwilling to set a quit date at this time. Provided counseling on nicotine replacement options and offered to help support patient in her quit attempt if willing.   Plan  Continue current medications   Diabetes   Recent Relevant Labs: Lab Results  Component Value Date/Time  HGBA1C 6.3 06/22/2019 11:46 AM     Checking BG: Never  Recent FBG Readings: n/a Recent pre-meal BG readings: n/a Recent 2hr PP BG readings:  n/a Recent HS BG readings: n/a Patient has failed these meds in past: n/a Patient is currently controlled on the following medications: none  Last diabetic Foot exam: No results found for: HMDIABEYEEXA  Last diabetic Eye exam: No results found for: HMDIABFOOTEX   We discussed: diet and exercise extensively. Eating high carbohydrate diet.  Morning: Roll with hot chocolate  Evening: Meatloaf with corn and mashed potatoes   Wants to get into the gym, but has not been active recently. Does not have a specific start date in mind.   Plan  Recommend Low-Carb, DASH DIET.   Hypertension   BP today is:  <140/90  Office blood pressures are  BP Readings from Last 3 Encounters:    01/07/20 (!) 132/78  12/28/19 124/82  10/05/19 (!) 104/58   CMP Latest Ref Rng & Units 01/07/2020 01/07/2020 12/28/2019  Glucose 70 - 99 mg/dL - 287(O) 676(H)  BUN 6 - 23 mg/dL - 20(N) 23  Creatinine 0.40 - 1.20 mg/dL - 4.70(J) 6.28(Z)  Sodium 135 - 145 mEq/L - 141 142  Potassium 3.5 - 5.1 mEq/L - 4.6 4.7  Chloride 96 - 112 mEq/L - 97 95(L)  CO2 19 - 32 mEq/L - 35(H) 32(H)  Calcium 8.4 - 10.5 mg/dL - 66.2 94.7  Total Protein 6.0 - 8.3 g/dL - 7.2 7.1  Total Bilirubin 0.2 - 1.2 mg/dL - 0.5 0.3  Alkaline Phos 48 - 121 IU/L 164(H) 133(H) 166(H)  AST 0 - 37 U/L - 15 15  ALT 0 - 35 U/L - 10 11   Patient has failed these meds in the past: n/a Patient is currently controlled on the following medications:   Furosemide 20 mg daily  Patient checks BP at home infrequently  Patient home BP readings are ranging: n/a  We discussed diet and exercise extensively. Denies significant edema.   Plan  Continue current medications   Hyperlipidemia   Lipid Panel     Component Value Date/Time   CHOL 212 (H) 12/28/2019 1434   TRIG 114 12/28/2019 1434   HDL 87 12/28/2019 1434   CHOLHDL 2.4 12/28/2019 1434   LDLCALC 105 (H) 12/28/2019 1434   LDLDIRECT 86 03/06/2019 1142   LABVLDL 20 12/28/2019 1434     The ASCVD Risk score (Goff DC Jr., et al., 2013) failed to calculate for the following reasons:   The 2013 ASCVD risk score is only valid for ages 23 to 48   Patient has failed these meds in past: n/a Patient is currently controlled on the following medications:   Atorvastatin 10 mg daily  We discussed:  diet and exercise extensively  Plan  Continue current medications  Hypothyroidism   TSH  Date Value Ref Range Status  01/07/2020 0.10 (L) 0.35 - 4.50 uIU/mL Final     Patient has failed these meds in past: n/a Patient is currently controlled on the following medications:   Levothyroxine 50 mcg daily  We discussed:  Separates levothyroxine by 30 minutes from breakfast/ other  medications. Patient states she has not noticed a change in symptoms since decreasing dose of levothyroxine.   Plan  Recommend rechecking TSH on 03/09/20   Misc/OTC   Donepezil 10 mg QHS  Imipramine 50-100 mg nightly (has been taking 2 nightly) - Mood improved. Denies dry mouth, dizziness.   Eye drops   Plan  Continue current medications  Vaccines   Reviewed and discussed patient's vaccination history.    Immunization History  Administered Date(s) Administered  . Fluad Quad(high Dose 65+) 03/06/2019  . Influenza Whole 02/18/2018  . PFIZER SARS-COV-2 Vaccination 08/16/2019, 09/15/2019    Plan  Recommended patient receive Shingrix vaccine.  Medication Management   Pt uses [facility] for all medications. Patient's daughter picks up medicines and fills pill box for her. Uses pill box? Yes Pt endorses 100% compliance  Plan  Continue current medication management strategy  Follow up: 6 month phone visit  Garey Ham Clinical Pharmacist [facility] at [facility]  [phone]

## 2020-02-05 ENCOUNTER — Encounter: Payer: Self-pay | Admitting: Pulmonary Disease

## 2020-02-05 ENCOUNTER — Ambulatory Visit: Payer: Medicare Other | Admitting: Pulmonary Disease

## 2020-02-05 ENCOUNTER — Other Ambulatory Visit: Payer: Self-pay

## 2020-02-05 VITALS — BP 126/64 | HR 77 | Temp 98.4°F | Ht 66.25 in | Wt 185.8 lb

## 2020-02-05 DIAGNOSIS — J449 Chronic obstructive pulmonary disease, unspecified: Secondary | ICD-10-CM

## 2020-02-05 DIAGNOSIS — J9611 Chronic respiratory failure with hypoxia: Secondary | ICD-10-CM | POA: Diagnosis not present

## 2020-02-05 NOTE — Patient Instructions (Signed)
Advanced chronic obstructive pulmonary disease Chronic respiratory failure  Continue using oxygen, try and maintain the oxygen saturations above 89%-what ever he takes to keep it there  Graded exercises as tolerated, try and be very consistent  Bronchodilators on a regular basis  I will see you back in 3 months  Call with significant concerns

## 2020-02-05 NOTE — Patient Instructions (Signed)
Visit Information It was great speaking with you today!  Please let me know if you have any questions about our visit. Goals Addressed            This Visit's Progress   . Chronic Care Management       CARE PLAN ENTRY  Current Barriers:  . Chronic Disease Management support, education, and care coordination needs related to Hyperlipidemia, COPD, Chronic Kidney Disease, Hypothyroidism, and Pre-Diabetes   Hyperlipidemia . Pharmacist Clinical Goal(s): o Over the next 90 days, patient will work with PharmD and providers to maintain LDL goal < 100 . Current regimen:  o Atorvastatin 10 mg   Pre-Diabetes . Pharmacist Clinical Goal(s): o Over the next 90 days, patient will work with PharmD and providers to prevent progression to Diabetes . Interventions: o Recommend following a low-carbohydrate diet and incorporating more non-starchy vegetables.  . Patient self care activities - Over the next 90 days, patient will: o Limit intake of Starchy Foods o Work to increase activity to 150 minutes of moderate-intensity exercise weekly  Atrial Fibrillation . Pharmacist Clinical Goal(s) o Over the next 90 days, patient will work with PharmD and providers to prevent symptoms of heart flutter . Current regimen:  o Diltiazem 120 mg  o Eliquis 5 mg   Medication management . Pharmacist Clinical Goal(s): o Over the next 90 days, patient will work with PharmD and providers to maintain optimal medication adherence . Current pharmacy: [facility] . Interventions o Comprehensive medication review performed. o Continue current medication management strategy. If you have any questions about me helping dispense your medications, let me know.  . Patient self care activities - Over the next 90 days, patient will: o Take medications as prescribed o Report any questions or concerns to PharmD and/or provider(s) o Reach out to the [facility] counselors to see if you qualify for the Extra Help Program to help with  medication costs. You can reach them at [phone]       The patient verbalized understanding of instructions provided today and agreed to receive a mailed copy of patient instruction and/or educational materials.  Telephone follow up appointment with pharmacy team member scheduled for: 08/01/20 at 2:00 PM  Garey Ham Clinical Pharmacist [facility] at [facility]  [phone]

## 2020-02-05 NOTE — Progress Notes (Signed)
Charlott Calvario    [id number]    [birth year]  Primary Care Physician:Kremer, Talmadge Coventry, MD  Referring Physician: Mliss Sax, MD [address] [place],  [place] [address]  Chief complaint:   Patient with a history of obstructive lung disease, chronic respiratory failure In for follow-up today  HPI:  Diagnosed with COPD many years ago Relocated from [place]  Has been doing well with oxygen supplementation Regular exercises Continues on inhalers on a regular basis Has not had significant need for rescue inhaler use  Not feeling acutely ill Does have occasional cough Does have occasional wheezes  She did try Trelegy-not affordable for her She is considering using Trelegy  Smokes about 3 to 4 cigarettes a day, over a pack a day in the past History of chronic respiratory failure-has been on oxygen at 2 L She checks her oxygen on a regular basis, off oxygen she runs as low as 83, mostly keeps it in the low 90s with oxygen at 2 L She is able to walk with a walker about 50 yards or bit more  History of atrial fibrillation  History of chronic respiratory failure, was started on BiPAP-has been on BiPAP for 2 to 3 years    Outpatient Encounter Medications as of 02/05/2020  Medication Sig  . albuterol (VENTOLIN HFA) 108 (90 Base) MCG/ACT inhaler INHALE 2 PUFFS INTO THE LUNGS EVERY 6 HOURS AS NEEDED FOR WHEEZING OR SHORTNESS OF BREATH  . atorvastatin (LIPITOR) 20 MG tablet Take 1 tablet (20 mg total) by mouth daily.  . carboxymethylcellulose (REFRESH PLUS) 0.5 % SOLN 1 drop 3 (three) times daily as needed.  . diltiazem (CARDIZEM CD) 120 MG 24 hr capsule Take 1 capsule (120 mg total) by mouth daily.  Marland Kitchen donepezil (ARICEPT) 10 MG tablet TAKE 1 TABLET(10 MG) BY MOUTH AT BEDTIME  . ELIQUIS 5 MG TABS tablet TAKE 1 TABLET(5 MG) BY MOUTH TWICE DAILY  . furosemide (LASIX) 20 MG tablet TAKE 1 TABLET(20 MG) BY MOUTH DAILY  . imipramine (TOFRANIL) 50 MG  tablet May take 1-2 nightly.  . levothyroxine (SYNTHROID) 50 MCG tablet Take 1 tablet (50 mcg total) by mouth daily before breakfast.  . SPIRIVA RESPIMAT 2.5 MCG/ACT AERS INHALE 2 PUFFS INTO THE LUNGS DAILY   No facility-administered encounter medications on file as of 02/05/2020.    Allergies as of 02/05/2020 - Review Complete 02/05/2020  Allergen Reaction Noted  . Codeine  02/19/2018    Past Medical History:  Diagnosis Date  . A-fib (HCC)   . COPD (chronic obstructive pulmonary disease) (HCC)   . Hyperlipidemia   . Hypertension     Past Surgical History:  Procedure Laterality Date  . CAROTID ANGIOGRAM      History reviewed. No pertinent family history.  Social History   Socioeconomic History  . Marital status: Single    Spouse name: Not on file  . Number of children: Not on file  . Years of education: Not on file  . Highest education level: Not on file  Occupational History  . Not on file  Tobacco Use  . Smoking status: Current Every Day Smoker    Packs/day: 0.25    Years: 50.00    Pack years: 12.50    Types: Cigarettes  . Smokeless tobacco: Never Used  . Tobacco comment: 2 cigs a day  Substance and Sexual Activity  . Alcohol use: Not Currently  . Drug use: Never  . Sexual activity: Not  on file  Other Topics Concern  . Not on file  Social History Narrative  . Not on file   Social Determinants of Health   Financial Resource Strain: Low Risk   . Difficulty of Paying Living Expenses: Not hard at all  Food Insecurity: No Food Insecurity  . Worried About Programme researcher, broadcasting/film/video in the Last Year: Never true  . Ran Out of Food in the Last Year: Never true  Transportation Needs: No Transportation Needs  . Lack of Transportation (Medical): No  . Lack of Transportation (Non-Medical): No  Physical Activity:   . Days of Exercise per Week: Not on file  . Minutes of Exercise per Session: Not on file  Stress:   . Feeling of Stress : Not on file  Social Connections:    . Frequency of Communication with Friends and Family: Not on file  . Frequency of Social Gatherings with Friends and Family: Not on file  . Attends Religious Services: Not on file  . Active Member of Clubs or Organizations: Not on file  . Attends Banker Meetings: Not on file  . Marital Status: Not on file  Intimate Partner Violence:   . Fear of Current or Ex-Partner: Not on file  . Emotionally Abused: Not on file  . Physically Abused: Not on file  . Sexually Abused: Not on file    Review of Systems  Constitutional: Negative.   HENT: Negative.   Eyes: Negative.   Respiratory: Positive for shortness of breath and wheezing.   Cardiovascular: Negative.   All other systems reviewed and are negative.   Vitals:   02/05/20 0950  BP: 126/64  Pulse: 77  Temp: 98.4 F (36.9 C)  SpO2: 95%     Physical Exam Constitutional:      Appearance: She is well-developed.  HENT:     Head: Normocephalic and atraumatic.  Eyes:     General:        Right eye: No discharge.        Left eye: No discharge.     Pupils: Pupils are equal, round, and reactive to light.  Neck:     Thyroid: No thyromegaly.     Trachea: No tracheal deviation.  Cardiovascular:     Rate and Rhythm: Normal rate and regular rhythm.  Pulmonary:     Effort: Pulmonary effort is normal. No respiratory distress.     Breath sounds: No wheezing or rales.  Musculoskeletal:     Cervical back: No rigidity.     Data Reviewed: Records from previous primary physician reviewed Had a CT scan done in 2019 showing some granulomatous disease-stable from previous No PFT  BiPAP compliance reveals 100% compliance BiPAP set between 12/5 AHI of 6.2   Assessment:  Chronic respiratory failure -Secondary to obstructive lung disease -Continue oxygen supplementation at 2 L -Continue BiPAP at night -Download from her machine-compliant, has an AHI of 6.2 -Encouraged to continue using BiPAP at night  Chronic  obstructive pulmonary disease -Controlled symptoms on Spiriva and Proventil -We did try to switch to Trelegy-not affordable -Continue Spiriva at present  Active smoker -Working on quitting -Currently smokes about 3 to 4 cigarettes a day -Smoking cessation counseling  Deconditioning -We will start/increase activity as tolerated -Graded activity as tolerated  Atrial fibrillation -Controlled    Plan/Recommendations:  Continue BiPAP Continue Spiriva and albuterol -She may call to have her switch to Trelegy Graded exercise as tolerated Counseling regarding smoking cessation I will see her back in  the office in about 3 months Encouraged to continue using BiPAP on a regular basis   Encouraged to call with any significant concerns  Virl Diamond MD [facility] 02/05/2020, 10:03 AM  CC: Mliss Sax,*

## 2020-02-10 ENCOUNTER — Telehealth: Payer: Self-pay

## 2020-02-10 NOTE — Progress Notes (Addendum)
    Chronic Care Management Pharmacy Assistant   Name: Tiffany Jennings  MRN: [id number] DOB: June 12, 1935  Reason for Encounter: Medication Review/ patient assistance    PCP : Mliss Sax, MD  Allergies:   Allergies  Allergen Reactions   Codeine     Medications: Outpatient Encounter Medications as of 02/10/2020  Medication Sig   albuterol (VENTOLIN HFA) 108 (90 Base) MCG/ACT inhaler INHALE 2 PUFFS INTO THE LUNGS EVERY 6 HOURS AS NEEDED FOR WHEEZING OR SHORTNESS OF BREATH   atorvastatin (LIPITOR) 20 MG tablet Take 1 tablet (20 mg total) by mouth daily.   carboxymethylcellulose (REFRESH PLUS) 0.5 % SOLN 1 drop 3 (three) times daily as needed.   diltiazem (CARDIZEM CD) 120 MG 24 hr capsule Take 1 capsule (120 mg total) by mouth daily.   donepezil (ARICEPT) 10 MG tablet TAKE 1 TABLET(10 MG) BY MOUTH AT BEDTIME   ELIQUIS 5 MG TABS tablet TAKE 1 TABLET(5 MG) BY MOUTH TWICE DAILY   furosemide (LASIX) 20 MG tablet TAKE 1 TABLET(20 MG) BY MOUTH DAILY   imipramine (TOFRANIL) 50 MG tablet May take 1-2 nightly.   levothyroxine (SYNTHROID) 50 MCG tablet Take 1 tablet (50 mcg total) by mouth daily before breakfast.   SPIRIVA RESPIMAT 2.5 MCG/ACT AERS INHALE 2 PUFFS INTO THE LUNGS DAILY   No facility-administered encounter medications on file as of 02/10/2020.    Current Diagnosis: Patient Active Problem List   Diagnosis Date Noted   Gait instability 06/22/2019   Elevated glucose 06/22/2019   Depression, recurrent (HCC) 03/06/2019   Atrial fibrillation (HCC) 03/06/2019   Essential hypertension 03/06/2019   Elevated cholesterol 03/06/2019   Hypothyroidism 03/06/2019    Goals Addressed   None     Follow-Up:  Patient Assistance Coordination   Patient stated she does not qualify for the Extra Help program and would like to apply for patient assistance for her expensive medications.  Spoke to patient to inform her that we are sending her a Patient assistance form  for Eliquis  and spiriva by mail.Informed patient to include a copy of her proof of income AND a copy of her Explanation of Benefits (EOB) statement from her insurance. Advised patient to return the PAP forms back to the [facility].  Patient Verbalized understanding  Everlean Cherry Clinical Pharmacist Assistant [phone]

## 2020-02-16 DIAGNOSIS — J449 Chronic obstructive pulmonary disease, unspecified: Secondary | ICD-10-CM | POA: Diagnosis not present

## 2020-02-24 ENCOUNTER — Telehealth: Payer: Self-pay | Admitting: Family Medicine

## 2020-02-24 NOTE — Telephone Encounter (Signed)
Please advise message below  °

## 2020-02-24 NOTE — Telephone Encounter (Signed)
Patient is calling and wanted to know if Dr. Doreene Burke is recommending the booster shot for the vaccine and how long after from the second vaccine, please advise. CB is [phone]

## 2020-02-25 ENCOUNTER — Other Ambulatory Visit: Payer: Self-pay | Admitting: Family Medicine

## 2020-02-25 NOTE — Telephone Encounter (Signed)
Yes. 6-8 months after the 2nd vaccine.

## 2020-02-25 NOTE — Telephone Encounter (Signed)
Patient aware of message below.

## 2020-03-01 ENCOUNTER — Ambulatory Visit: Payer: Medicare Other | Attending: Internal Medicine

## 2020-03-01 DIAGNOSIS — Z23 Encounter for immunization: Secondary | ICD-10-CM

## 2020-03-01 NOTE — Progress Notes (Signed)
   Covid-19 Vaccination Clinic  Name:  Nikisha Fleece    MRN: [id number] DOB: 1934-08-29  03/01/2020  Tiffany Jennings was observed post Covid-19 immunization for 15 minutes without incident. She was provided with Vaccine Information Sheet and instruction to access the V-Safe system.   Ms. Welling was instructed to call 911 with any severe reactions post vaccine: Marland Kitchen Difficulty breathing  . Swelling of face and throat  . A fast heartbeat  . A bad rash all over body  . Dizziness and weakness

## 2020-03-17 DIAGNOSIS — J449 Chronic obstructive pulmonary disease, unspecified: Secondary | ICD-10-CM | POA: Diagnosis not present

## 2020-03-22 ENCOUNTER — Other Ambulatory Visit: Payer: Self-pay | Admitting: Cardiology

## 2020-03-22 NOTE — Telephone Encounter (Signed)
This is Dr. Schumann's pt 

## 2020-03-23 ENCOUNTER — Other Ambulatory Visit: Payer: Self-pay | Admitting: Family Medicine

## 2020-03-23 DIAGNOSIS — I10 Essential (primary) hypertension: Secondary | ICD-10-CM

## 2020-03-28 ENCOUNTER — Other Ambulatory Visit: Payer: Self-pay

## 2020-03-29 ENCOUNTER — Ambulatory Visit (INDEPENDENT_AMBULATORY_CARE_PROVIDER_SITE_OTHER): Payer: Medicare Other | Admitting: Family Medicine

## 2020-03-29 ENCOUNTER — Encounter: Payer: Self-pay | Admitting: Family Medicine

## 2020-03-29 VITALS — BP 120/68 | HR 53 | Temp 96.6°F | Ht 66.0 in | Wt 175.4 lb

## 2020-03-29 DIAGNOSIS — E039 Hypothyroidism, unspecified: Secondary | ICD-10-CM

## 2020-03-29 DIAGNOSIS — Z23 Encounter for immunization: Secondary | ICD-10-CM

## 2020-03-29 DIAGNOSIS — F339 Major depressive disorder, recurrent, unspecified: Secondary | ICD-10-CM | POA: Diagnosis not present

## 2020-03-29 NOTE — Progress Notes (Addendum)
Established Patient Office Visit  Subjective:  Patient ID: Tiffany Jennings, female    DOB: Nov 26, 1934  Age: 85 y.o. MRN: [id number]  CC:  Chief Complaint  Patient presents with  . Follow-up    3 month follow up no concerns.     HPI Tiffany Jennings presents for follow-up of depression and hypothyroidism.  Has been taking her new dose of levothyroxine 50 mcg every morning on a fasting stomach 30 minutes before eating.  She has been taking imipramine 100 mg daily for years.  It is worked well for her depression.  Chart review shows that her sodium has been stable.  Denies blurred vision and dry mouth.  COPD is stable on 2 L of oxygen.  Continues to smoke 1 or 2 cigarettes daily.  She does suffer from constipation from time to time.  This is responded to [facility].  Past Medical History:  Diagnosis Date  . A-fib (HCC)   . COPD (chronic obstructive pulmonary disease) (HCC)   . Hyperlipidemia   . Hypertension     Past Surgical History:  Procedure Laterality Date  . CAROTID ANGIOGRAM      History reviewed. No pertinent family history.  Social History   Socioeconomic History  . Marital status: Single    Spouse name: Not on file  . Number of children: Not on file  . Years of education: Not on file  . Highest education level: Not on file  Occupational History  . Not on file  Tobacco Use  . Smoking status: Current Every Day Smoker    Packs/day: 0.25    Years: 50.00    Pack years: 12.50    Types: Cigarettes  . Smokeless tobacco: Never Used  . Tobacco comment: 2 cigs a day  Substance and Sexual Activity  . Alcohol use: Not Currently  . Drug use: Never  . Sexual activity: Not on file  Other Topics Concern  . Not on file  Social History Narrative  . Not on file   Social Determinants of Health   Financial Resource Strain: Low Risk   . Difficulty of Paying Living Expenses: Not hard at all  Food Insecurity: No Food Insecurity  . Worried About Programme researcher, broadcasting/film/video in the Last  Year: Never true  . Ran Out of Food in the Last Year: Never true  Transportation Needs: No Transportation Needs  . Lack of Transportation (Medical): No  . Lack of Transportation (Non-Medical): No  Physical Activity:   . Days of Exercise per Week: Not on file  . Minutes of Exercise per Session: Not on file  Stress:   . Feeling of Stress : Not on file  Social Connections:   . Frequency of Communication with Friends and Family: Not on file  . Frequency of Social Gatherings with Friends and Family: Not on file  . Attends Religious Services: Not on file  . Active Member of Clubs or Organizations: Not on file  . Attends Banker Meetings: Not on file  . Marital Status: Not on file  Intimate Partner Violence:   . Fear of Current or Ex-Partner: Not on file  . Emotionally Abused: Not on file  . Physically Abused: Not on file  . Sexually Abused: Not on file    Outpatient Medications Prior to Visit  Medication Sig Dispense Refill  . albuterol (VENTOLIN HFA) 108 (90 Base) MCG/ACT inhaler INHALE 2 PUFFS INTO THE LUNGS EVERY 6 HOURS AS NEEDED FOR WHEEZING OR SHORTNESS OF BREATH 18 g  1  . atorvastatin (LIPITOR) 20 MG tablet Take 1 tablet (20 mg total) by mouth daily. 90 tablet 3  . carboxymethylcellulose (REFRESH PLUS) 0.5 % SOLN 1 drop 3 (three) times daily as needed.    . diltiazem (CARDIZEM CD) 120 MG 24 hr capsule TAKE 1 CAPSULE(120 MG) BY MOUTH DAILY 90 capsule 2  . donepezil (ARICEPT) 10 MG tablet TAKE 1 TABLET(10 MG) BY MOUTH AT BEDTIME 90 tablet 1  . ELIQUIS 5 MG TABS tablet TAKE 1 TABLET(5 MG) BY MOUTH TWICE DAILY 60 tablet 5  . furosemide (LASIX) 20 MG tablet TAKE 1 TABLET(20 MG) BY MOUTH DAILY 30 tablet 2  . imipramine (TOFRANIL) 50 MG tablet May take 1-2 nightly. 60 tablet 5  . SPIRIVA RESPIMAT 2.5 MCG/ACT AERS INHALE 2 PUFFS INTO THE LUNGS DAILY 4 g 5  . levothyroxine (SYNTHROID) 50 MCG tablet Take 1 tablet (50 mcg total) by mouth daily before breakfast. 90 tablet 0  .  TRELEGY ELLIPTA 100-62.5-25 MCG/INH AEPB Inhale 1 puff into the lungs daily.     No facility-administered medications prior to visit.    Allergies  Allergen Reactions  . Codeine     ROS Review of Systems  Constitutional: Negative.   HENT: Negative.   Eyes: Negative for photophobia and visual disturbance.  Respiratory: Positive for shortness of breath and wheezing. Negative for chest tightness.   Cardiovascular: Negative.  Negative for chest pain and palpitations.  Gastrointestinal: Negative.   Endocrine: Negative for polyphagia and polyuria.  Genitourinary: Negative.   Musculoskeletal: Positive for gait problem.  Skin: Negative for pallor and rash.  Allergic/Immunologic: Negative for immunocompromised state.  Neurological: Negative for light-headedness and numbness.  Hematological: Does not bruise/bleed easily.  Psychiatric/Behavioral: Negative for dysphoric mood.      Objective:    Physical Exam  BP 120/68   Pulse (!) 53   Temp (!) 96.6 F (35.9 C) (Tympanic)   Ht 5\' 6"  (1.676 m)   Wt 175 lb 6.4 oz (79.6 kg)   SpO2 96%   BMI 28.31 kg/m  Wt Readings from Last 3 Encounters:  03/29/20 175 lb 6.4 oz (79.6 kg)  02/05/20 185 lb 12.8 oz (84.3 kg)  01/07/20 182 lb (82.6 kg)     Health Maintenance Due  Topic Date Due  . URINE MICROALBUMIN  Never done  . TETANUS/TDAP  Never done  . PNA vac Low Risk Adult (2 of 2 - PPSV23) 06/25/2019    There are no preventive care reminders to display for this patient.  Lab Results  Component Value Date   TSH 1.96 03/29/2020   Lab Results  Component Value Date   WBC 10.7 (H) 01/07/2020   HGB 13.2 01/07/2020   HCT 40.1 01/07/2020   MCV 92.2 01/07/2020   PLT 313.0 01/07/2020   Lab Results  Component Value Date   NA 141 01/07/2020   K 4.6 01/07/2020   CO2 35 (H) 01/07/2020   GLUCOSE 115 (H) 01/07/2020   BUN 24 (H) 01/07/2020   CREATININE 1.50 (H) 01/07/2020   BILITOT 0.5 01/07/2020   ALKPHOS 133 (H) 01/07/2020    ALKPHOS 164 (H) 01/07/2020   AST 15 01/07/2020   ALT 10 01/07/2020   PROT 7.2 01/07/2020   ALBUMIN 4.0 01/07/2020   CALCIUM 10.2 01/07/2020   GFR 33.20 (L) 01/07/2020   Lab Results  Component Value Date   CHOL 212 (H) 12/28/2019   Lab Results  Component Value Date   HDL 87 12/28/2019   Lab Results  Component Value Date   LDLCALC 105 (H) 12/28/2019   Lab Results  Component Value Date   TRIG 114 12/28/2019   Lab Results  Component Value Date   CHOLHDL 2.4 12/28/2019   Lab Results  Component Value Date   HGBA1C 6.3 06/22/2019      Assessment & Plan:   Problem List Items Addressed This Visit      Endocrine   Hypothyroidism - Primary   Relevant Medications   levothyroxine (SYNTHROID) 50 MCG tablet   Other Relevant Orders   TSH (Completed)     Other   Depression, recurrent (HCC)    Other Visit Diagnoses    Need for influenza vaccination       Relevant Orders   Flu Vaccine QUAD High Dose(Fluad) (Completed)      Meds ordered this encounter  Medications  . levothyroxine (SYNTHROID) 50 MCG tablet    Sig: Take 1 tablet (50 mcg total) by mouth daily before breakfast.    Dispense:  90 tablet    Refill:  4    Follow-up: Return in about 4 months (around 07/30/2020).  Patient is taking imipramine for years for her depression.  It has suited her and worked relatively well for her.  Do not see reason to change that.  Asked her to continue to try to quit smoking.  She is aware of the the dangers of smoking and using oxygen. Mliss Sax, MD

## 2020-03-30 LAB — TSH: TSH: 1.96 u[IU]/mL (ref 0.35–4.50)

## 2020-04-01 MED ORDER — LEVOTHYROXINE SODIUM 50 MCG PO TABS: 50.0000 ug | ORAL_TABLET | Freq: Every day | ORAL | 4 refills | Status: DC

## 2020-04-01 NOTE — Addendum Note (Signed)
Addended by: Andrez Grime on: 04/01/2020 08:12 AM   Modules accepted: Orders

## 2020-04-05 ENCOUNTER — Other Ambulatory Visit: Payer: Self-pay

## 2020-04-05 DIAGNOSIS — E039 Hypothyroidism, unspecified: Secondary | ICD-10-CM

## 2020-04-05 MED ORDER — LEVOTHYROXINE SODIUM 50 MCG PO TABS: 50.0000 ug | ORAL_TABLET | Freq: Every day | ORAL | 4 refills | Status: DC

## 2020-04-05 NOTE — Telephone Encounter (Signed)
Pharmacy didn't received the RX, so  resent to the pharmacy due to E-scribing error on 04/01/20.  Dm/cma 

## 2020-04-12 ENCOUNTER — Telehealth: Payer: Self-pay

## 2020-04-12 NOTE — Progress Notes (Signed)
    Chronic Care Management Pharmacy Assistant   Name: Reise Hietala  MRN: [id number] DOB: [birth year]  Reason for Encounter:Hyperlipdema  Disease State Call.  PCP : Mliss Sax, MD  Allergies:   Allergies  Allergen Reactions  . Codeine     Medications: Outpatient Encounter Medications as of 04/12/2020  Medication Sig  . albuterol (VENTOLIN HFA) 108 (90 Base) MCG/ACT inhaler INHALE 2 PUFFS INTO THE LUNGS EVERY 6 HOURS AS NEEDED FOR WHEEZING OR SHORTNESS OF BREATH  . atorvastatin (LIPITOR) 20 MG tablet Take 1 tablet (20 mg total) by mouth daily.  . carboxymethylcellulose (REFRESH PLUS) 0.5 % SOLN 1 drop 3 (three) times daily as needed.  . diltiazem (CARDIZEM CD) 120 MG 24 hr capsule TAKE 1 CAPSULE(120 MG) BY MOUTH DAILY  . donepezil (ARICEPT) 10 MG tablet TAKE 1 TABLET(10 MG) BY MOUTH AT BEDTIME  . ELIQUIS 5 MG TABS tablet TAKE 1 TABLET(5 MG) BY MOUTH TWICE DAILY  . furosemide (LASIX) 20 MG tablet TAKE 1 TABLET(20 MG) BY MOUTH DAILY  . imipramine (TOFRANIL) 50 MG tablet May take 1-2 nightly.  . levothyroxine (SYNTHROID) 50 MCG tablet Take 1 tablet (50 mcg total) by mouth daily before breakfast.  . SPIRIVA RESPIMAT 2.5 MCG/ACT AERS INHALE 2 PUFFS INTO THE LUNGS DAILY  . TRELEGY ELLIPTA 100-62.5-25 MCG/INH AEPB Inhale 1 puff into the lungs daily.   No facility-administered encounter medications on file as of 04/12/2020.    Current Diagnosis: Patient Active Problem List   Diagnosis Date Noted  . Gait instability 06/22/2019  . Elevated glucose 06/22/2019  . Depression, recurrent (HCC) 03/06/2019  . Atrial fibrillation (HCC) 03/06/2019  . Essential hypertension 03/06/2019  . Elevated cholesterol 03/06/2019  . Hypothyroidism 03/06/2019    Follow-Up:  Pharmacist Review   04/12/2020 Name: Brynnly Bonet MRN: [id number] DOB: 03/19/1935 Teri Legacy is a 85 y.o. year old female who is a primary care patient of Mliss Sax, MD.  Comprehensive medication  review performed; Spoke to patient regarding cholesterol  Lipid Panel    Component Value Date/Time   CHOL 212 (H) 12/28/2019 1434   TRIG 114 12/28/2019 1434   HDL 87 12/28/2019 1434   LDLCALC 105 (H) 12/28/2019 1434   LDLDIRECT 86 03/06/2019 1142    10-year ASCVD risk score: The ASCVD Risk score Denman George DC Jr., et al., 2013) failed to calculate for the following reasons:   The 2013 ASCVD risk score is only valid for ages 14 to 57  . Current antihyperlipidemic regimen:  ? Atorvastatin 10 mg  . Previous antihyperlipidemic medications tried: None ID . ASCVD risk enhancing conditions: age >42, DM, HTN, CKD, CHF, current smoker  Chronic Kidney Disease  Pre-Diabetes . What recent interventions/DTPs have been made by any provider to improve Cholesterol control since last CPP Visit:  None . Any recent hospitalizations or ED visits since last visit with CPP? No . What diet changes have been made to improve Cholesterol?  o Patient states she cooks at home rarely using salt. . What exercise is being done to improve Cholesterol?  o Patient reports walking 35-40 minutes daily.  Adherence Review: Does the patient have >5 day gap between last estimated fill dates? Yes   Valora Piccolo Clinical Pharmacist Assistant [phone]

## 2020-04-17 DIAGNOSIS — J449 Chronic obstructive pulmonary disease, unspecified: Secondary | ICD-10-CM | POA: Diagnosis not present

## 2020-05-17 DIAGNOSIS — J449 Chronic obstructive pulmonary disease, unspecified: Secondary | ICD-10-CM | POA: Diagnosis not present

## 2020-05-18 ENCOUNTER — Other Ambulatory Visit: Payer: Self-pay | Admitting: Family Medicine

## 2020-05-27 ENCOUNTER — Telehealth: Payer: Self-pay

## 2020-05-27 NOTE — Progress Notes (Signed)
    Chronic Care Management Pharmacy Assistant   Name: Tiffany Jennings  MRN: [id number] DOB: 1935/01/03  Reason for Encounter: Medication Review/General Adherence   PCP : Mliss Sax, MD  Allergies:   Allergies  Allergen Reactions  . Codeine     Medications: Outpatient Encounter Medications as of 05/27/2020  Medication Sig  . albuterol (VENTOLIN HFA) 108 (90 Base) MCG/ACT inhaler INHALE 2 PUFFS INTO THE LUNGS EVERY 6 HOURS AS NEEDED FOR WHEEZING OR SHORTNESS OF BREATH  . atorvastatin (LIPITOR) 20 MG tablet Take 1 tablet (20 mg total) by mouth daily.  . carboxymethylcellulose (REFRESH PLUS) 0.5 % SOLN 1 drop 3 (three) times daily as needed.  . diltiazem (CARDIZEM CD) 120 MG 24 hr capsule TAKE 1 CAPSULE(120 MG) BY MOUTH DAILY  . donepezil (ARICEPT) 10 MG tablet TAKE 1 TABLET(10 MG) BY MOUTH AT BEDTIME  . ELIQUIS 5 MG TABS tablet TAKE 1 TABLET(5 MG) BY MOUTH TWICE DAILY  . furosemide (LASIX) 20 MG tablet TAKE 1 TABLET(20 MG) BY MOUTH DAILY  . imipramine (TOFRANIL) 50 MG tablet May take 1-2 nightly.  . levothyroxine (SYNTHROID) 50 MCG tablet Take 1 tablet (50 mcg total) by mouth daily before breakfast.  . SPIRIVA RESPIMAT 2.5 MCG/ACT AERS INHALE 2 PUFFS INTO THE LUNGS DAILY  . TRELEGY ELLIPTA 100-62.5-25 MCG/INH AEPB Inhale 1 puff into the lungs daily.   No facility-administered encounter medications on file as of 05/27/2020.    Current Diagnosis: Patient Active Problem List   Diagnosis Date Noted  . Gait instability 06/22/2019  . Elevated glucose 06/22/2019  . Depression, recurrent (HCC) 03/06/2019  . Atrial fibrillation (HCC) 03/06/2019  . Essential hypertension 03/06/2019  . Elevated cholesterol 03/06/2019  . Hypothyroidism 03/06/2019   Follow-Up:  Pharmacist Review   Called patient and discussed medication adherence  with patient, no issues at this time with current medication.   Patient denies ED visit since her last CPP follow up.  Patient denies any side  effects with her medication. Patient denies any problems with her current pharmacy  Everlean Cherry Clinical Pharmacist Assistant [phone]

## 2020-06-06 ENCOUNTER — Telehealth: Payer: Self-pay

## 2020-06-06 NOTE — Progress Notes (Signed)
    Chronic Care Management Pharmacy Assistant   Name: Tiffany Jennings  MRN: [id number] DOB: [birth year]  Reason for Encounter: Medication Review/patient assistance for Eliquis and Spiriva Respimat.  PCP : Mliss Sax, MD  Allergies:   Allergies  Allergen Reactions  . Codeine     Medications: Outpatient Encounter Medications as of 06/06/2020  Medication Sig  . albuterol (VENTOLIN HFA) 108 (90 Base) MCG/ACT inhaler INHALE 2 PUFFS INTO THE LUNGS EVERY 6 HOURS AS NEEDED FOR WHEEZING OR SHORTNESS OF BREATH  . atorvastatin (LIPITOR) 20 MG tablet Take 1 tablet (20 mg total) by mouth daily.  . carboxymethylcellulose (REFRESH PLUS) 0.5 % SOLN 1 drop 3 (three) times daily as needed.  . diltiazem (CARDIZEM CD) 120 MG 24 hr capsule TAKE 1 CAPSULE(120 MG) BY MOUTH DAILY  . donepezil (ARICEPT) 10 MG tablet TAKE 1 TABLET(10 MG) BY MOUTH AT BEDTIME  . ELIQUIS 5 MG TABS tablet TAKE 1 TABLET(5 MG) BY MOUTH TWICE DAILY  . furosemide (LASIX) 20 MG tablet TAKE 1 TABLET(20 MG) BY MOUTH DAILY  . imipramine (TOFRANIL) 50 MG tablet May take 1-2 nightly.  . levothyroxine (SYNTHROID) 50 MCG tablet Take 1 tablet (50 mcg total) by mouth daily before breakfast.  . SPIRIVA RESPIMAT 2.5 MCG/ACT AERS INHALE 2 PUFFS INTO THE LUNGS DAILY  . TRELEGY ELLIPTA 100-62.5-25 MCG/INH AEPB Inhale 1 puff into the lungs daily.   No facility-administered encounter medications on file as of 06/06/2020.    Current Diagnosis: Patient Active Problem List   Diagnosis Date Noted  . Gait instability 06/22/2019  . Elevated glucose 06/22/2019  . Depression, recurrent (HCC) 03/06/2019  . Atrial fibrillation (HCC) 03/06/2019  . Essential hypertension 03/06/2019  . Elevated cholesterol 03/06/2019  . Hypothyroidism 03/06/2019    Goals Addressed   None     Follow-Up:  Patient Assistance Coordination   Company  [employer] has not receive application.  Everlean Cherry Clinical Pharmacist Assistant [phone]

## 2020-06-17 DIAGNOSIS — J449 Chronic obstructive pulmonary disease, unspecified: Secondary | ICD-10-CM | POA: Diagnosis not present

## 2020-06-18 ENCOUNTER — Other Ambulatory Visit: Payer: Self-pay | Admitting: Family Medicine

## 2020-06-18 DIAGNOSIS — I10 Essential (primary) hypertension: Secondary | ICD-10-CM

## 2020-06-26 NOTE — Progress Notes (Signed)
Cardiology Office Note:    Date:  06/27/2020   ID:  Tiffany Jennings, DOB Aug 20, 1934, MRN [id number]  PCP:  Tiffany Sax, MD  Cardiologist:  No primary care provider on file.  Electrophysiologist:  None   Referring MD: Tiffany Jennings,*   Chief Complaint  Patient presents with  . Atrial Fibrillation   History of Present Illness:    Tiffany Jennings is a 86 y.o. female with a hx of atrial fibrillation on Eliquis, COPD on oxygen, hypertension, hypothyroidism, hyperlipidemia, DVT/PE who presents for follow-up.  She was initially seen on 03/30/2019 for evaluation of atrial fibrillation.  She had moved to the area from [facility].  Was in hospital for pneumonia several years ago and told she had AF.   She denies any symptoms from atrial fibrillation, and does not think she has had any AF since her hospitalization.  She has been on amiodarone 100 mg daily.  The notes from her PCP in [place] indicate that amiodarone was to be discontinued due to her chronic respiratory disease.  However she continued to take amiodarone.  She underwent a TTE in July 2020, which showed normal EF, mild concentric hypertrophy, moderate diastolic dysfunction, no significant valvular disease reported.  She has been on Eliquis and diltiazem for her AF.  Amiodarone was discontinued at initial clinic visit in 03/30/2019.  Zio patch x14 days on 07/29/2019 showed very frequent PACs (18.1% of beats) with numerous episodes of SVT, longest lasting 7 beats.  No evidence of atrial fibrillation.  Echocardiogram on 08/12/2019 showed normal biventricular function, no significant valvular disease.  Since her last clinic visit, she reports that she has been doing well.  Denies any chest pain.  Reports her shortness of breath has been stable.  She walks 1 mile per day.  Denies any lightheadedness, syncope, lower extremity edema.  Denies any palpitations.  Has been taking Lasix 3 days/week.  Smoking 2 to 3 cigarettes/day.  Taking  Eliquis, denies any bleeding issues.   Wt Readings from Last 3 Encounters:  06/27/20 177 lb (80.3 kg)  03/29/20 175 lb 6.4 oz (79.6 kg)  02/05/20 185 lb 12.8 oz (84.3 kg)     Past Medical History:  Diagnosis Date  . A-fib (HCC)   . COPD (chronic obstructive pulmonary disease) (HCC)   . Hyperlipidemia   . Hypertension     Past Surgical History:  Procedure Laterality Date  . CAROTID ANGIOGRAM      Current Medications: Current Meds  Medication Sig  . albuterol (VENTOLIN HFA) 108 (90 Base) MCG/ACT inhaler INHALE 2 PUFFS INTO THE LUNGS EVERY 6 HOURS AS NEEDED FOR WHEEZING OR SHORTNESS OF BREATH  . atorvastatin (LIPITOR) 20 MG tablet Take 1 tablet (20 mg total) by mouth daily.  . carboxymethylcellulose (REFRESH PLUS) 0.5 % SOLN 1 drop 3 (three) times daily as needed.  . diltiazem (CARDIZEM CD) 120 MG 24 hr capsule TAKE 1 CAPSULE(120 MG) BY MOUTH DAILY  . donepezil (ARICEPT) 10 MG tablet TAKE 1 TABLET(10 MG) BY MOUTH AT BEDTIME  . ELIQUIS 5 MG TABS tablet TAKE 1 TABLET(5 MG) BY MOUTH TWICE DAILY  . furosemide (LASIX) 20 MG tablet TAKE 1 TABLET(20 MG) BY MOUTH DAILY  . imipramine (TOFRANIL) 50 MG tablet May take 1-2 nightly.  . levothyroxine (SYNTHROID) 50 MCG tablet Take 1 tablet (50 mcg total) by mouth daily before breakfast.  . SPIRIVA RESPIMAT 2.5 MCG/ACT AERS INHALE 2 PUFFS INTO THE LUNGS DAILY  . TRELEGY ELLIPTA 100-62.5-25 MCG/INH AEPB Inhale 1  puff into the lungs daily.     Allergies:   Codeine   Social History   Socioeconomic History  . Marital status: Single    Spouse name: Not on file  . Number of children: Not on file  . Years of education: Not on file  . Highest education level: Not on file  Occupational History  . Not on file  Tobacco Use  . Smoking status: Current Every Day Smoker    Packs/day: 0.25    Years: 50.00    Pack years: 12.50    Types: Cigarettes  . Smokeless tobacco: Never Used  . Tobacco comment: 2 cigs a day  Substance and Sexual Activity   . Alcohol use: Not Currently  . Drug use: Never  . Sexual activity: Not on file  Other Topics Concern  . Not on file  Social History Narrative  . Not on file   Social Determinants of Health   Financial Resource Strain: Low Risk   . Difficulty of Paying Living Expenses: Not hard at all  Food Insecurity: No Food Insecurity  . Worried About Programme researcher, broadcasting/film/video in the Last Year: Never true  . Ran Out of Food in the Last Year: Never true  Transportation Needs: No Transportation Needs  . Lack of Transportation (Medical): No  . Lack of Transportation (Non-Medical): No  Physical Activity: Not on file  Stress: Not on file  Social Connections: Not on file     Family History: No relevant family history of heart diseaseNo relevant family history of heart disease  ROS:   Please see the history of present illness.     All other systems reviewed and are negative.  EKGs/Labs/Other Studies Reviewed:    The following studies were reviewed today:   EKG:  EKG is ordered today.  The ekg ordered today demonstrates normal sinus rhythm, PACs, rate 76  Recent Labs: 12/28/2019: Magnesium 2.0 01/07/2020: ALT 10; BUN 24; Creatinine, Ser 1.50; Hemoglobin 13.2; Platelets 313.0; Potassium 4.6; Sodium 141 03/29/2020: TSH 1.96  Recent Lipid Panel    Component Value Date/Time   CHOL 212 (H) 12/28/2019 1434   TRIG 114 12/28/2019 1434   HDL 87 12/28/2019 1434   CHOLHDL 2.4 12/28/2019 1434   LDLCALC 105 (H) 12/28/2019 1434   LDLDIRECT 86 03/06/2019 1142    Physical Exam:    VS:  BP 112/60   Pulse 76   Ht 5' 6.75" (1.695 m)   Wt 177 lb (80.3 kg)   BMI 27.93 kg/m     Wt Readings from Last 3 Encounters:  06/27/20 177 lb (80.3 kg)  03/29/20 175 lb 6.4 oz (79.6 kg)  02/05/20 185 lb 12.8 oz (84.3 kg)     GEN:  in no acute distress, on oxygen HEENT: Normal NECK: No JVD LYMPHATICS: No lymphadenopathy CARDIAC: Irregular, normal rate, no murmurs, rubs, gallops RESPIRATORY: Bilateral  expiratory wheezing ABDOMEN: Soft, non-tender, non-distended MUSCULOSKELETAL:  No edema; No deformity  SKIN: Warm and dry NEUROLOGIC:  Alert and oriented x 3 PSYCHIATRIC:  Normal affect   ASSESSMENT:    1. Paroxysmal atrial fibrillation (HCC)   2. Essential hypertension   3. Hyperlipidemia, unspecified hyperlipidemia type   4. Hypothyroidism, unspecified type   5. PAC (premature atrial contraction)   6. SVT (supraventricular tachycardia) (HCC)   7. Tobacco use    PLAN:     Atrial fibrillation: On diltiazem 120 mg and Eliquis 5 mg twice daily.  CHADS-VASc 4 (HTN, agex2, female).  TTE from 08/12/19 shows  normal biventricular function, no significant valvular disease.  Amiodarone discontinued 03/2019 due to risk of pulmonary toxicity given her COPD.  Zio patch x14 days on 07/29/2019 showed very frequent PACs (18.1% of beats) with numerous episodes of SVT, longest lasting 7 beats.  No evidence of atrial fibrillation. -Continue diltiazem for rate control -Continue Eliquis for anticoagulation  Frequent PACs/SVT: Zio patch x14 days on 07/29/2019 showed very frequent PACs (18.1% of beats) with numerous episodes of SVT, longest lasting 7 beats.  Continue diltiazem.  TSH was low, hypothyroidism may have been contributing to increased ectopy, her levothyroxine dose was decreased.  We will recheck TSH  Chronic diastolic heart failure: On Lasix 20 mg 3 times weekly.  Appears euvolemic will check BMP, magnesium.  Hypertension: On diltiazem 120 mg.  Appears controlled  Hyperlipidemia: On atorvastatin 10 mg.  LDL 105 on 12/28/2019, atorvastatin increased to 20 mg daily.  We will recheck lipid panel  Tobacco use: smoked x 30 years for 1 ppd.  Currently is down to 2-3 cigarettes per day.  Risks of tobacco use were discussed and cessation strongly encouraged  Leg pain: ABIs normal, no evidence of PAD  COPD: on home oxygen.  Follows with pulmonology.  RTC in 6 months  Medication Adjustments/Labs and  Tests Ordered: Current medicines are reviewed at length with the patient today.  Concerns regarding medicines are outlined above.  Orders Placed This Encounter  Procedures  . Basic metabolic panel  . Magnesium  . TSH  . Lipid panel  . EKG 12-Lead   No orders of the defined types were placed in this encounter.   Patient Instructions  Medication Instructions:  Your physician recommends that you continue on your current medications as directed. Please refer to the Current Medication list given to you today.  *If you need a refill on your cardiac medications before your next appointment, please call your pharmacy*   Lab Work: BMET, Mag, TSH, Lipid today  If you have labs (blood work) drawn today and your tests are completely normal, you will receive your results only by: Marland Kitchen MyChart Message (if you have MyChart) OR . A paper copy in the mail If you have any lab test that is abnormal or we need to change your treatment, we will call you to review the results.  Follow-Up: At [facility], you and your health needs are our priority.  As part of our continuing mission to provide you with exceptional heart care, we have created designated Provider Care Teams.  These Care Teams include your primary Cardiologist (physician) and Advanced Practice Providers (APPs -  Physician Assistants and Nurse Practitioners) who all work together to provide you with the care you need, when you need it.  We recommend signing up for the patient portal called "MyChart".  Sign up information is provided on this After Visit Summary.  MyChart is used to connect with patients for Virtual Visits (Telemedicine).  Patients are able to view lab/test results, encounter notes, upcoming appointments, etc.  Non-urgent messages can be sent to your provider as well.   To learn more about what you can do with MyChart, go to [email or web address].    Your next appointment:   6 month(s)  The format for your next  appointment:   In Person  Provider:   Epifanio Lesches, MD         Signed, Little Ishikawa, MD  06/27/2020 1:49 PM    [facility]

## 2020-06-27 ENCOUNTER — Ambulatory Visit: Payer: Medicare Other | Admitting: Cardiology

## 2020-06-27 ENCOUNTER — Encounter: Payer: Self-pay | Admitting: Cardiology

## 2020-06-27 ENCOUNTER — Other Ambulatory Visit: Payer: Self-pay

## 2020-06-27 VITALS — BP 112/60 | HR 76 | Ht 66.75 in | Wt 177.0 lb

## 2020-06-27 DIAGNOSIS — E039 Hypothyroidism, unspecified: Secondary | ICD-10-CM | POA: Diagnosis not present

## 2020-06-27 DIAGNOSIS — I471 Supraventricular tachycardia, unspecified: Secondary | ICD-10-CM

## 2020-06-27 DIAGNOSIS — Z720 Tobacco use: Secondary | ICD-10-CM | POA: Diagnosis not present

## 2020-06-27 DIAGNOSIS — I10 Essential (primary) hypertension: Secondary | ICD-10-CM

## 2020-06-27 DIAGNOSIS — I480 Paroxysmal atrial fibrillation: Secondary | ICD-10-CM | POA: Diagnosis not present

## 2020-06-27 DIAGNOSIS — I491 Atrial premature depolarization: Secondary | ICD-10-CM

## 2020-06-27 DIAGNOSIS — E785 Hyperlipidemia, unspecified: Secondary | ICD-10-CM | POA: Diagnosis not present

## 2020-06-27 DIAGNOSIS — I4891 Unspecified atrial fibrillation: Secondary | ICD-10-CM | POA: Diagnosis not present

## 2020-06-27 LAB — LIPID PANEL
Chol/HDL Ratio: 2.2 ratio (ref 0.0–4.4)
Cholesterol, Total: 177 mg/dL (ref 100–199)
HDL: 82 mg/dL (ref >39)
LDL Chol Calc (NIH): 82 mg/dL (ref 0–99)
Triglycerides: 70 mg/dL (ref 0–149)
VLDL Cholesterol Cal: 13 mg/dL (ref 5–40)

## 2020-06-27 LAB — BASIC METABOLIC PANEL
BUN/Creatinine Ratio: 15 (ref 12–28)
BUN: 17 mg/dL (ref 8–27)
CO2: 24 mmol/L (ref 20–29)
Calcium: 9.5 mg/dL (ref 8.7–10.3)
Chloride: 104 mmol/L (ref 96–106)
Creatinine, Ser: 1.12 mg/dL — ABNORMAL HIGH (ref 0.57–1.00)
GFR calc Af Amer: 52 mL/min/{1.73_m2} — ABNORMAL LOW (ref >59)
GFR calc non Af Amer: 46 mL/min/{1.73_m2} — ABNORMAL LOW (ref >59)
Glucose: 99 mg/dL (ref 65–99)
Potassium: 5 mmol/L (ref 3.5–5.2)
Sodium: 140 mmol/L (ref 134–144)

## 2020-06-27 LAB — MAGNESIUM: Magnesium: 2.2 mg/dL (ref 1.6–2.3)

## 2020-06-27 LAB — TSH: TSH: 2.89 u[IU]/mL (ref 0.450–4.500)

## 2020-06-27 NOTE — Patient Instructions (Signed)
Medication Instructions:  Your physician recommends that you continue on your current medications as directed. Please refer to the Current Medication list given to you today.  *If you need a refill on your cardiac medications before your next appointment, please call your pharmacy*   Lab Work: BMET, Mag, TSH, Lipid today  If you have labs (blood work) drawn today and your tests are completely normal, you will receive your results only by: Marland Kitchen MyChart Message (if you have MyChart) OR . A paper copy in the mail If you have any lab test that is abnormal or we need to change your treatment, we will call you to review the results.  Follow-Up: At [facility], you and your health needs are our priority.  As part of our continuing mission to provide you with exceptional heart care, we have created designated Provider Care Teams.  These Care Teams include your primary Cardiologist (physician) and Advanced Practice Providers (APPs -  Physician Assistants and Nurse Practitioners) who all work together to provide you with the care you need, when you need it.  We recommend signing up for the patient portal called "MyChart".  Sign up information is provided on this After Visit Summary.  MyChart is used to connect with patients for Virtual Visits (Telemedicine).  Patients are able to view lab/test results, encounter notes, upcoming appointments, etc.  Non-urgent messages can be sent to your provider as well.   To learn more about what you can do with MyChart, go to [email or web address].    Your next appointment:   6 month(s)  The format for your next appointment:   In Person  Provider:   Epifanio Lesches, MD

## 2020-07-06 ENCOUNTER — Encounter: Payer: Self-pay | Admitting: *Deleted

## 2020-07-16 ENCOUNTER — Other Ambulatory Visit: Payer: Self-pay | Admitting: Family Medicine

## 2020-07-16 DIAGNOSIS — F339 Major depressive disorder, recurrent, unspecified: Secondary | ICD-10-CM

## 2020-07-18 DIAGNOSIS — J449 Chronic obstructive pulmonary disease, unspecified: Secondary | ICD-10-CM | POA: Diagnosis not present

## 2020-08-01 ENCOUNTER — Telehealth: Payer: Medicare Other

## 2020-08-15 DIAGNOSIS — J449 Chronic obstructive pulmonary disease, unspecified: Secondary | ICD-10-CM | POA: Diagnosis not present

## 2020-08-16 ENCOUNTER — Ambulatory Visit: Payer: Medicare Other | Admitting: Cardiology

## 2020-08-16 ENCOUNTER — Other Ambulatory Visit: Payer: Self-pay

## 2020-08-16 DIAGNOSIS — Z720 Tobacco use: Secondary | ICD-10-CM

## 2020-08-16 DIAGNOSIS — I10 Essential (primary) hypertension: Secondary | ICD-10-CM | POA: Diagnosis not present

## 2020-08-16 DIAGNOSIS — Z7901 Long term (current) use of anticoagulants: Secondary | ICD-10-CM | POA: Diagnosis not present

## 2020-08-16 DIAGNOSIS — I951 Orthostatic hypotension: Secondary | ICD-10-CM

## 2020-08-16 DIAGNOSIS — I480 Paroxysmal atrial fibrillation: Secondary | ICD-10-CM

## 2020-08-16 DIAGNOSIS — E785 Hyperlipidemia, unspecified: Secondary | ICD-10-CM

## 2020-08-16 DIAGNOSIS — I471 Supraventricular tachycardia: Secondary | ICD-10-CM

## 2020-08-16 DIAGNOSIS — I5032 Chronic diastolic (congestive) heart failure: Secondary | ICD-10-CM

## 2020-08-16 MED ORDER — FUROSEMIDE 20 MG PO TABS: ORAL_TABLET | ORAL | 2 refills | Status: DC

## 2020-08-16 MED ORDER — ADULT BLOOD PRESSURE CUFF LG KIT: 1.0000 | PACK | Freq: Once | 0 refills | Status: AC

## 2020-08-16 NOTE — Progress Notes (Signed)
Cardiology Office Note:    Date:  08/16/2020   ID:  Tiffany Jennings, DOB [birth year], MRN [id number]  PCP:  Libby Maw, MD  Cardiologist:  No primary care provider on file.  Electrophysiologist:  None   Referring MD: Libby Maw,*   Chief Complaint  Patient presents with  . Dizziness   History of Present Illness:    Tiffany Jennings is a 86 y.o. female with a hx of atrial fibrillation on Eliquis, COPD on oxygen, hypertension, hypothyroidism, hyperlipidemia, DVT/PE who presents for follow-up.  She was initially seen on 03/30/2019 for evaluation of atrial fibrillation.  She had moved to the area from [facility].  Was in hospital for pneumonia several years ago and told she had AF.   She denies any symptoms from atrial fibrillation, and does not think she has had any AF since her hospitalization.  She has been on amiodarone 100 mg daily.  The notes from her PCP in [place] indicate that amiodarone was to be discontinued due to her chronic respiratory disease.  However she continued to take amiodarone.  She underwent a TTE in July 2020, which showed normal EF, mild concentric hypertrophy, moderate diastolic dysfunction, no significant valvular disease reported.  She has been on Eliquis and diltiazem for her AF.  Amiodarone was discontinued at initial clinic visit in 03/30/2019.  Zio patch x14 days on 07/29/2019 showed very frequent PACs (18.1% of beats) with numerous episodes of SVT, longest lasting 7 beats.  No evidence of atrial fibrillation.  Echocardiogram on 08/12/2019 showed normal biventricular function, no significant valvular disease.  Since her last clinic visit, she reports that she has been having lightheadedness when she stands.  Denies any syncope.  Has been taking Lasix 20 mg daily 3 times a week.  She denies any chest pain, dyspnea, palpitations, or lower extremity edema.   Wt Readings from Last 3 Encounters:  06/27/20 177 lb (80.3 kg)  03/29/20 175 lb 6.4 oz  (79.6 kg)  02/05/20 185 lb 12.8 oz (84.3 kg)     Past Medical History:  Diagnosis Date  . A-fib ([place])   . COPD (chronic obstructive pulmonary disease) ([place])   . Hyperlipidemia   . Hypertension     Past Surgical History:  Procedure Laterality Date  . CAROTID ANGIOGRAM      Current Medications: Current Meds  Medication Sig  . albuterol (VENTOLIN HFA) 108 (90 Base) MCG/ACT inhaler INHALE 2 PUFFS INTO THE LUNGS EVERY 6 HOURS AS NEEDED FOR WHEEZING OR SHORTNESS OF BREATH  . atorvastatin (LIPITOR) 20 MG tablet Take 1 tablet (20 mg total) by mouth daily.  . Blood Pressure Monitoring (ADULT BLOOD PRESSURE CUFF LG) KIT 1 kit by Does not apply route once for 1 dose.  . carboxymethylcellulose (REFRESH PLUS) 0.5 % SOLN 1 drop 3 (three) times daily as needed.  . donepezil (ARICEPT) 10 MG tablet TAKE 1 TABLET(10 MG) BY MOUTH AT BEDTIME  . ELIQUIS 5 MG TABS tablet TAKE 1 TABLET(5 MG) BY MOUTH TWICE DAILY  . imipramine (TOFRANIL) 50 MG tablet TAKE 1 TO 2 TABLETS BY MOUTH NIGHTLY  . levothyroxine (SYNTHROID) 50 MCG tablet Take 1 tablet (50 mcg total) by mouth daily before breakfast.  . TRELEGY ELLIPTA 100-62.5-25 MCG/INH AEPB Inhale 1 puff into the lungs daily.  . [DISCONTINUED] diltiazem (CARDIZEM CD) 120 MG 24 hr capsule TAKE 1 CAPSULE(120 MG) BY MOUTH DAILY  . [DISCONTINUED] furosemide (LASIX) 20 MG tablet TAKE 1 TABLET(20 MG) BY MOUTH DAILY  Allergies:   Codeine   Social History   Socioeconomic History  . Marital status: Single    Spouse name: Not on file  . Number of children: Not on file  . Years of education: Not on file  . Highest education level: Not on file  Occupational History  . Not on file  Tobacco Use  . Smoking status: Current Every Day Smoker    Packs/day: 0.25    Years: 50.00    Pack years: 12.50    Types: Cigarettes  . Smokeless tobacco: Never Used  . Tobacco comment: 2 cigs a day  Substance and Sexual Activity  . Alcohol use: Not Currently  . Drug use:  Never  . Sexual activity: Not on file  Other Topics Concern  . Not on file  Social History Narrative  . Not on file   Social Determinants of Health   Financial Resource Strain: Low Risk   . Difficulty of Paying Living Expenses: Not hard at all  Food Insecurity: No Food Insecurity  . Worried About Charity fundraiser in the Last Year: Never true  . Ran Out of Food in the Last Year: Never true  Transportation Needs: No Transportation Needs  . Lack of Transportation (Medical): No  . Lack of Transportation (Non-Medical): No  Physical Activity: Not on file  Stress: Not on file  Social Connections: Not on file     Family History: No relevant family history of heart diseaseNo relevant family history of heart disease  ROS:   Please see the history of present illness.     All other systems reviewed and are negative.  EKGs/Labs/Other Studies Reviewed:    The following studies were reviewed today:   EKG:  EKG is not ordered today.  The ekg ordered at prior clinic visit demonstrates normal sinus rhythm, PACs, rate 76  Recent Labs: 01/07/2020: ALT 10; Hemoglobin 13.2; Platelets 313.0 06/27/2020: BUN 17; Creatinine, Ser 1.12; Magnesium 2.2; Potassium 5.0; Sodium 140; TSH 2.890  Recent Lipid Panel    Component Value Date/Time   CHOL 177 06/27/2020 1145   TRIG 70 06/27/2020 1145   HDL 82 06/27/2020 1145   CHOLHDL 2.2 06/27/2020 1145   LDLCALC 82 06/27/2020 1145   LDLDIRECT 86 03/06/2019 1142    Physical Exam:    VS:  There were no vitals taken for this visit.    Wt Readings from Last 3 Encounters:  06/27/20 177 lb (80.3 kg)  03/29/20 175 lb 6.4 oz (79.6 kg)  02/05/20 185 lb 12.8 oz (84.3 kg)    Orthostatics: Lying 133/68 88 Sitting 121/72 85 Standing 87/46 87 Standing at 3 minutes 108/55 89  GEN:  in no acute distress, on oxygen HEENT: Normal NECK: No JVD CARDIAC: Irregular, normal rate, no murmurs, rubs, gallops RESPIRATORY: Bilateral expiratory wheezing ABDOMEN:  Soft, non-tender, non-distended MUSCULOSKELETAL:  No edema; No deformity  SKIN: Warm and dry NEUROLOGIC:  Alert and oriented x 3 PSYCHIATRIC:  Normal affect   ASSESSMENT:    1. Orthostatic hypotension   2. Paroxysmal atrial fibrillation (HCC)   3. Essential hypertension   4. Current use of long term anticoagulation   5. SVT (supraventricular tachycardia) ([place])   6. Chronic diastolic heart failure ([place])   7. Hyperlipidemia, unspecified hyperlipidemia type   8. Tobacco use    PLAN:    Orthostatic hypotension: BP dropped from 133/68 lying to 87/46 standing.  She has been taking Lasix 20 mg 3 times weekly, will discontinue.  We will hold diltiazem.  Check CMP, CBC.    Atrial fibrillation: On diltiazem 120 mg and Eliquis 5 mg twice daily.  CHADS-VASc 4 (HTN, agex2, female).  TTE from 08/12/19 shows normal biventricular function, no significant valvular disease.  Amiodarone discontinued 03/2019 due to risk of pulmonary toxicity given her COPD.  Zio patch x14 days on 07/29/2019 showed very frequent PACs (18.1% of beats) with numerous episodes of SVT, longest lasting 7 beats.  No evidence of atrial fibrillation. -Hold diltiazem as above -Continue Eliquis for anticoagulation  Frequent PACs/SVT: Zio patch x14 days on 07/29/2019 showed very frequent PACs (18.1% of beats) with numerous episodes of SVT, longest lasting 7 beats.  Continue diltiazem.  TSH was low, hypothyroidism may have been contributing to increased ectopy, her levothyroxine dose was decreased.  TSH normal on 06/27/2020.  Chronic diastolic heart failure: On Lasix 20 mg 3 times weekly.  Holding Lasix as above  Hypertension: Presented with orthostatic hypotension, holding diltiazem as above  Hyperlipidemia: On atorvastatin 10 mg.  LDL 105 on 12/28/2019, atorvastatin increased to 20 mg daily.  LDL 82 on 06/27/2020  Tobacco use: smoked x 30 years for 1 ppd.  Currently is down to 2-3 cigarettes per day.  Risks of tobacco use were discussed  and cessation strongly encouraged  Leg pain: ABIs normal, no evidence of PAD  COPD: on home oxygen.  Follows with pulmonology.  RTC in 1 month  Medication Adjustments/Labs and Tests Ordered: Current medicines are reviewed at length with the patient today.  Concerns regarding medicines are outlined above.  Orders Placed This Encounter  Procedures  . Comprehensive metabolic panel  . CBC   Meds ordered this encounter  Medications  . furosemide (LASIX) 20 MG tablet    Sig: TAKE 1 TABLET(20 MG) BY MOUTH DAILY ON HOLD AS OF 3/1-per Dr. Gardiner Rhyme    Dispense:  30 tablet    Refill:  2  . Blood Pressure Monitoring (ADULT BLOOD PRESSURE CUFF LG) KIT    Sig: 1 kit by Does not apply route once for 1 dose.    Dispense:  1 kit    Refill:  0    Patient Instructions  Medication Instructions:  HOLD furosemide (Lasix) --weigh daily, write it down. Call if you gain 3 lbs overnight or 5 lbs in 1 week  STOP diltiazem (Cardizem)  *If you need a refill on your cardiac medications before your next appointment, please call your pharmacy*   Lab Work: Please return for labs  (CMET, CBC)  Our in office lab hours are Monday-Friday 8:00-4:00, closed for lunch 12:45-1:45 pm.  No appointment needed.  Follow-Up: At [facility], you and your health needs are our priority.  As part of our continuing mission to provide you with exceptional heart care, we have created designated Provider Care Teams.  These Care Teams include your primary Cardiologist (physician) and Advanced Practice Providers (APPs -  Physician Assistants and Nurse Practitioners) who all work together to provide you with the care you need, when you need it.  We recommend signing up for the patient portal called "MyChart".  Sign up information is provided on this After Visit Summary.  MyChart is used to connect with patients for Virtual Visits (Telemedicine).  Patients are able to view lab/test results, encounter notes, upcoming  appointments, etc.  Non-urgent messages can be sent to your provider as well.   To learn more about what you can do with MyChart, go to [email or web address].    Your next appointment:   1 month(s)  The  format for your next appointment:   In Person  Provider:   Oswaldo Milian, MD   Other Instructions Blood pressure cuff recommended-Omron upper arm  Please check blood pressure sitting AND standing once daily, write it down.  Call Friday to report readings to Dr. Gardiner Rhyme.     Signed, Donato Heinz, MD  08/16/2020 5:37 PM    [facility]

## 2020-08-16 NOTE — Patient Instructions (Addendum)
Medication Instructions:  HOLD furosemide (Lasix) --weigh daily, write it down. Call if you gain 3 lbs overnight or 5 lbs in 1 week  STOP diltiazem (Cardizem)  *If you need a refill on your cardiac medications before your next appointment, please call your pharmacy*   Lab Work: Please return for labs  (CMET, CBC)  Our in office lab hours are Monday-Friday 8:00-4:00, closed for lunch 12:45-1:45 pm.  No appointment needed.  Follow-Up: At [facility], you and your health needs are our priority.  As part of our continuing mission to provide you with exceptional heart care, we have created designated Provider Care Teams.  These Care Teams include your primary Cardiologist (physician) and Advanced Practice Providers (APPs -  Physician Assistants and Nurse Practitioners) who all work together to provide you with the care you need, when you need it.  We recommend signing up for the patient portal called "MyChart".  Sign up information is provided on this After Visit Summary.  MyChart is used to connect with patients for Virtual Visits (Telemedicine).  Patients are able to view lab/test results, encounter notes, upcoming appointments, etc.  Non-urgent messages can be sent to your provider as well.   To learn more about what you can do with MyChart, go to [email or web address].    Your next appointment:   1 month(s)  The format for your next appointment:   In Person  Provider:   Epifanio Lesches, MD   Other Instructions Blood pressure cuff recommended-Omron upper arm  Please check blood pressure sitting AND standing once daily, write it down.  Call Friday to report readings to Dr. Bjorn Pippin.

## 2020-08-17 DIAGNOSIS — Z7901 Long term (current) use of anticoagulants: Secondary | ICD-10-CM | POA: Diagnosis not present

## 2020-08-17 DIAGNOSIS — I480 Paroxysmal atrial fibrillation: Secondary | ICD-10-CM | POA: Diagnosis not present

## 2020-08-17 DIAGNOSIS — I10 Essential (primary) hypertension: Secondary | ICD-10-CM | POA: Diagnosis not present

## 2020-08-18 LAB — COMPREHENSIVE METABOLIC PANEL
ALT: 18 IU/L (ref 0–32)
AST: 15 IU/L (ref 0–40)
Albumin/Globulin Ratio: 1.6 (ref 1.2–2.2)
Albumin: 4 g/dL (ref 3.6–4.6)
Alkaline Phosphatase: 151 IU/L — ABNORMAL HIGH (ref 44–121)
BUN/Creatinine Ratio: 16 (ref 12–28)
BUN: 16 mg/dL (ref 8–27)
Bilirubin Total: 0.2 mg/dL (ref 0.0–1.2)
CO2: 24 mmol/L (ref 20–29)
Calcium: 9.3 mg/dL (ref 8.7–10.3)
Chloride: 105 mmol/L (ref 96–106)
Creatinine, Ser: 0.98 mg/dL (ref 0.57–1.00)
Globulin, Total: 2.5 g/dL (ref 1.5–4.5)
Glucose: 90 mg/dL (ref 65–99)
Potassium: 4.5 mmol/L (ref 3.5–5.2)
Sodium: 144 mmol/L (ref 134–144)
Total Protein: 6.5 g/dL (ref 6.0–8.5)
eGFR: 57 mL/min/{1.73_m2} — ABNORMAL LOW (ref >59)

## 2020-08-18 LAB — CBC
Hematocrit: 35.5 % (ref 34.0–46.6)
Hemoglobin: 11.7 g/dL (ref 11.1–15.9)
MCH: 29.1 pg (ref 26.6–33.0)
MCHC: 33 g/dL (ref 31.5–35.7)
MCV: 88 fL (ref 79–97)
Platelets: 374 10*3/uL (ref 150–450)
RBC: 4.02 x10E6/uL (ref 3.77–5.28)
RDW: 13.1 % (ref 11.7–15.4)
WBC: 9.6 10*3/uL (ref 3.4–10.8)

## 2020-08-22 ENCOUNTER — Telehealth: Payer: Self-pay | Admitting: Cardiology

## 2020-08-22 NOTE — Telephone Encounter (Signed)
Spoke with daughter.  Recommend continuing to hold lasix and diltiazem.  Encouraged to stay well-hydrated.  Follow-up scheduled for 4/1.

## 2020-08-22 NOTE — Telephone Encounter (Signed)
Spoke with the patient's daughter. She stated that the patient stated that her heart rate has been 144 since 1 pm today. She denies chest pain and shortness of breath.   Appointment made for 3/8. Daughter made aware.

## 2020-08-22 NOTE — Telephone Encounter (Signed)
Called patient to schedule 1 month f/u per staff message. Patient requested for Dr. Bjorn Pippin or his nurse to call her daughter, Verlon Au, [phone]. Did not want me to take a message.

## 2020-08-22 NOTE — Telephone Encounter (Signed)
I did not get an answer from triage. Please return call when able.

## 2020-08-22 NOTE — Telephone Encounter (Signed)
Spoke with daughter and advised of follow up date and time  Daughter called multiple times Friday to report blood pressure readings, unable to get through.  She wanted to speak with Dr Bjorn Pippin regarding blood pressure readings  322 wt 172.2 lb  bp sitting 110/65 standing 101/54 3-3 wt 174.1 lb  bp sitting 107/65 standing 99/50 3-4 wt 171.3 lb  bp sitting 143/82 standing 124/62 3-5 wt 170.1 lb  bp sitiing 119/62 standing 86/48 3-6 wt 172 lb   bp sitting 132/71 standing 89/63  Will forward to Dr Bjorn Pippin and [facility] RN for review

## 2020-08-22 NOTE — Telephone Encounter (Signed)
Patient's daughter is following up. She states she is concerned because as soon as she got off the phone with the patient she checked her HR and it was 144.  STAT if HR is under 50 or over 120 (normal HR is 60-100 beats per minute)  1) What is your heart rate? 144  2) Do you have a log of your heart rate readings (document readings)? No additional readings available, but patient's daughter states the patient just made her aware that her HR has been elevated all day  3) Do you have any other symptoms? No

## 2020-08-23 ENCOUNTER — Other Ambulatory Visit (HOSPITAL_COMMUNITY)
Admission: RE | Admit: 2020-08-23 | Discharge: 2020-08-23 | Disposition: A | Discharge status: Home / Self Care | Payer: Medicare Other | Source: Ambulatory Visit | Attending: Cardiology | Admitting: Cardiology

## 2020-08-23 ENCOUNTER — Encounter: Payer: Self-pay | Admitting: Cardiology

## 2020-08-23 ENCOUNTER — Other Ambulatory Visit (HOSPITAL_COMMUNITY): Payer: Medicare Other

## 2020-08-23 ENCOUNTER — Ambulatory Visit: Payer: Medicare Other | Admitting: Cardiology

## 2020-08-23 ENCOUNTER — Other Ambulatory Visit: Payer: Self-pay

## 2020-08-23 ENCOUNTER — Other Ambulatory Visit: Payer: Self-pay | Admitting: *Deleted

## 2020-08-23 VITALS — BP 124/74 | HR 151 | Ht 66.25 in | Wt 177.0 lb

## 2020-08-23 DIAGNOSIS — I4892 Unspecified atrial flutter: Secondary | ICD-10-CM

## 2020-08-23 DIAGNOSIS — I5032 Chronic diastolic (congestive) heart failure: Secondary | ICD-10-CM

## 2020-08-23 DIAGNOSIS — Z01812 Encounter for preprocedural laboratory examination: Secondary | ICD-10-CM | POA: Diagnosis not present

## 2020-08-23 DIAGNOSIS — Z20822 Contact with and (suspected) exposure to covid-19: Secondary | ICD-10-CM | POA: Insufficient documentation

## 2020-08-23 DIAGNOSIS — I951 Orthostatic hypotension: Secondary | ICD-10-CM

## 2020-08-23 LAB — BASIC METABOLIC PANEL
BUN/Creatinine Ratio: 19 (ref 12–28)
BUN: 20 mg/dL (ref 8–27)
CO2: 22 mmol/L (ref 20–29)
Calcium: 9.4 mg/dL (ref 8.7–10.3)
Chloride: 101 mmol/L (ref 96–106)
Creatinine, Ser: 1.06 mg/dL — ABNORMAL HIGH (ref 0.57–1.00)
Glucose: 109 mg/dL — ABNORMAL HIGH (ref 65–99)
Potassium: 4.2 mmol/L (ref 3.5–5.2)
Sodium: 138 mmol/L (ref 134–144)
eGFR: 52 mL/min/{1.73_m2} — ABNORMAL LOW (ref >59)

## 2020-08-23 LAB — CBC
Hematocrit: 38 % (ref 34.0–46.6)
Hemoglobin: 12.5 g/dL (ref 11.1–15.9)
MCH: 29.3 pg (ref 26.6–33.0)
MCHC: 32.9 g/dL (ref 31.5–35.7)
MCV: 89 fL (ref 79–97)
Platelets: 405 10*3/uL (ref 150–450)
RBC: 4.26 x10E6/uL (ref 3.77–5.28)
RDW: 12.8 % (ref 11.7–15.4)
WBC: 12.4 10*3/uL — ABNORMAL HIGH (ref 3.4–10.8)

## 2020-08-23 LAB — SARS CORONAVIRUS 2 (TAT 6-24 HRS): SARS Coronavirus 2: NEGATIVE

## 2020-08-23 MED ORDER — DILTIAZEM HCL ER COATED BEADS 120 MG PO CP24: 120.0000 mg | ORAL_CAPSULE | Freq: Every day | ORAL | 3 refills | Status: DC

## 2020-08-23 NOTE — Progress Notes (Signed)
Cardiology Office Note:    Date:  08/23/2020   ID:  Tiffany BashShelby Mcclees, DOB Aug 02, 1934, MRN [id number]  PCP:  Mliss SaxKremer, William Alfred, MD  Cardiologist:  No primary care provider on file.  Electrophysiologist:  None   Referring MD: Mliss SaxKremer, William Alfred,*   Chief Complaint  Patient presents with  . Atrial Flutter   History of Present Illness:    Tiffany Jennings is a 86 y.o. female with a hx of atrial fibrillation on Eliquis, COPD on oxygen, hypertension, hypothyroidism, hyperlipidemia, DVT/PE who presents for follow-up.  She was initially seen on 03/30/2019 for evaluation of atrial fibrillation.  [place] [facility] [place] [facility] [place]  Was in hospital for pneumonia several years ago and told she had AF.   She denies any symptoms from atrial fibrillation, and does not think she has had any AF since her hospitalization.  She has been on amiodarone 100 mg daily.  The notes from [place] in [place] indicate that amiodarone was to be discontinued due to her chronic respiratory disease.  However she continued to take amiodarone.  She underwent a TTE in July 2020, which showed normal EF, mild concentric hypertrophy, moderate diastolic dysfunction, no significant valvular disease reported.  She has been on Eliquis and diltiazem for her AF.  Amiodarone was discontinued at initial clinic visit in 03/30/2019.  Zio patch x14 days on 07/29/2019 showed very frequent PACs (18.1% of beats) with numerous episodes of SVT, longest lasting 7 beats.  No evidence of atrial fibrillation.  Echocardiogram on 08/12/2019 showed normal biventricular function, no significant valvular disease.  Since her last clinic visit, she noted when checking her vitals yesterday that her pulse was up to 140s.  She does not feel like her heart is racing.  Continues to have lightheadedness with standing.  Denies any syncope, chest pain, or dyspnea.  She reports compliance with her Eliquis, has not missed any doses in the last 3 weeks   Wt  Readings from Last 3 Encounters:  08/23/20 177 lb (80.3 kg)  06/27/20 177 lb (80.3 kg)  03/29/20 175 lb 6.4 oz (79.6 kg)     Past Medical History:  Diagnosis Date  . A-fib (HCC)   . COPD (chronic obstructive pulmonary disease) (HCC)   . Hyperlipidemia   . Hypertension     Past Surgical History:  Procedure Laterality Date  . CAROTID ANGIOGRAM      Current Medications: Current Meds  Medication Sig  . diltiazem (CARDIZEM CD) 120 MG 24 hr capsule Take 1 capsule (120 mg total) by mouth daily.     Allergies:   Codeine   Social History   Socioeconomic History  . Marital status: Single    Spouse name: Not on file  . Number of children: Not on file  . Years of education: Not on file  . Highest education level: Not on file  Occupational History  . Not on file  Tobacco Use  . Smoking status: Current Every Day Smoker    Packs/day: 0.25    Years: 50.00    Pack years: 12.50    Types: Cigarettes  . Smokeless tobacco: Never Used  . Tobacco comment: 2 cigs a day  Substance and Sexual Activity  . Alcohol use: Not Currently  . Drug use: Never  . Sexual activity: Not on file  Other Topics Concern  . Not on file  Social History Narrative  . Not on file   Social Determinants of Health   Financial Resource Strain: Low  Risk   . Difficulty of Paying Living Expenses: Not hard at all  Food Insecurity: No Food Insecurity  . Worried About Programme researcher, broadcasting/film/video in the Last Year: Never true  . Ran Out of Food in the Last Year: Never true  Transportation Needs: No Transportation Needs  . Lack of Transportation (Medical): No  . Lack of Transportation (Non-Medical): No  Physical Activity: Not on file  Stress: Not on file  Social Connections: Not on file     Family History: No relevant family history of heart diseaseNo relevant family history of heart disease  ROS:   Please see the history of present illness.     All other systems reviewed and are negative.  EKGs/Labs/Other  Studies Reviewed:    The following studies were reviewed today:   EKG:  EKG is ordered today.  The ekg ordered demonstrates atrial flutter, rate 152 bpm  Recent Labs: 06/27/2020: Magnesium 2.2; TSH 2.890 08/17/2020: ALT 18; BUN 16; Creatinine, Ser 0.98; Hemoglobin 11.7; Platelets 374; Potassium 4.5; Sodium 144  Recent Lipid Panel    Component Value Date/Time   CHOL 177 06/27/2020 1145   TRIG 70 06/27/2020 1145   HDL 82 06/27/2020 1145   CHOLHDL 2.2 06/27/2020 1145   LDLCALC 82 06/27/2020 1145   LDLDIRECT 86 03/06/2019 1142    Physical Exam:    VS:  BP 124/74   Pulse (!) 151   Ht 5' 6.25" (1.683 m)   Wt 177 lb (80.3 kg)   SpO2 98%   BMI 28.35 kg/m     Wt Readings from Last 3 Encounters:  08/23/20 177 lb (80.3 kg)  06/27/20 177 lb (80.3 kg)  03/29/20 175 lb 6.4 oz (79.6 kg)     GEN:  in no acute distress, on oxygen HEENT: Normal NECK: No JVD CARDIAC: Irregular, normal rate, no murmurs, rubs, gallops RESPIRATORY: Bilateral expiratory wheezing ABDOMEN: Soft, non-tender, non-distended MUSCULOSKELETAL:  No edema; No deformity  SKIN: Warm and dry NEUROLOGIC:  Alert and oriented x 3 PSYCHIATRIC:  Normal affect   ASSESSMENT:    1. Atrial flutter with rapid ventricular response (HCC)   2. Pre-procedure lab exam   3. Orthostatic hypotension   4. Chronic diastolic heart failure (HCC)    PLAN:    Atrial fibrillation/flutter: On diltiazem 120 mg and Eliquis 5 mg twice daily.  CHADS-VASc 4 (HTN, agex2, female).  TTE from 08/12/19 shows normal biventricular function, no significant valvular disease.  Amiodarone discontinued 03/2019 due to risk of pulmonary toxicity given her COPD.  Zio patch x14 days on 07/29/2019 showed very frequent PACs (18.1% of beats) with numerous episodes of SVT, longest lasting 7 beats.  No evidence of atrial fibrillation. -EKG today concerning for atrial flutter with heart rate 150 bpm.  She appears largely asymptomatic but continues to have  lightheadedness with standing.  She reports compliance with her Eliquis, has not missed any doses in the last 3 weeks.  Will schedule for cardioversion.  Her diltiazem has been held due to orthostatic hypotension, will restart for rate control.  Orthostatic hypotension: BP dropped from 133/68 lying to 87/46 at clinic appointment on 3/4.  She has been taking Lasix 20 mg 3 times weekly, have discontinued.   Diltiazem has been held but will restart given her atrial flutter with RVR.  Frequent PACs/SVT: Zio patch x14 days on 07/29/2019 showed very frequent PACs (18.1% of beats) with numerous episodes of SVT, longest lasting 7 beats.  Continue diltiazem.  TSH was low, hypothyroidism may  have been contributing to increased ectopy, her levothyroxine dose was decreased.  TSH normal on 06/27/2020.  Chronic diastolic heart failure: On Lasix 20 mg 3 times weekly.  Holding Lasix as above  Hypertension: Presented with orthostatic hypotension, held diltiazem but restarted for atrial flutter as above  Hyperlipidemia: On atorvastatin 10 mg.  LDL 105 on 12/28/2019, atorvastatin increased to 20 mg daily.  LDL 82 on 06/27/2020  Tobacco use: smoked x 30 years for 1 ppd.  Currently is down to 2-3 cigarettes per day.  Risks of tobacco use were discussed and cessation strongly encouraged  Leg pain: ABIs normal, no evidence of PAD  COPD: on home oxygen.  Follows with pulmonology.  RTC in 1 month   Medication Adjustments/Labs and Tests Ordered: Current medicines are reviewed at length with the patient today.  Concerns regarding medicines are outlined above.  Orders Placed This Encounter  Procedures  . Basic metabolic panel  . CBC  . EKG 12-Lead   Meds ordered this encounter  Medications  . diltiazem (CARDIZEM CD) 120 MG 24 hr capsule    Sig: Take 1 capsule (120 mg total) by mouth daily.    Dispense:  90 capsule    Refill:  3    Patient Instructions  Medication Instructions:  RESTART diltiazem 120 mg  daily  *If you need a refill on your cardiac medications before your next appointment, please call your pharmacy*  Testing/Procedures: Your physician has recommended that you have a Cardioversion (DCCV). Electrical Cardioversion uses a jolt of electricity to your heart either through paddles or wired patches attached to your chest. This is a controlled, usually prescheduled, procedure. Defibrillation is done under light anesthesia in the hospital, and you usually go home the day of the procedure. This is done to get your heart back into a normal rhythm. You are not awake for the procedure. Please see the instruction sheet given to you today.  Follow-Up: At [facility], you and your health needs are our priority.  As part of our continuing mission to provide you with exceptional heart care, we have created designated Provider Care Teams.  These Care Teams include your primary Cardiologist (physician) and Advanced Practice Providers (APPs -  Physician Assistants and Nurse Practitioners) who all work together to provide you with the care you need, when you need it.  We recommend signing up for the patient portal called "MyChart".  Sign up information is provided on this After Visit Summary.  MyChart is used to connect with patients for Virtual Visits (Telemedicine).  Patients are able to view lab/test results, encounter notes, upcoming appointments, etc.  Non-urgent messages can be sent to your provider as well.   To learn more about what you can do with MyChart, go to [email or web address].    Your next appointment:   As scheduled with Dr. Bjorn Pippin          Other Instructions  You are scheduled for a Cardioversion on Thursday 3/10 with Dr. Bjorn Pippin.  Please arrive at the [facility] (Main Entrance A) at [facility]: [address] [place], [place] [address] at 12:30 PM.   DIET: Nothing to eat or drink after midnight except a sip of water with medications (see medication  instructions below)  Medication Instructions:  Continue your anticoagulant: Eliquis  You will need to continue your anticoagulant after your procedure until you  are told by your  Provider that it is safe to stop   Labs:  Today in office (BMET, CBC)  You will need a COVID-19  test prior to your procedure. You are scheduled for today 3/8 at 10:55 AM. This is a Drive Up Visit at [address] [place], [place] [address]. Someone will direct you to the appropriate testing line. Stay in your car and someone will be with you shortly.  You must have a responsible person to drive you home and stay in the waiting area during your procedure. Failure to do so could result in cancellation.  Bring your insurance cards.  *Special Note: Every effort is made to have your procedure done on time. Occasionally there are emergencies that occur at the hospital that may cause delays. Please be patient if a delay does occur.       Signed, Little Ishikawa, MD  08/23/2020 10:39 AM    [facility]

## 2020-08-23 NOTE — Patient Instructions (Addendum)
Medication Instructions:  RESTART diltiazem 120 mg daily  *If you need a refill on your cardiac medications before your next appointment, please call your pharmacy*  Testing/Procedures: Your physician has recommended that you have a Cardioversion (DCCV). Electrical Cardioversion uses a jolt of electricity to your heart either through paddles or wired patches attached to your chest. This is a controlled, usually prescheduled, procedure. Defibrillation is done under light anesthesia in the hospital, and you usually go home the day of the procedure. This is done to get your heart back into a normal rhythm. You are not awake for the procedure. Please see the instruction sheet given to you today.  Follow-Up: At [facility], you and your health needs are our priority.  As part of our continuing mission to provide you with exceptional heart care, we have created designated Provider Care Teams.  These Care Teams include your primary Cardiologist (physician) and Advanced Practice Providers (APPs -  Physician Assistants and Nurse Practitioners) who all work together to provide you with the care you need, when you need it.  We recommend signing up for the patient portal called "MyChart".  Sign up information is provided on this After Visit Summary.  MyChart is used to connect with patients for Virtual Visits (Telemedicine).  Patients are able to view lab/test results, encounter notes, upcoming appointments, etc.  Non-urgent messages can be sent to your provider as well.   To learn more about what you can do with MyChart, go to [email or web address].    Your next appointment:   As scheduled with Dr. Bjorn Pippin          Other Instructions  You are scheduled for a Cardioversion on Thursday 3/10 with Dr. Bjorn Pippin.  Please arrive at the [facility] (Main Entrance A) at [facility]: [address] [place], [place] [address] at 12:30 PM.   DIET: Nothing to eat or drink after midnight except a  sip of water with medications (see medication instructions below)  Medication Instructions:  Continue your anticoagulant: Eliquis  You will need to continue your anticoagulant after your procedure until you  are told by your  Provider that it is safe to stop   Labs:  Today in office (BMET, CBC)  You will need a COVID-19  test prior to your procedure. You are scheduled for today 3/8 at 10:55 AM. This is a Drive Up Visit at [address] [place], [place] [address]. Someone will direct you to the appropriate testing line. Stay in your car and someone will be with you shortly.  You must have a responsible person to drive you home and stay in the waiting area during your procedure. Failure to do so could result in cancellation.  Bring your insurance cards.  *Special Note: Every effort is made to have your procedure done on time. Occasionally there are emergencies that occur at the hospital that may cause delays. Please be patient if a delay does occur.

## 2020-08-24 ENCOUNTER — Telehealth: Payer: Self-pay | Admitting: *Deleted

## 2020-08-24 ENCOUNTER — Ambulatory Visit (INDEPENDENT_AMBULATORY_CARE_PROVIDER_SITE_OTHER): Payer: Medicare Other | Admitting: *Deleted

## 2020-08-24 VITALS — HR 81

## 2020-08-24 DIAGNOSIS — I480 Paroxysmal atrial fibrillation: Secondary | ICD-10-CM | POA: Diagnosis not present

## 2020-08-24 NOTE — Progress Notes (Signed)
Patient presents to office for EKG prior to cardioversion 3/10.   Daughter called and patients heart rate had returned to normal since OV yesterday, concerned she had converted to NSR.     EKG completed.  HR 81-afib  Dr. Bjorn Pippin spoke with patient and daughter.  Agreed to cancel cardioversion tomorrow, continue to monitor BP and HR and home and follow up as scheduled 4/1.     Called scheduling-dccv cancelled.

## 2020-08-24 NOTE — Telephone Encounter (Signed)
Received call from daughter-daughter reports since OV yesterday and restarting diltiazem her heart rate has been consistently 60-80s all day.  She believes she may be back in sinus rhythm.    Patient is scheduled for dccv tomorrow.   Daughter and patient coming in office now to do EKG to confirm.   Ok Per Dr. Bjorn Pippin (in office).

## 2020-08-24 NOTE — Progress Notes (Signed)
EKG reviewed.  She is in atrial fibrillation, but rates are now well controlled, rate 81 bpm.  Patient does not wish to have cardioversion done tomorrow.  Discussed that since rates controlled and she appears asymptomatic, there is no urgency to cardioversion and could hold off to see if she converts to sinus rhythm.  Follow-up is scheduled for 3 weeks from now.  If she remains in A. fib at that time, would recommend cardioversion at that time to restore sinus rhythm.

## 2020-08-24 NOTE — Patient Instructions (Signed)
Cardioversion cancelled for tomorrow 3/10  Follow up with Dr. Bjorn Pippin 4/1 as scheduled  Monitor BP and HR at home

## 2020-08-25 ENCOUNTER — Encounter (HOSPITAL_COMMUNITY): Admission: RE | Payer: Self-pay | Source: Home / Self Care

## 2020-08-25 ENCOUNTER — Ambulatory Visit (HOSPITAL_COMMUNITY): Admission: RE | Admit: 2020-08-25 | Payer: Medicare Other | Source: Home / Self Care | Admitting: Cardiology

## 2020-08-25 SURGERY — CARDIOVERSION
Anesthesia: General

## 2020-09-14 ENCOUNTER — Other Ambulatory Visit: Payer: Self-pay | Admitting: Family

## 2020-09-15 DIAGNOSIS — J449 Chronic obstructive pulmonary disease, unspecified: Secondary | ICD-10-CM | POA: Diagnosis not present

## 2020-09-15 NOTE — Progress Notes (Deleted)
Cardiology Office Note:    Date:  09/16/2020   ID:  Tiffany Jennings, DOB Jan 02, 1935, MRN [id number]  PCP:  Mliss Sax, MD  Cardiologist:  No primary care provider on file.  Electrophysiologist:  None   Referring MD: Mliss Sax,*   Chief Complaint  Patient presents with  . Follow-up    1 month.   History of Present Illness:    Tiffany Jennings is a 86 y.o. female with a hx of atrial fibrillation on Eliquis, COPD on oxygen, hypertension, hypothyroidism, hyperlipidemia, DVT/PE who presents for follow-up.  She was initially seen on 03/30/2019 for evaluation of atrial fibrillation.  She had moved to the area from [facility].  Was in hospital for pneumonia several years ago and told she had AF.   She denies any symptoms from atrial fibrillation, and does not think she has had any AF since her hospitalization.  She has been on amiodarone 100 mg daily.  The notes from her PCP in [place] indicate that amiodarone was to be discontinued due to her chronic respiratory disease.  However she continued to take amiodarone.  She underwent a TTE in July 2020, which showed normal EF, mild concentric hypertrophy, moderate diastolic dysfunction, no significant valvular disease reported.  She has been on Eliquis and diltiazem for her AF.  Amiodarone was discontinued at initial clinic visit in 03/30/2019.  Zio patch x14 days on 07/29/2019 showed very frequent PACs (18.1% of beats) with numerous episodes of SVT, longest lasting 7 beats.  No evidence of atrial fibrillation.  Echocardiogram on 08/12/2019 showed normal biventricular function, no significant valvular disease.  Had appointment on 08/16/2020 and noted to have significant orthostatic hypotension.  Her Lasix and diltiazem were discontinued.  Subsequently presented on 3/8 with atrial flutter, rate 150 bpm.  Diltiazem was restarted and repeat EKG on 3/9 showed rates had improved to 80s.  Since her last clinic visit,  Still having  lightheadedness but just first thing in morning.    she noted when checking her vitals yesterday that her pulse was up to 140s.  She does not feel like her heart is racing.  Continues to have lightheadedness with standing.  Denies any syncope, chest pain, or dyspnea.  She reports compliance with her Eliquis, has not missed any doses in the last 3 weeks   Wt Readings from Last 3 Encounters:  09/16/20 177 lb (80.3 kg)  08/23/20 177 lb (80.3 kg)  06/27/20 177 lb (80.3 kg)     Past Medical History:  Diagnosis Date  . A-fib (HCC)   . COPD (chronic obstructive pulmonary disease) (HCC)   . Hyperlipidemia   . Hypertension     Past Surgical History:  Procedure Laterality Date  . CAROTID ANGIOGRAM      Current Medications: Current Meds  Medication Sig  . albuterol (VENTOLIN HFA) 108 (90 Base) MCG/ACT inhaler INHALE 2 PUFFS INTO THE LUNGS EVERY 6 HOURS AS NEEDED FOR WHEEZING OR SHORTNESS OF BREATH  . atorvastatin (LIPITOR) 20 MG tablet Take 1 tablet (20 mg total) by mouth daily.  . carboxymethylcellulose (REFRESH PLUS) 0.5 % SOLN 1 drop 3 (three) times daily as needed.  . diltiazem (CARDIZEM CD) 120 MG 24 hr capsule Take 1 capsule (120 mg total) by mouth daily.  Marland Kitchen donepezil (ARICEPT) 10 MG tablet TAKE 1 TABLET(10 MG) BY MOUTH AT BEDTIME  . ELIQUIS 5 MG TABS tablet TAKE 1 TABLET(5 MG) BY MOUTH TWICE DAILY  . imipramine (TOFRANIL) 50 MG tablet TAKE 1 TO 2  TABLETS BY MOUTH NIGHTLY  . levothyroxine (SYNTHROID) 50 MCG tablet Take 1 tablet (50 mcg total) by mouth daily before breakfast.  . TRELEGY ELLIPTA 100-62.5-25 MCG/INH AEPB Inhale 1 puff into the lungs daily.  . [DISCONTINUED] furosemide (LASIX) 20 MG tablet TAKE 1 TABLET(20 MG) BY MOUTH DAILY     Allergies:   Codeine   Social History   Socioeconomic History  . Marital status: Single    Spouse name: Not on file  . Number of children: Not on file  . Years of education: Not on file  . Highest education level: Not on file   Occupational History  . Not on file  Tobacco Use  . Smoking status: Current Every Day Smoker    Packs/day: 0.25    Years: 50.00    Pack years: 12.50    Types: Cigarettes  . Smokeless tobacco: Never Used  . Tobacco comment: 2 cigs a day  Substance and Sexual Activity  . Alcohol use: Not Currently  . Drug use: Never  . Sexual activity: Not on file  Other Topics Concern  . Not on file  Social History Narrative  . Not on file   Social Determinants of Health   Financial Resource Strain: Low Risk   . Difficulty of Paying Living Expenses: Not hard at all  Food Insecurity: No Food Insecurity  . Worried About Programme researcher, broadcasting/film/video in the Last Year: Never true  . Ran Out of Food in the Last Year: Never true  Transportation Needs: No Transportation Needs  . Lack of Transportation (Medical): No  . Lack of Transportation (Non-Medical): No  Physical Activity: Not on file  Stress: Not on file  Social Connections: Not on file     Family History: No relevant family history of heart diseaseNo relevant family history of heart disease  ROS:   Please see the history of present illness.     All other systems reviewed and are negative.  EKGs/Labs/Other Studies Reviewed:    The following studies were reviewed today:   EKG:  EKG is ordered today.  The ekg ordered demonstrates atrial flutter, rate 152 bpm  Recent Labs: 06/27/2020: Magnesium 2.2; TSH 2.890 08/17/2020: ALT 18 08/23/2020: BUN 20; Creatinine, Ser 1.06; Hemoglobin 12.5; Platelets 405; Potassium 4.2; Sodium 138  Recent Lipid Panel    Component Value Date/Time   CHOL 177 06/27/2020 1145   TRIG 70 06/27/2020 1145   HDL 82 06/27/2020 1145   CHOLHDL 2.2 06/27/2020 1145   LDLCALC 82 06/27/2020 1145   LDLDIRECT 86 03/06/2019 1142    Physical Exam:    VS:  BP 128/60 (BP Location: Left Arm, Patient Position: Sitting, Cuff Size: Normal)   Pulse 74   Ht 5\' 6"  (1.676 m)   Wt 177 lb (80.3 kg)   BMI 28.57 kg/m     Wt Readings  from Last 3 Encounters:  09/16/20 177 lb (80.3 kg)  08/23/20 177 lb (80.3 kg)  06/27/20 177 lb (80.3 kg)     GEN:  in no acute distress, on oxygen HEENT: Normal NECK: No JVD CARDIAC: Irregular, normal rate, no murmurs, rubs, gallops RESPIRATORY: Bilateral expiratory wheezing ABDOMEN: Soft, non-tender, non-distended MUSCULOSKELETAL:  No edema; No deformity  SKIN: Warm and dry NEUROLOGIC:  Alert and oriented x 3 PSYCHIATRIC:  Normal affect   ASSESSMENT:    No diagnosis found. PLAN:    Atrial fibrillation/flutter: On diltiazem 120 mg and Eliquis 5 mg twice daily.  CHADS-VASc 4 (HTN, agex2, female).  TTE from  08/12/19 shows normal biventricular function, no significant valvular disease.  Amiodarone discontinued 03/2019 due to risk of pulmonary toxicity given her COPD.  Zio patch x14 days on 07/29/2019 showed very frequent PACs (18.1% of beats) with numerous episodes of SVT, longest lasting 7 beats.  No evidence of atrial fibrillation.  Had recurrence of atrial flutter on 3/8 while diltiazem was held due to orthostatic hypotension, rates up to 150s.  Rate improved to 80s with restarting diltiazem.. -EKG today with sinus rhythm with PACs.  Will check Zio patch x7 days to assess A. fib burden  Orthostatic hypotension: BP dropped from 133/68 lying to 87/46 at clinic appointment on 3/4.  She had been taking Lasix 20 mg 3 times weekly, have discontinued.   Diltiazem has been held but was restarted given her atrial flutter with RVR.  Suspect due to Lasix use, has been discontinued.  Appears improved.  Frequent PACs/SVT: Zio patch x14 days on 07/29/2019 showed very frequent PACs (18.1% of beats) with numerous episodes of SVT, longest lasting 7 beats.  Continue diltiazem.  TSH was low, hypothyroidism may have been contributing to increased ectopy, her levothyroxine dose was decreased.  TSH normal on 06/27/2020.  Chronic diastolic heart failure: Was on Lasix 20 mg 3 times weekly.  Discontinue Lasix due to  orthostatic hypotension as above.  Hypertension: Presented with orthostatic hypotension, held diltiazem but restarted for atrial flutter as above  Hyperlipidemia: On atorvastatin 10 mg.  LDL 105 on 12/28/2019, atorvastatin increased to 20 mg daily.  LDL 82 on 06/27/2020  Tobacco use: smoked x 30 years for 1 ppd.  Currently is down to 2-3 cigarettes per day.  Risks of tobacco use were discussed and cessation strongly encouraged  Leg pain: ABIs normal, no evidence of PAD  COPD: on home oxygen.  Follows with pulmonology.  RTC in 3 months   Medication Adjustments/Labs and Tests Ordered: Current medicines are reviewed at length with the patient today.  Concerns regarding medicines are outlined above.  No orders of the defined types were placed in this encounter.  No orders of the defined types were placed in this encounter.   There are no Patient Instructions on file for this visit.   Signed, Little Ishikawa, MD  09/16/2020 11:35 AM    [facility]

## 2020-09-16 ENCOUNTER — Encounter: Payer: Self-pay | Admitting: Radiology

## 2020-09-16 ENCOUNTER — Ambulatory Visit (INDEPENDENT_AMBULATORY_CARE_PROVIDER_SITE_OTHER): Payer: Medicare Other

## 2020-09-16 ENCOUNTER — Ambulatory Visit: Payer: Medicare Other | Admitting: Cardiology

## 2020-09-16 ENCOUNTER — Encounter: Payer: Self-pay | Admitting: Cardiology

## 2020-09-16 ENCOUNTER — Other Ambulatory Visit: Payer: Self-pay

## 2020-09-16 VITALS — BP 128/60 | HR 74 | Ht 66.0 in | Wt 177.0 lb

## 2020-09-16 DIAGNOSIS — I480 Paroxysmal atrial fibrillation: Secondary | ICD-10-CM | POA: Diagnosis not present

## 2020-09-16 DIAGNOSIS — E785 Hyperlipidemia, unspecified: Secondary | ICD-10-CM | POA: Diagnosis not present

## 2020-09-16 DIAGNOSIS — I951 Orthostatic hypotension: Secondary | ICD-10-CM

## 2020-09-16 DIAGNOSIS — I5032 Chronic diastolic (congestive) heart failure: Secondary | ICD-10-CM

## 2020-09-16 DIAGNOSIS — I10 Essential (primary) hypertension: Secondary | ICD-10-CM

## 2020-09-16 NOTE — Progress Notes (Signed)
Enrolled patient for a 7 day Zio XT monitor to be mailed to patients home.  

## 2020-09-16 NOTE — Patient Instructions (Addendum)
Medication Instructions:  Continue same medications *If you need a refill on your cardiac medications before your next appointment, please call your pharmacy*   Lab Work: None ordered   Testing/Procedures:   ZIO XT- Long Term Monitor Instructions   Your physician has requested you wear your ZIO patch monitor____7___days.   This is a single patch monitor.  [employer] supplies one patch monitor per enrollment.  Additional stickers are not available.   Please do not apply patch if you will be having a Nuclear Stress Test, Echocardiogram, Cardiac CT, MRI, or Chest Xray during the time frame you would be wearing the monitor. The patch cannot be worn during these tests.  You cannot remove and re-apply the ZIO XT patch monitor.   Your ZIO patch monitor will be sent USPS Priority mail from [facility] directly to your home address. The monitor may also be mailed to a PO BOX if home delivery is not available.   It may take 3-5 days to receive your monitor after you have been enrolled.   Once you have received you monitor, please review enclosed instructions.  Your monitor has already been registered assigning a specific monitor serial # to you.   Applying the monitor   Shave hair from upper left chest.   Hold abrader disc by orange tab.  Rub abrader in 40 strokes over left upper chest as indicated in your monitor instructions.   Clean area with 4 enclosed alcohol pads .  Use all pads to assure are is cleaned thoroughly.  Let dry.   Apply patch as indicated in monitor instructions.  Patch will be place under collarbone on left side of chest with arrow pointing upward.   Rub patch adhesive wings for 2 minutes.Remove white label marked "1".  Remove white label marked "2".  Rub patch adhesive wings for 2 additional minutes.   While looking in a mirror, press and release button in center of patch.  A small green light will flash 3-4 times .  This will be your only indicator the monitor has  been turned on.     Do not shower for the first 24 hours.  You may shower after the first 24 hours.   Press button if you feel a symptom. You will hear a small click.  Record Date, Time and Symptom in the Patient Log Book.   When you are ready to remove patch, follow instructions on last 2 pages of Patient Log Book.  Stick patch monitor onto last page of Patient Log Book.   Place Patient Log Book in [place] box.  Use locking tab on box and tape box closed securely.  The [employer] and [employer] has [employer] on it.  Please place in mailbox as soon as possible.  Your physician should have your test results approximately 7 days after the monitor has been mailed back to [facility].   Call [facility] Customer Care at [phone] if you have questions regarding your ZIO XT patch monitor.  Call them immediately if you see an orange light blinking on your monitor.   If your monitor falls off in less than 4 days contact our Monitor department at [phone].  If your monitor becomes loose or falls off after 4 days call [employer] at [phone] for suggestions on securing your monitor.     Follow-Up: At [facility], you and your health needs are our priority.  As part of our continuing mission to provide you with exceptional heart care, we have created  designated Provider Care Teams.  These Care Teams include your primary Cardiologist (physician) and Advanced Practice Providers (APPs -  Physician Assistants and Nurse Practitioners) who all work together to provide you with the care you need, when you need it.  We recommend signing up for the patient portal called "MyChart".  Sign up information is provided on this After Visit Summary.  MyChart is used to connect with patients for Virtual Visits (Telemedicine).  Patients are able to view lab/test results, encounter notes, upcoming appointments, etc.  Non-urgent messages can be sent to your provider as well.   To learn more about what you can  do with MyChart, go to [email or web address].    Your next appointment:  3 months    Wednesday 01/04/21 at 11:20 am   The format for your next appointment: Office    Provider:  Dr.Schumann

## 2020-09-16 NOTE — Progress Notes (Signed)
Cardiology Office Note:    Date:  09/16/2020   ID:  Tiffany Jennings, DOB 1935-01-10, MRN [id number]  PCP:  Mliss Sax, MD  Cardiologist:  No primary care provider on file.  Electrophysiologist:  None   Referring MD: Mliss Sax,*   Chief Complaint  Patient presents with  . Follow-up    1 month.  . Atrial Fibrillation   History of Present Illness:    Tiffany Jennings is a 86 y.o. female with a hx of atrial fibrillation on Eliquis, COPD on oxygen, hypertension, hypothyroidism, hyperlipidemia, DVT/PE who presents for follow-up.  She was initially seen on 03/30/2019 for evaluation of atrial fibrillation.  She had moved to the area from [facility].  Was in hospital for pneumonia several years ago and told she had AF.   She denies any symptoms from atrial fibrillation, and does not think she has had any AF since her hospitalization.  She has been on amiodarone 100 mg daily.  The notes from her PCP in [place] indicate that amiodarone was to be discontinued due to her chronic respiratory disease.  However she continued to take amiodarone.  She underwent a TTE in July 2020, which showed normal EF, mild concentric hypertrophy, moderate diastolic dysfunction, no significant valvular disease reported.  She has been on Eliquis and diltiazem for her AF.  Amiodarone was discontinued at initial clinic visit in 03/30/2019.  Zio patch x14 days on 07/29/2019 showed very frequent PACs (18.1% of beats) with numerous episodes of SVT, longest lasting 7 beats.  No evidence of atrial fibrillation.  Echocardiogram on 08/12/2019 showed normal biventricular function, no significant valvular disease.  Had appointment on 08/16/2020 and noted to have significant orthostatic hypotension.  Her Lasix and diltiazem were discontinued.  Subsequently presented on 3/8 with atrial flutter, rate 150 bpm.  Diltiazem was restarted and repeat EKG on 3/9 showed rates had improved to 80s.  She is accompanied by her  daughter. Since her last clinic visit, she has started consistently logging her HR and Ox stat.  She has at home SP O2 that range in the 90s and HR 60-80s. She is still having lightheadedness when changing positions that only occur in the morning that last a few seconds. Her lightheadedness does not occur during the day and at night. She denies any syncope episodes, chest pain or tightness, dyspnea. She is no longer taking Lasix and she reports that she is doing well at this time. She drinks 3-4 bottles of water each day. She notes that she is trying to lose 30 lbs but mainly in her abdominal area. She walks 1 mile and denies having any exertional pain or SOB. She often takes breaks throughout her walks. Sometimes she unintentional skips her morning medications but she tries to be consistent with her night medications.  Wt Readings from Last 3 Encounters:  09/16/20 177 lb (80.3 kg)  08/23/20 177 lb (80.3 kg)  06/27/20 177 lb (80.3 kg)     Past Medical History:  Diagnosis Date  . A-fib (HCC)   . COPD (chronic obstructive pulmonary disease) (HCC)   . Hyperlipidemia   . Hypertension     Past Surgical History:  Procedure Laterality Date  . CAROTID ANGIOGRAM      Current Medications: Current Meds  Medication Sig  . albuterol (VENTOLIN HFA) 108 (90 Base) MCG/ACT inhaler INHALE 2 PUFFS INTO THE LUNGS EVERY 6 HOURS AS NEEDED FOR WHEEZING OR SHORTNESS OF BREATH  . atorvastatin (LIPITOR) 20 MG tablet Take 1 tablet (  20 mg total) by mouth daily.  . carboxymethylcellulose (REFRESH PLUS) 0.5 % SOLN 1 drop 3 (three) times daily as needed.  . diltiazem (CARDIZEM CD) 120 MG 24 hr capsule Take 1 capsule (120 mg total) by mouth daily.  Marland Kitchen donepezil (ARICEPT) 10 MG tablet TAKE 1 TABLET(10 MG) BY MOUTH AT BEDTIME  . ELIQUIS 5 MG TABS tablet TAKE 1 TABLET(5 MG) BY MOUTH TWICE DAILY  . imipramine (TOFRANIL) 50 MG tablet TAKE 1 TO 2 TABLETS BY MOUTH NIGHTLY  . levothyroxine (SYNTHROID) 50 MCG tablet Take 1  tablet (50 mcg total) by mouth daily before breakfast.  . TRELEGY ELLIPTA 100-62.5-25 MCG/INH AEPB Inhale 1 puff into the lungs daily.  . [DISCONTINUED] furosemide (LASIX) 20 MG tablet TAKE 1 TABLET(20 MG) BY MOUTH DAILY     Allergies:   Codeine   Social History   Socioeconomic History  . Marital status: Single    Spouse name: Not on file  . Number of children: Not on file  . Years of education: Not on file  . Highest education level: Not on file  Occupational History  . Not on file  Tobacco Use  . Smoking status: Current Every Day Smoker    Packs/day: 0.25    Years: 50.00    Pack years: 12.50    Types: Cigarettes  . Smokeless tobacco: Never Used  . Tobacco comment: 2 cigs a day  Substance and Sexual Activity  . Alcohol use: Not Currently  . Drug use: Never  . Sexual activity: Not on file  Other Topics Concern  . Not on file  Social History Narrative  . Not on file   Social Determinants of Health   Financial Resource Strain: Low Risk   . Difficulty of Paying Living Expenses: Not hard at all  Food Insecurity: No Food Insecurity  . Worried About Programme researcher, broadcasting/film/video in the Last Year: Never true  . Ran Out of Food in the Last Year: Never true  Transportation Needs: No Transportation Needs  . Lack of Transportation (Medical): No  . Lack of Transportation (Non-Medical): No  Physical Activity: Not on file  Stress: Not on file  Social Connections: Not on file     Family History: No relevant family history of heart disease  ROS:   Please see the history of present illness.  (+)Lightheadness    All other systems reviewed and are negative.  EKGs/Labs/Other Studies Reviewed:    The following studies were reviewed today:   EKG:   09/16/20-Sinus with PACs, rate 74 bpm   08/23/20-Atrial flutter, rate 152 bpm  Recent Labs: 06/27/2020: Magnesium 2.2; TSH 2.890 08/17/2020: ALT 18 08/23/2020: BUN 20; Creatinine, Ser 1.06; Hemoglobin 12.5; Platelets 405; Potassium 4.2;  Sodium 138  Recent Lipid Panel    Component Value Date/Time   CHOL 177 06/27/2020 1145   TRIG 70 06/27/2020 1145   HDL 82 06/27/2020 1145   CHOLHDL 2.2 06/27/2020 1145   LDLCALC 82 06/27/2020 1145   LDLDIRECT 86 03/06/2019 1142    Physical Exam:    VS:  BP 128/60 (BP Location: Left Arm, Patient Position: Sitting, Cuff Size: Normal)   Pulse 74   Ht 5\' 6"  (1.676 m)   Wt 177 lb (80.3 kg)   BMI 28.57 kg/m     Wt Readings from Last 3 Encounters:  09/16/20 177 lb (80.3 kg)  08/23/20 177 lb (80.3 kg)  06/27/20 177 lb (80.3 kg)     GEN:  in no acute distress, on oxygen  HEENT: Normal NECK: No JVD CARDIAC: Irregular rhythm, normal rate, no murmurs, rubs, gallops RESPIRATORY: Mild bilateral expiratory wheezing ABDOMEN: Soft, non-tender, non-distended MUSCULOSKELETAL:  No edema; No deformity  SKIN: Warm and dry NEUROLOGIC:  Alert and oriented x 3 PSYCHIATRIC:  Normal affect   ASSESSMENT:    1. Paroxysmal atrial fibrillation (HCC)   2. Orthostatic hypotension   3. Chronic diastolic heart failure (HCC)   4. Essential hypertension   5. Hyperlipidemia, unspecified hyperlipidemia type    PLAN:    Atrial fibrillation/flutter: On diltiazem 120 mg and Eliquis 5 mg twice daily.  CHADS-VASc 4 (HTN, agex2, female).  TTE from 08/12/19 shows normal biventricular function, no significant valvular disease.  Amiodarone discontinued 03/2019 due to risk of pulmonary toxicity given her COPD.  Zio patch x14 days on 07/29/2019 showed very frequent PACs (18.1% of beats) with numerous episodes of SVT, longest lasting 7 beats.  No evidence of atrial fibrillation.  Had recurrence of atrial flutter on 3/8 while diltiazem was held due to orthostatic hypotension, rates up to 150s.  Rate improved to 80s with restarting diltiazem.. -EKG today with sinus rhythm with PACs.  Will check Zio patch x7 days to assess A. fib burden  Orthostatic hypotension: BP dropped from 133/68 lying to 87/46 at clinic appointment  on 3/4.  She had been taking Lasix 20 mg 3 times weekly, have discontinued.   Diltiazem has been held but was restarted given her atrial flutter with RVR.  Suspect due to Lasix use, has been discontinued.  Appears improved.  Frequent PACs/SVT: Zio patch x14 days on 07/29/2019 showed very frequent PACs (18.1% of beats) with numerous episodes of SVT, longest lasting 7 beats.  Continue diltiazem.  TSH was low, hypothyroidism may have been contributing to increased ectopy, her levothyroxine dose was decreased.  TSH normal on 06/27/2020.  Chronic diastolic heart failure: Was on Lasix 20 mg 3 times weekly.  Discontinue Lasix due to orthostatic hypotension as above.  Hypertension: Presented with orthostatic hypotension, held diltiazem but restarted for atrial flutter as above  Hyperlipidemia: On atorvastatin 10 mg.  LDL 105 on 12/28/2019, atorvastatin increased to 20 mg daily.  LDL 82 on 06/27/2020  Tobacco use: smoked x 30 years for 1 ppd.  Currently is down to 2-3 cigarettes per day.  Risks of tobacco use were discussed and cessation strongly encouraged  Leg pain: ABIs normal, no evidence of PAD  COPD: on home oxygen.  Follows with pulmonology.  RTC in 3 months   Medication Adjustments/Labs and Tests Ordered: Current medicines are reviewed at length with the patient today.  Concerns regarding medicines are outlined above.  Orders Placed This Encounter  Procedures  . LONG TERM MONITOR (3-14 DAYS)  . EKG 12-Lead   No orders of the defined types were placed in this encounter.   There are no Patient Instructions on file for this visit.    I,Alexis Bryant,acting as a Neurosurgeon for Little Ishikawa, MD.,have documented all relevant documentation on the behalf of Little Ishikawa, MD,as directed by  Little Ishikawa, MD while in the presence of Little Ishikawa, MD.   Signed, Little Ishikawa, MD  09/16/2020 11:48 AM    [facility]

## 2020-09-18 ENCOUNTER — Other Ambulatory Visit: Payer: Self-pay | Admitting: Family Medicine

## 2020-09-26 DIAGNOSIS — I480 Paroxysmal atrial fibrillation: Secondary | ICD-10-CM | POA: Diagnosis not present

## 2020-10-03 NOTE — Progress Notes (Signed)
Subjective:   Tiffany Jennings is a 86 y.o. female who presents for Medicare Annual (Subsequent) preventive examination.   Review of Systems     Cardiac Risk Factors include: advanced age (>48men, >75 women);hypertension;dyslipidemia;smoking/ tobacco exposure     Objective:    Today's Vitals   10/04/20 1332  BP: 108/64  Pulse: 62  Resp: 16  Temp: (!) 97.2 F (36.2 C)  TempSrc: Temporal  SpO2: 97%  Weight: 176 lb 6.4 oz (80 kg)  Height: 5\' 6"  (1.676 m)   Body mass index is 28.47 kg/m.  Advanced Directives 10/04/2020 09/30/2019 07/06/2019  Does Patient Have a Medical Advance Directive? Yes Yes Yes  Type of 07/08/2019 of Tiffany Jennings;Living will Healthcare Power of Tiffany Jennings;Living will  Does patient want to make changes to medical advance directive? - No - Patient declined -  Copy of Healthcare Power of Attorney in Chart? No - copy requested No - copy requested No - copy requested    Current Medications (verified) Outpatient Encounter Medications as of 10/04/2020  Medication Sig  . albuterol (VENTOLIN HFA) 108 (90 Base) MCG/ACT inhaler INHALE 2 PUFFS INTO THE LUNGS EVERY 6 HOURS AS NEEDED FOR WHEEZING OR SHORTNESS OF BREATH  . atorvastatin (LIPITOR) 20 MG tablet Take 1 tablet (20 mg total) by mouth daily.  . carboxymethylcellulose (REFRESH PLUS) 0.5 % SOLN 1 drop 3 (three) times daily as needed.  . diltiazem (CARDIZEM CD) 120 MG 24 hr capsule Take 1 capsule (120 mg total) by mouth daily.  10/06/2020 donepezil (ARICEPT) 10 MG tablet TAKE 1 TABLET(10 MG) BY MOUTH AT BEDTIME  . ELIQUIS 5 MG TABS tablet TAKE 1 TABLET(5 MG) BY MOUTH TWICE DAILY  . imipramine (TOFRANIL) 50 MG tablet TAKE 1 TO 2 TABLETS BY MOUTH NIGHTLY  . levothyroxine (SYNTHROID) 50 MCG tablet Take 1 tablet (50 mcg total) by mouth daily before breakfast.  . TRELEGY ELLIPTA 100-62.5-25 MCG/INH AEPB Inhale 1 puff into the lungs daily.   No facility-administered encounter  medications on file as of 10/04/2020.    Allergies (verified) Codeine   History: Past Medical History:  Diagnosis Date  . A-fib (HCC)   . COPD (chronic obstructive pulmonary disease) (HCC)   . Hyperlipidemia   . Hypertension    Past Surgical History:  Procedure Laterality Date  . CAROTID ANGIOGRAM     No family history on file. Social History   Socioeconomic History  . Marital status: Single    Spouse name: Not on file  . Number of children: Not on file  . Years of education: Not on file  . Highest education level: Not on file  Occupational History  . Not on file  Tobacco Use  . Smoking status: Current Every Day Smoker    Packs/day: 0.25    Years: 50.00    Pack years: 12.50    Types: Cigarettes  . Smokeless tobacco: Never Used  . Tobacco comment: 2 cigs a day  Substance and Sexual Activity  . Alcohol use: Not Currently  . Drug use: Never  . Sexual activity: Not on file  Other Topics Concern  . Not on file  Social History Narrative  . Not on file   Social Determinants of Health   Financial Resource Strain: Low Risk   . Difficulty of Paying Living Expenses: Not hard at all  Food Insecurity: No Food Insecurity  . Worried About 10/06/2020 in the Last Year: Never true  . Ran Out of  Food in the Last Year: Never true  Transportation Needs: No Transportation Needs  . Lack of Transportation (Medical): No  . Lack of Transportation (Non-Medical): No  Physical Activity: Sufficiently Active  . Days of Exercise per Week: 7 days  . Minutes of Exercise per Session: 30 min  Stress: No Stress Concern Present  . Feeling of Stress : Not at all  Social Connections: Moderately Isolated  . Frequency of Communication with Friends and Family: More than three times a week  . Frequency of Social Gatherings with Friends and Family: Once a week  . Attends Religious Services: Never  . Active Member of Clubs or Organizations: Yes  . Attends BankerClub or Organization Meetings: 1  to 4 times per year  . Marital Status: Widowed    Tobacco Counseling Ready to quit: Not Answered Counseling given: Not Answered Comment: 2 cigs a day   Clinical Intake:  Pre-visit preparation completed: No  Pain : No/denies pain     Nutritional Status: BMI 25 -29 Overweight Nutritional Risks: None Diabetes: No  How often do you need to have someone help you when you read instructions, pamphlets, or other written materials from your doctor or pharmacy?: 1 - Never  Diabetic?No  Interpreter Needed?: No  Information entered by :: Tiffany SalesMartha Camerin Ladouceur LPN   Activities of Daily Living In your present state of health, do you have any difficulty performing the following activities: 10/04/2020  Hearing? N  Vision? N  Difficulty concentrating or making decisions? N  Walking or climbing stairs? N  Dressing or bathing? N  Doing errands, shopping? N  Preparing Food and eating ? N  Using the Toilet? N  In the past six months, have you accidently leaked urine? N  Do you have problems with loss of bowel control? N  Managing your Medications? N  Managing your Finances? N  Housekeeping or managing your Housekeeping? N  Some recent data might be hidden    Patient Care Team: Mliss SaxKremer, Tiffany Alfred, MD as PCP - General (Family Medicine) Gaspar ColaFleury, Tiffany A, [facility] as Pharmacist (Pharmacist)  Indicate any recent Medical Services you may have received from other than [facility] providers in the past year (date may be approximate).     Assessment:   This is a routine wellness examination for [place].  Hearing/Vision screen  Hearing Screening   125Hz  250Hz  500Hz  1000Hz  2000Hz  3000Hz  4000Hz  6000Hz  8000Hz   Right ear:           Left ear:           Comments: C/o some hearing loss  Vision Screening Comments: Last eye exam-2021-Dr. Groat  Dietary issues and exercise activities discussed: Current Exercise Habits: Home exercise routine, Type of exercise: walking, Time (Minutes): 30, Frequency  (Times/Week): 7, Weekly Exercise (Minutes/Week): 210, Intensity: Mild, Exercise limited by: Other - see comments (uses a walker)  Goals    . Chronic Care Management     CARE PLAN ENTRY  Current Barriers:  . Chronic Disease Management support, education, and care coordination needs related to Hyperlipidemia, COPD, Chronic Kidney Disease, Hypothyroidism, and Pre-Diabetes   Hyperlipidemia . Pharmacist Clinical Goal(s): o Over the next 90 days, patient will work with PharmD and providers to maintain LDL goal < 100 . Current regimen:  o Atorvastatin 10 mg   Pre-Diabetes . Pharmacist Clinical Goal(s): o Over the next 90 days, patient will work with PharmD and providers to prevent progression to Diabetes . Interventions: o Recommend following a low-carbohydrate diet and incorporating more non-starchy vegetables.  .Marland Kitchen  Patient self care activities - Over the next 90 days, patient will: o Limit intake of Starchy Foods o Work to increase activity to 150 minutes of moderate-intensity exercise weekly  Atrial Fibrillation . Pharmacist Clinical Goal(s) o Over the next 90 days, patient will work with PharmD and providers to prevent symptoms of heart flutter . Current regimen:  o Diltiazem 120 mg  o Eliquis 5 mg   Medication management . Pharmacist Clinical Goal(s): o Over the next 90 days, patient will work with PharmD and providers to maintain optimal medication adherence . Current pharmacy: [facility] . Interventions o Comprehensive medication review performed. o Continue current medication management strategy. If you have any questions about me helping dispense your medications, let me know.  . Patient self care activities - Over the next 90 days, patient will: o Take medications as prescribed o Report any questions or concerns to PharmD and/or provider(s) o Reach out to the [facility] counselors to see if you qualify for the Extra Help Program to help with medication costs. You can reach them at  [phone]    . DIET - INCREASE WATER INTAKE    . Patient Stated     Would like to lose some weight      Depression Screen PHQ 2/9 Scores 10/04/2020 09/30/2019 06/22/2019 06/22/2019 03/06/2019  PHQ - 2 Score 0 0 2 0 0  PHQ- 9 Score - - 8 - 0    Fall Risk Fall Risk  10/04/2020 09/30/2019 06/22/2019  Falls in the past year? 0 0 0  Number falls in past yr: 0 0 -  Injury with Fall? 0 0 -  Follow up Falls prevention discussed Education provided;Falls prevention discussed -    FALL RISK PREVENTION PERTAINING TO THE HOME:  Any stairs in or around the home? Yes  If so, are there any without handrails? No  Home free of loose throw rugs in walkways, pet beds, electrical cords, etc? Yes  Adequate lighting in your home to reduce risk of falls? Yes   ASSISTIVE DEVICES UTILIZED TO PREVENT FALLS:  Life alert? No  Use of a cane, walker or w/c? Yes  Grab bars in the bathroom? Yes  Shower chair or bench in shower? No  Elevated toilet seat or a handicapped toilet? No   TIMED UP AND GO:  Was the test performed? Yes .  Length of time to ambulate 10 feet: 11 sec.   Gait steady and fast with assistive device  Cognitive Function:Normal cognitive status assessed by direct observation by this Nurse Health Advisor. No abnormalities found.          Immunizations Immunization History  Administered Date(s) Administered  . Fluad Quad(high Dose 65+) 03/06/2019, 03/29/2020  . Influenza Whole 02/18/2018  . PFIZER(Purple Top)SARS-COV-2 Vaccination 08/16/2019, 09/15/2019, 03/01/2020  . Pneumococcal Conjugate-13 06/24/2018    TDAP status: Due, Education has been provided regarding the importance of this vaccine. Advised may receive this vaccine at local pharmacy or Health Dept. Aware to provide a copy of the vaccination record if obtained from local pharmacy or Health Dept. Verbalized acceptance and understanding.  Flu Vaccine status: Up to date  Pneumococcal vaccine status: Due, Education has been  provided regarding the importance of this vaccine. Advised may receive this vaccine at local pharmacy or Health Dept. Aware to provide a copy of the vaccination record if obtained from local pharmacy or Health Dept. Verbalized acceptance and understanding.  Covid-19 vaccine status: Completed vaccines  Qualifies for Shingles Vaccine? Yes   Zostavax completed  No   Shingrix Completed?: No.    Education has been provided regarding the importance of this vaccine. Patient has been advised to call insurance company to determine out of pocket expense if they have not yet received this vaccine. Advised may also receive vaccine at local pharmacy or Health Dept. Verbalized acceptance and understanding.  Screening Tests Health Maintenance  Topic Date Due  . TETANUS/TDAP  Never done  . PNA vac Low Risk Adult (2 of 2 - PPSV23) 06/25/2019  . INFLUENZA VACCINE  01/16/2021  . DEXA SCAN  Completed  . COVID-19 Vaccine  Completed  . HPV VACCINES  Aged Out    Health Maintenance  Health Maintenance Due  Topic Date Due  . TETANUS/TDAP  Never done  . PNA vac Low Risk Adult (2 of 2 - PPSV23) 06/25/2019    Colorectal cancer screening: No longer required.   Mammogram status: Ordered today. Pt provided with contact info and advised to call to schedule appt.   Bone Density status: Declined  Lung Cancer Screening: (Low Dose CT Chest recommended if Age 51-80 years, 30 pack-year currently smoking OR have quit w/in 15years.) does not qualify.     Additional Screening:  Hepatitis C Screening: does not qualify  Vision Screening: Recommended annual ophthalmology exams for early detection of glaucoma and other disorders of the eye. Is the patient up to date with their annual eye exam?  Yes  Who is the provider or what is the name of the office in which the patient attends annual eye exams? Dr. Dione Booze   Dental Screening: Recommended annual dental exams for proper oral hygiene  Community Resource Referral /  Chronic Care Management: CRR required this visit?  Yes for social activities  CCM required this visit?  No      Plan:     I have personally reviewed and noted the following in the patient's chart:   . Medical and social history . Use of alcohol, tobacco or illicit drugs  . Current medications and supplements . Functional ability and status . Nutritional status . Physical activity . Advanced directives . List of other physicians . Hospitalizations, surgeries, and ER visits in previous 12 months . Vitals . Screenings to include cognitive, depression, and falls . Referrals and appointments  In addition, I have reviewed and discussed with patient certain preventive protocols, quality metrics, and best practice recommendations. A written personalized care plan for preventive services as well as general preventive health recommendations were provided to patient.     Roanna Raider, LPN   [id number]  Nurse Health Advisor  Nurse Notes: None

## 2020-10-04 ENCOUNTER — Other Ambulatory Visit: Payer: Self-pay

## 2020-10-04 ENCOUNTER — Ambulatory Visit (INDEPENDENT_AMBULATORY_CARE_PROVIDER_SITE_OTHER): Payer: Medicare Other

## 2020-10-04 VITALS — BP 108/64 | HR 62 | Temp 97.2°F | Resp 16 | Ht 66.0 in | Wt 176.4 lb

## 2020-10-04 DIAGNOSIS — Z0000 Encounter for general adult medical examination without abnormal findings: Secondary | ICD-10-CM

## 2020-10-04 DIAGNOSIS — Z1231 Encounter for screening mammogram for malignant neoplasm of breast: Secondary | ICD-10-CM

## 2020-10-04 NOTE — Patient Instructions (Signed)
Tiffany Jennings , Thank you for taking time to come for your Medicare Wellness Visit. I appreciate your ongoing commitment to your health goals. Please review the following plan we discussed and let me know if I can assist you in the future.   Screening recommendations/referrals: Colonoscopy: No longer required Mammogram: Ordered today. Someone will call you to schedule. Bone Density: Due-Declined today. Recommended yearly ophthalmology/optometry visit for glaucoma screening and checkup Recommended yearly dental visit for hygiene and checkup  Vaccinations: Influenza vaccine: Up to date Pneumococcal vaccine: Up to date Tdap vaccine: Discuss with pharmacy Shingles vaccine: Discuss with parmacy   Covid-19:Completed vaccines  Advanced directives: Please bring a copy for your chart  Conditions/risks identified: See problem list  Next appointment: Follow up in one year for your annual wellness visit    Preventive Care 65 Years and Older, Female Preventive care refers to lifestyle choices and visits with your health care provider that can promote health and wellness. What does preventive care include?  A yearly physical exam. This is also called an annual well check.  Dental exams once or twice a year.  Routine eye exams. Ask your health care provider how often you should have your eyes checked.  Personal lifestyle choices, including:  Daily care of your teeth and gums.  Regular physical activity.  Eating a healthy diet.  Avoiding tobacco and drug use.  Limiting alcohol use.  Practicing safe sex.  Taking low-dose aspirin every day.  Taking vitamin and mineral supplements as recommended by your health care provider. What happens during an annual well check? The services and screenings done by your health care provider during your annual well check will depend on your age, overall health, lifestyle risk factors, and family history of disease. Counseling  Your health care  provider may ask you questions about your:  Alcohol use.  Tobacco use.  Drug use.  Emotional well-being.  Home and relationship well-being.  Sexual activity.  Eating habits.  History of falls.  Memory and ability to understand (cognition).  Work and work Astronomer.  Reproductive health. Screening  You may have the following tests or measurements:  Height, weight, and BMI.  Blood pressure.  Lipid and cholesterol levels. These may be checked every 5 years, or more frequently if you are over 34 years old.  Skin check.  Lung cancer screening. You may have this screening every year starting at age 64 if you have a 30-pack-year history of smoking and currently smoke or have quit within the past 15 years.  Fecal occult blood test (FOBT) of the stool. You may have this test every year starting at age 32.  Flexible sigmoidoscopy or colonoscopy. You may have a sigmoidoscopy every 5 years or a colonoscopy every 10 years starting at age 73.  Hepatitis C blood test.  Hepatitis B blood test.  Sexually transmitted disease (STD) testing.  Diabetes screening. This is done by checking your blood sugar (glucose) after you have not eaten for a while (fasting). You may have this done every 1-3 years.  Bone density scan. This is done to screen for osteoporosis. You may have this done starting at age 93.  Mammogram. This may be done every 1-2 years. Talk to your health care provider about how often you should have regular mammograms. Talk with your health care provider about your test results, treatment options, and if necessary, the need for more tests. Vaccines  Your health care provider may recommend certain vaccines, such as:  Influenza vaccine. This is  recommended every year.  Tetanus, diphtheria, and acellular pertussis (Tdap, Td) vaccine. You may need a Td booster every 10 years.  Zoster vaccine. You may need this after age 54.  Pneumococcal 13-valent conjugate (PCV13)  vaccine. One dose is recommended after age 87.  Pneumococcal polysaccharide (PPSV23) vaccine. One dose is recommended after age 61. Talk to your health care provider about which screenings and vaccines you need and how often you need them. This information is not intended to replace advice given to you by your health care provider. Make sure you discuss any questions you have with your health care provider. Document Released: 07/01/2015 Document Revised: 02/22/2016 Document Reviewed: 04/05/2015 Elsevier Interactive Patient Education  2017 [place] Prevention in the Home Falls can cause injuries. They can happen to people of all ages. There are many things you can do to make your home safe and to help prevent falls. What can I do on the outside of my home?  Regularly fix the edges of walkways and driveways and fix any cracks.  Remove anything that might make you trip as you walk through a door, such as a raised step or threshold.  Trim any bushes or trees on the path to your home.  Use bright outdoor lighting.  Clear any walking paths of anything that might make someone trip, such as rocks or tools.  Regularly check to see if handrails are loose or broken. Make sure that both sides of any steps have handrails.  Any raised decks and porches should have guardrails on the edges.  Have any leaves, snow, or ice cleared regularly.  Use sand or salt on walking paths during winter.  Clean up any spills in your garage right away. This includes oil or grease spills. What can I do in the bathroom?  Use night lights.  Install grab bars by the toilet and in the tub and shower. Do not use towel bars as grab bars.  Use non-skid mats or decals in the tub or shower.  If you need to sit down in the shower, use a plastic, non-slip stool.  Keep the floor dry. Clean up any water that spills on the floor as soon as it happens.  Remove soap buildup in the tub or shower  regularly.  Attach bath mats securely with double-sided non-slip rug tape.  Do not have throw rugs and other things on the floor that can make you trip. What can I do in the bedroom?  Use night lights.  Make sure that you have a light by your bed that is easy to reach.  Do not use any sheets or blankets that are too big for your bed. They should not hang down onto the floor.  Have a firm chair that has side arms. You can use this for support while you get dressed.  Do not have throw rugs and other things on the floor that can make you trip. What can I do in the kitchen?  Clean up any spills right away.  Avoid walking on wet floors.  Keep items that you use a lot in easy-to-reach places.  If you need to reach something above you, use a strong step stool that has a grab bar.  Keep electrical cords out of the way.  Do not use floor polish or wax that makes floors slippery. If you must use wax, use non-skid floor wax.  Do not have throw rugs and other things on the floor that can make you trip.  What can I do with my stairs?  Do not leave any items on the stairs.  Make sure that there are handrails on both sides of the stairs and use them. Fix handrails that are broken or loose. Make sure that handrails are as long as the stairways.  Check any carpeting to make sure that it is firmly attached to the stairs. Fix any carpet that is loose or worn.  Avoid having throw rugs at the top or bottom of the stairs. If you do have throw rugs, attach them to the floor with carpet tape.  Make sure that you have a light switch at the top of the stairs and the bottom of the stairs. If you do not have them, ask someone to add them for you. What else can I do to help prevent falls?  Wear shoes that:  Do not have high heels.  Have rubber bottoms.  Are comfortable and fit you well.  Are closed at the toe. Do not wear sandals.  If you use a stepladder:  Make sure that it is fully  opened. Do not climb a closed stepladder.  Make sure that both sides of the stepladder are locked into place.  Ask someone to hold it for you, if possible.  Clearly mark and make sure that you can see:  Any grab bars or handrails.  First and last steps.  Where the edge of each step is.  Use tools that help you move around (mobility aids) if they are needed. These include:  Canes.  Walkers.  Scooters.  Crutches.  Turn on the lights when you go into a dark area. Replace any light bulbs as soon as they burn out.  Set up your furniture so you have a clear path. Avoid moving your furniture around.  If any of your floors are uneven, fix them.  If there are any pets around you, be aware of where they are.  Review your medicines with your doctor. Some medicines can make you feel dizzy. This can increase your chance of falling. Ask your doctor what other things that you can do to help prevent falls. This information is not intended to replace advice given to you by your health care provider. Make sure you discuss any questions you have with your health care provider. Document Released: 03/31/2009 Document Revised: 11/10/2015 Document Reviewed: 07/09/2014 Elsevier Interactive Patient Education  2017 [employer].

## 2020-10-06 ENCOUNTER — Telehealth: Payer: Self-pay | Admitting: Family Medicine

## 2020-10-06 NOTE — Telephone Encounter (Signed)
   Telephone encounter was:  Successful.  10/06/2020 Name: [name] MRN: [id number] DOB: [birth year]  Tiffany Jennings is a 86 y.o. year old female who is a primary care patient of Mliss Sax, MD . The community resource team was consulted for assistance with activity centers for socializing outside of her home  Care guide performed the following interventions: Patient provided with information about care guide support team and interviewed to confirm resource needs Discussed resources to assist with finding activity centers in her area that she can participate in. I emailed her a list of locations in the [place] county area. .  Follow Up Plan:  No further follow up planned at this time. The patient has been provided with needed resources.  Rojelio Brenner Care Guide, Embedded Care Coordination [facility], Care Management Phone: [phone] Email: [email or web address]

## 2020-10-07 DIAGNOSIS — I480 Paroxysmal atrial fibrillation: Secondary | ICD-10-CM | POA: Diagnosis not present

## 2020-10-13 ENCOUNTER — Other Ambulatory Visit: Payer: Self-pay | Admitting: Pulmonary Disease

## 2020-10-14 ENCOUNTER — Telehealth: Payer: Self-pay

## 2020-10-14 ENCOUNTER — Telehealth: Payer: Self-pay | Admitting: Cardiology

## 2020-10-14 NOTE — Telephone Encounter (Signed)
Patient was calling in to get results from her heart monitor. Please advise

## 2020-10-14 NOTE — Progress Notes (Signed)
    Chronic Care Management Pharmacy Assistant   Name: Tiffany Jennings  MRN: [id number] DOB: April 24, 1935  Reason for Encounter: Medication Review/General adherence Call.   Recent office visits:  None ID  Recent consult visits:  09/16/2020 Epifanio Lesches (cardiology)  08/23/2020 Epifanio Lesches (cardiology) -restarted diltiazem 120 mg daily  08/16/2020 Epifanio Lesches (cardiology) - stop Lasix 60 mg, Hold diltiazem 06/27/2020 Epifanio Lesches (cardiology)  Hospital visits:  None in previous 6 months  Medications: Outpatient Encounter Medications as of 10/14/2020  Medication Sig  . albuterol (VENTOLIN HFA) 108 (90 Base) MCG/ACT inhaler INHALE 2 PUFFS INTO THE LUNGS EVERY 6 HOURS AS NEEDED FOR WHEEZING OR SHORTNESS OF BREATH  . atorvastatin (LIPITOR) 20 MG tablet Take 1 tablet (20 mg total) by mouth daily.  . carboxymethylcellulose (REFRESH PLUS) 0.5 % SOLN 1 drop 3 (three) times daily as needed.  . diltiazem (CARDIZEM CD) 120 MG 24 hr capsule Take 1 capsule (120 mg total) by mouth daily.  Marland Kitchen donepezil (ARICEPT) 10 MG tablet TAKE 1 TABLET(10 MG) BY MOUTH AT BEDTIME  . ELIQUIS 5 MG TABS tablet TAKE 1 TABLET(5 MG) BY MOUTH TWICE DAILY  . imipramine (TOFRANIL) 50 MG tablet TAKE 1 TO 2 TABLETS BY MOUTH NIGHTLY  . levothyroxine (SYNTHROID) 50 MCG tablet Take 1 tablet (50 mcg total) by mouth daily before breakfast.  . TRELEGY ELLIPTA 100-62.5-25 MCG/INH AEPB INHALE 1 PUFF INTO THE LUNGS DAILY   No facility-administered encounter medications on file as of 10/14/2020.   Star Rating Drugs: Atorvastatin 20 mg last filled on 10/06/2020 for 90 day supply at [facility].  Called patient and discussed medication adherence  with patient, No issues at this time with current medication.   Patient denies ED visit since her last CPP follow up.  Patient denies any side effects with her medication. Patient denies any problems with her current pharmacy    Everlean Cherry Clinical Pharmacist Assistant [phone]

## 2020-10-14 NOTE — Telephone Encounter (Signed)
Patient aware MD to review and will call back with results.

## 2020-10-15 DIAGNOSIS — J449 Chronic obstructive pulmonary disease, unspecified: Secondary | ICD-10-CM | POA: Diagnosis not present

## 2020-10-24 ENCOUNTER — Telehealth: Payer: Self-pay | Admitting: Cardiology

## 2020-10-24 NOTE — Telephone Encounter (Signed)
Patient called with monitor results

## 2020-10-24 NOTE — Telephone Encounter (Signed)
    Pt is returning call to get heart monitor result 

## 2020-10-24 NOTE — Telephone Encounter (Signed)
Tiffany Ishikawa, MD  10/23/2020 9:44 PM EDT      No further A. fib seen, but frequent PACs and short episodes of SVT. Continue diltiazem

## 2020-11-15 DIAGNOSIS — J449 Chronic obstructive pulmonary disease, unspecified: Secondary | ICD-10-CM | POA: Diagnosis not present

## 2020-11-17 ENCOUNTER — Other Ambulatory Visit: Payer: Self-pay | Admitting: Family Medicine

## 2020-12-06 ENCOUNTER — Ambulatory Visit: Payer: Medicare Other

## 2020-12-12 ENCOUNTER — Other Ambulatory Visit: Payer: Self-pay | Admitting: Family

## 2020-12-15 DIAGNOSIS — J449 Chronic obstructive pulmonary disease, unspecified: Secondary | ICD-10-CM | POA: Diagnosis not present

## 2020-12-20 ENCOUNTER — Other Ambulatory Visit: Payer: Self-pay | Admitting: Cardiology

## 2020-12-26 ENCOUNTER — Telehealth: Payer: Self-pay | Admitting: Pulmonary Disease

## 2020-12-26 NOTE — Telephone Encounter (Signed)
Call returned to patient, confirmed DOB. She reports she has gotten this taken care of. She reports she needed a new mask but has since talked to the DME company.   Nothing further needed at this time.

## 2020-12-27 ENCOUNTER — Telehealth: Payer: Self-pay

## 2020-12-27 NOTE — Telephone Encounter (Signed)
Need to verify patients insurance will cover a cpe or does it need to be an AWV. Austin requested question be sent to he and Kristi O.  Pt wants a call back asap to schedule [phone]

## 2020-12-29 NOTE — Telephone Encounter (Signed)
[employer] MCR will cover CPE with the provider & AWV with health coach/nurse.

## 2021-01-01 NOTE — Progress Notes (Deleted)
Cardiology Office Note:    Date:  01/01/2021   ID:  Tiffany Jennings, DOB Mar 03, 1935, MRN [id number]  PCP:  Mliss Sax, MD  Cardiologist:  None  Electrophysiologist:  None   Referring MD: Mliss Sax   No chief complaint on file.  History of Present Illness:    Tiffany Jennings is a 86 y.o. female with a hx of atrial fibrillation on Eliquis, COPD on oxygen, hypertension, hypothyroidism, hyperlipidemia, DVT/PE who presents for follow-up.  She was initially seen on 03/30/2019 for evaluation of atrial fibrillation.  She had moved to the area from [facility].  Was in hospital for pneumonia several years ago and told she had AF.   She denies any symptoms from atrial fibrillation, and does not think she has had any AF since her hospitalization.  She has been on amiodarone 100 mg daily.  The notes from her PCP in [place] indicate that amiodarone was to be discontinued due to her chronic respiratory disease.  However she continued to take amiodarone.  She underwent a TTE in July 2020, which showed normal EF, mild concentric hypertrophy, moderate diastolic dysfunction, no significant valvular disease reported.  She has been on Eliquis and diltiazem for her AF.  Amiodarone was discontinued at initial clinic visit in 03/30/2019.  Zio patch x14 days on 07/29/2019 showed very frequent PACs (18.1% of beats) with numerous episodes of SVT, longest lasting 7 beats.  No evidence of atrial fibrillation.  Echocardiogram on 08/12/2019 showed normal biventricular function, no significant valvular disease.  Had appointment on 08/16/2020 and noted to have significant orthostatic hypotension.  Her Lasix and diltiazem were discontinued.  Subsequently presented on 3/8 with atrial flutter, rate 150 bpm.  Diltiazem was restarted and repeat EKG on 3/9 showed rates had improved to 80s.  At follow-up visit on 4/1 she was back in sinus rhythm.  Zio patch x7 days on 10/07/2020 showed 2564 episodes of SVT, longest  lasting 15 beats and frequent PACs (10.3% of beats), supraventricular couplets (16.1%), and triplets (6.6%).  Since last clinic visit,   She is accompanied by her daughter. Since her last clinic visit, she has started consistently logging her HR and Ox stat.  She has at home SP O2 that range in the 90s and HR 60-80s. She is still having lightheadedness when changing positions that only occur in the morning that last a few seconds. Her lightheadedness does not occur during the day and at night. She denies any syncope episodes, chest pain or tightness, dyspnea. She is no longer taking Lasix and she reports that she is doing well at this time. She drinks 3-4 bottles of water each day. She notes that she is trying to lose 30 lbs but mainly in her abdominal area. She walks 1 mile and denies having any exertional pain or SOB. She often takes breaks throughout her walks. Sometimes she unintentional skips her morning medications but she tries to be consistent with her night medications.  Wt Readings from Last 3 Encounters:  10/04/20 176 lb 6.4 oz (80 kg)  09/16/20 177 lb (80.3 kg)  08/23/20 177 lb (80.3 kg)     Past Medical History:  Diagnosis Date   A-fib (HCC)    COPD (chronic obstructive pulmonary disease) (HCC)    Hyperlipidemia    Hypertension     Past Surgical History:  Procedure Laterality Date   CAROTID ANGIOGRAM      Current Medications: No outpatient medications have been marked as taking for the 01/04/21 encounter (Appointment)  with Little Ishikawa, MD.     Allergies:   Codeine   Social History   Socioeconomic History   Marital status: Single    Spouse name: Not on file   Number of children: Not on file   Years of education: Not on file   Highest education level: Not on file  Occupational History   Not on file  Tobacco Use   Smoking status: Every Day    Packs/day: 0.25    Years: 50.00    Pack years: 12.50    Types: Cigarettes   Smokeless tobacco: Never    Tobacco comments:    2 cigs a day  Substance and Sexual Activity   Alcohol use: Not Currently   Drug use: Never   Sexual activity: Not on file  Other Topics Concern   Not on file  Social History Narrative   Not on file   Social Determinants of Health   Financial Resource Strain: Low Risk    Difficulty of Paying Living Expenses: Not hard at all  Food Insecurity: No Food Insecurity   Worried About Programme researcher, broadcasting/film/video in the Last Year: Never true   Ran Out of Food in the Last Year: Never true  Transportation Needs: No Transportation Needs   Lack of Transportation (Medical): No   Lack of Transportation (Non-Medical): No  Physical Activity: Sufficiently Active   Days of Exercise per Week: 7 days   Minutes of Exercise per Session: 30 min  Stress: No Stress Concern Present   Feeling of Stress : Not at all  Social Connections: Moderately Isolated   Frequency of Communication with Friends and Family: More than three times a week   Frequency of Social Gatherings with Friends and Family: Once a week   Attends Religious Services: Never   Database administrator or Organizations: Yes   Attends Banker Meetings: 1 to 4 times per year   Marital Status: Widowed     Family History: No relevant family history of heart disease  ROS:   Please see the history of present illness.  (+)Lightheadness    All other systems reviewed and are negative.  EKGs/Labs/Other Studies Reviewed:    The following studies were reviewed today:   EKG:   09/16/20-Sinus with PACs, rate 74 bpm   08/23/20-Atrial flutter, rate 152 bpm  Recent Labs: 06/27/2020: Magnesium 2.2; TSH 2.890 08/17/2020: ALT 18 08/23/2020: BUN 20; Creatinine, Ser 1.06; Hemoglobin 12.5; Platelets 405; Potassium 4.2; Sodium 138  Recent Lipid Panel    Component Value Date/Time   CHOL 177 06/27/2020 1145   TRIG 70 06/27/2020 1145   HDL 82 06/27/2020 1145   CHOLHDL 2.2 06/27/2020 1145   LDLCALC 82 06/27/2020 1145   LDLDIRECT  86 03/06/2019 1142    Physical Exam:    VS:  There were no vitals taken for this visit.    Wt Readings from Last 3 Encounters:  10/04/20 176 lb 6.4 oz (80 kg)  09/16/20 177 lb (80.3 kg)  08/23/20 177 lb (80.3 kg)     GEN:  in no acute distress, on oxygen HEENT: Normal NECK: No JVD CARDIAC: Irregular rhythm, normal rate, no murmurs, rubs, gallops RESPIRATORY: Mild bilateral expiratory wheezing ABDOMEN: Soft, non-tender, non-distended MUSCULOSKELETAL:  No edema; No deformity  SKIN: Warm and dry NEUROLOGIC:  Alert and oriented x 3 PSYCHIATRIC:  Normal affect   ASSESSMENT:    No diagnosis found.  PLAN:    Atrial fibrillation/flutter: On diltiazem 120 mg and Eliquis  5 mg twice daily.  CHADS-VASc 4 (HTN, agex2, female).  TTE from 08/12/19 shows normal biventricular function, no significant valvular disease.  Amiodarone discontinued 03/2019 due to risk of pulmonary toxicity given her COPD.  Zio patch x14 days on 07/29/2019 showed very frequent PACs (18.1% of beats) with numerous episodes of SVT, longest lasting 7 beats.  No evidence of atrial fibrillation.  Had recurrence of atrial flutter on 3/8 while diltiazem was held due to orthostatic hypotension, rates up to 150s.  Rate improved to 80s with restarting diltiazem and she spontaneously converted to sinus rhythm.  Zio patch x7 days on 10/07/2020 showed no Afib but 2564 episodes of SVT, longest lasting 15 beats and frequent PACs (10.3% of beats), supraventricular couplets (16.1%), and triplets (6.6%). -Continue Eliquis 5 mg twice daily -Continue diltiazem 120 mg daily  Orthostatic hypotension: BP dropped from 133/68 lying to 87/46 at clinic appointment on 08/19/20.  She had been taking Lasix 20 mg 3 times weekly, have discontinued.   Diltiazem has been held but was restarted given her atrial flutter with RVR.  Suspect due to Lasix use, has been discontinued.  Appears improved.  Frequent PACs/SVT: Zio patch x14 days on 07/29/2019 showed very  frequent PACs (18.1% of beats) with numerous episodes of SVT, longest lasting 7 beats.  Continue diltiazem.  TSH was low, hypothyroidism may have been contributing to increased ectopy, her levothyroxine dose was decreased.  TSH normal on 06/27/2020.  Zio patch x7 days on 10/07/2020 showed no Afib but 2564 episodes of SVT, longest lasting 15 beats and frequent PACs (10.3% of beats), supraventricular couplets (16.1%), and triplets (6.6%). -Continue diltiazem as above  Chronic diastolic heart failure: Was on Lasix 20 mg 3 times weekly.  Discontinue Lasix due to orthostatic hypotension as above.  Hypertension: Presented with orthostatic hypotension, held diltiazem but restarted for atrial flutter as above  Hyperlipidemia: On atorvastatin 10 mg.  LDL 105 on 12/28/2019, atorvastatin increased to 20 mg daily.  LDL 82 on 06/27/2020  Tobacco use: smoked x 30 years for 1 ppd.  Currently is down to 2-3 cigarettes per day.  Risks of tobacco use were discussed and cessation strongly encouraged  Leg pain: ABIs normal, no evidence of PAD  COPD: on home oxygen.  Follows with pulmonology.  RTC in ***   Medication Adjustments/Labs and Tests Ordered: Current medicines are reviewed at length with the patient today.  Concerns regarding medicines are outlined above.  No orders of the defined types were placed in this encounter.  No orders of the defined types were placed in this encounter.   There are no Patient Instructions on file for this visit.    I,Alexis Bryant,acting as a Neurosurgeon for Little Ishikawa, MD.,have documented all relevant documentation on the behalf of Little Ishikawa, MD,as directed by  Little Ishikawa, MD while in the presence of Little Ishikawa, MD.   Signed, Little Ishikawa, MD  01/01/2021 3:05 PM    [facility]

## 2021-01-02 NOTE — Progress Notes (Signed)
Cardiology Office Note:    Date:  01/04/2021   ID:  Tiffany Jennings, DOB 03/04/1935, MRN [id number]  PCP:  Mliss Sax, MD  Cardiologist:  None  Electrophysiologist:  None   Referring MD: Mliss Sax,*   Chief Complaint  Patient presents with   Follow-up    3 months.   Atrial Flutter    History of Present Illness:    Tiffany Jennings is a 86 y.o. female with a hx of atrial fibrillation on Eliquis, COPD on oxygen, hypertension, hypothyroidism, hyperlipidemia, DVT/PE who presents for follow-up.  She was initially seen on 03/30/2019 for evaluation of atrial fibrillation.  She had moved to the area from [facility].  Was in hospital for pneumonia several years ago and told she had AF.   She denies any symptoms from atrial fibrillation, and does not think she has had any AF since her hospitalization.  She has been on amiodarone 100 mg daily.  The notes from her PCP in [place] indicate that amiodarone was to be discontinued due to her chronic respiratory disease.  However she continued to take amiodarone.  She underwent a TTE in July 2020, which showed normal EF, mild concentric hypertrophy, moderate diastolic dysfunction, no significant valvular disease reported.  She has been on Eliquis and diltiazem for her AF.  Amiodarone was discontinued at initial clinic visit in 03/30/2019.  Zio patch x14 days on 07/29/2019 showed very frequent PACs (18.1% of beats) with numerous episodes of SVT, longest lasting 7 beats.  No evidence of atrial fibrillation.  Echocardiogram on 08/12/2019 showed normal biventricular function, no significant valvular disease.  Had appointment on 08/16/2020 and noted to have significant orthostatic hypotension.  Her Lasix and diltiazem were discontinued.  Subsequently presented on 3/8 with atrial flutter, rate 150 bpm.  Diltiazem was restarted and repeat EKG on 3/9 showed rates had improved to 80s.  At follow-up visit on 4/1 she was back in sinus rhythm.  Zio  patch x7 days on 10/07/2020 showed 2564 episodes of SVT, longest lasting 15 beats and frequent PACs (10.3% of beats), supraventricular couplets (16.1%), and triplets (6.6%).   Since last clinic visit,she is feeling good overall. She experiences occasional lightheadedness. Denies syncope, palpitations, palpitations, chest pains, or blood in stool. She can sit on the porch without her oxygen tank and talk with her grandson without any issues. She use to drink 80 oz of water and currently drinks 30 oz-40 oz. She stopped walking outside because of the hot weather but currently attends a gym class for balance and mobility 4 times a week for an hour. She exercises on a station bike and walk 5 miles on a treadmill. Her SpO2 is >92 when exercising. She smokes 2-3 cigarettes a day.   Wt Readings from Last 3 Encounters:  01/04/21 179 lb (81.2 kg)  10/04/20 176 lb 6.4 oz (80 kg)  09/16/20 177 lb (80.3 kg)     Past Medical History:  Diagnosis Date   A-fib (HCC)    COPD (chronic obstructive pulmonary disease) (HCC)    Hyperlipidemia    Hypertension     Past Surgical History:  Procedure Laterality Date   CAROTID ANGIOGRAM      Current Medications: Current Meds  Medication Sig   albuterol (VENTOLIN HFA) 108 (90 Base) MCG/ACT inhaler INHALE 2 PUFFS INTO THE LUNGS EVERY 6 HOURS AS NEEDED FOR WHEEZING OR SHORTNESS OF BREATH   atorvastatin (LIPITOR) 20 MG tablet Take 1 tablet (20 mg total) by mouth daily.  carboxymethylcellulose (REFRESH PLUS) 0.5 % SOLN 1 drop 3 (three) times daily as needed.   diltiazem (CARDIZEM CD) 120 MG 24 hr capsule TAKE 1 CAPSULE(120 MG) BY MOUTH DAILY   donepezil (ARICEPT) 10 MG tablet TAKE 1 TABLET(10 MG) BY MOUTH AT BEDTIME   ELIQUIS 5 MG TABS tablet TAKE 1 TABLET(5 MG) BY MOUTH TWICE DAILY   imipramine (TOFRANIL) 50 MG tablet TAKE 1 TO 2 TABLETS BY MOUTH NIGHTLY   levothyroxine (SYNTHROID) 50 MCG tablet Take 1 tablet (50 mcg total) by mouth daily before breakfast.    TRELEGY ELLIPTA 100-62.5-25 MCG/INH AEPB INHALE 1 PUFF INTO THE LUNGS DAILY     Allergies:   Codeine   Social History   Socioeconomic History   Marital status: Single    Spouse name: Not on file   Number of children: Not on file   Years of education: Not on file   Highest education level: Not on file  Occupational History   Not on file  Tobacco Use   Smoking status: Every Day    Packs/day: 0.25    Years: 50.00    Pack years: 12.50    Types: Cigarettes   Smokeless tobacco: Never   Tobacco comments:    2 cigs a day  Substance and Sexual Activity   Alcohol use: Not Currently   Drug use: Never   Sexual activity: Not on file  Other Topics Concern   Not on file  Social History Narrative   Not on file   Social Determinants of Health   Financial Resource Strain: Low Risk    Difficulty of Paying Living Expenses: Not hard at all  Food Insecurity: No Food Insecurity   Worried About Programme researcher, broadcasting/film/videounning Out of Food in the Last Year: Never true   Ran Out of Food in the Last Year: Never true  Transportation Needs: No Transportation Needs   Lack of Transportation (Medical): No   Lack of Transportation (Non-Medical): No  Physical Activity: Sufficiently Active   Days of Exercise per Week: 7 days   Minutes of Exercise per Session: 30 min  Stress: No Stress Concern Present   Feeling of Stress : Not at all  Social Connections: Moderately Isolated   Frequency of Communication with Friends and Family: More than three times a week   Frequency of Social Gatherings with Friends and Family: Once a week   Attends Religious Services: Never   Database administratorActive Member of Clubs or Organizations: Yes   Attends BankerClub or Organization Meetings: 1 to 4 times per year   Marital Status: Widowed     Family History: No relevant family history of heart disease  ROS:   Please see the history of present illness.  (+) Shortness of breath  All other systems reviewed and are negative.  EKGs/Labs/Other Studies Reviewed:     The following studies were reviewed today:  ECHO 08/12/2019:   IMPRESSIONS:  1. Left ventricular ejection fraction, by estimation, is 55 to 60%. The  left ventricle has normal function. The left ventricle has no regional  wall motion abnormalities. There is mild concentric left ventricular  hypertrophy. Left ventricular diastolic  function could not be evaluated.   2. Right ventricular systolic function is normal. The right ventricular  size is normal. Tricuspid regurgitation signal is inadequate for assessing  PA pressure.   3. The mitral valve is normal in structure and function. Trivial mitral  valve regurgitation. No evidence of mitral stenosis.   4. The aortic valve is tricuspid. Aortic valve regurgitation  is not  visualized. No aortic stenosis is present.   5. The inferior vena cava is normal in size with greater than 50%  respiratory variability, suggesting right atrial pressure of 3 mmHg.   10/07/20: LONG TERM MONITOR  Study Highlights   Very frequent PACs (18.1% of beats), with numerous episodes of SVT (longest lasting 7 beats) No atrial fibrillation   14 days of data recorded on Zio monitor. Patient had a min HR of 50 bpm, max HR of 176 bpm, and avg HR of 68 bpm. Predominant underlying rhythm was Sinus Rhythm. No VT, atrial fibrillation, high degree block, or pauses noted. 926 episodes of SVT, longest lasting 7 beats.  Isolated ventricular ectopy was rare (<1%). Very frequent PACs (18.1% of beats).  There were 7 triggered events, which corresponded to sinus rhythm plus PACs.     04/13/2019: vas [place] le artery seg  Summary:  Right: Resting right ankle-brachial index is within normal range. No  evidence of significant right lower extremity arterial disease. The right  toe-brachial index is normal.   Left: Resting left ankle-brachial index is within normal range. No  evidence of significant left lower extremity arterial disease. The left  toe-brachial index is abnormal.    EKG:   01/04/21:sinus rhythm with PACs rate 78 bpm,  09/16/20-Sinus with PACs, rate 74 bpm   08/23/20-Atrial flutter, rate 152 bpm  Recent Labs: 06/27/2020: Magnesium 2.2; TSH 2.890 08/17/2020: ALT 18 08/23/2020: BUN 20; Creatinine, Ser 1.06; Hemoglobin 12.5; Platelets 405; Potassium 4.2; Sodium 138  Recent Lipid Panel    Component Value Date/Time   CHOL 177 06/27/2020 1145   TRIG 70 06/27/2020 1145   HDL 82 06/27/2020 1145   CHOLHDL 2.2 06/27/2020 1145   LDLCALC 82 06/27/2020 1145   LDLDIRECT 86 03/06/2019 1142    Physical Exam:    VS:  BP (!) 110/50 (BP Location: Left Arm, Patient Position: Sitting, Cuff Size: Normal)   Pulse 78   Ht 5\' 6"  (1.676 m)   Wt 179 lb (81.2 kg)   BMI 28.89 kg/m     Wt Readings from Last 3 Encounters:  01/04/21 179 lb (81.2 kg)  10/04/20 176 lb 6.4 oz (80 kg)  09/16/20 177 lb (80.3 kg)     GEN:  in no acute distress, on oxygen HEENT: Normal NECK: No JVD CARDIAC: Irregular rhythm, normal rate, no murmurs, rubs, gallops RESPIRATORY: no wheezing ABDOMEN: Soft, non-tender, non-distended MUSCULOSKELETAL:  No edema; No deformity  SKIN: Warm and dry NEUROLOGIC:  Alert and oriented x 3 PSYCHIATRIC:  Normal affect   ASSESSMENT:    1. Paroxysmal atrial fibrillation (HCC)   2. Orthostatic hypotension   3. SVT (supraventricular tachycardia) (HCC)   4. Chronic diastolic heart failure (HCC)   5. Tobacco use     PLAN:    Atrial fibrillation/flutter: On diltiazem 120 mg and Eliquis 5 mg twice daily.  CHADS-VASc 4 (HTN, agex2, female).  TTE from 08/12/19 shows normal biventricular function, no significant valvular disease.  Amiodarone discontinued 03/2019 due to risk of pulmonary toxicity given her COPD.  Zio patch x14 days on 07/29/2019 showed very frequent PACs (18.1% of beats) with numerous episodes of SVT, longest lasting 7 beats.  No evidence of atrial fibrillation.  Had recurrence of atrial flutter on 3/8 while diltiazem was held due to orthostatic  hypotension, rates up to 150s.  Rate improved to 80s with restarting diltiazem and she spontaneously converted to sinus rhythm.  Zio patch x7 days on 10/07/2020 showed no Afib  but 2564 episodes of SVT, longest lasting 15 beats and frequent PACs (10.3% of beats), supraventricular couplets (16.1%), and triplets (6.6%). -Continue Eliquis 5 mg twice daily -Continue diltiazem 120 mg daily -Check echo  Orthostatic hypotension: BP dropped from 133/68 lying to 87/46 at clinic appointment on 08/19/20.  She had been taking Lasix 20 mg 3 times weekly, have discontinued.   Diltiazem has been held but was restarted given her atrial flutter with RVR.  Suspect due to Lasix use, has been discontinued.  Appears improved.  Advised to stay hydrated and stand slowly  Frequent PACs/SVT: Zio patch x14 days on 07/29/2019 showed very frequent PACs (18.1% of beats) with numerous episodes of SVT, longest lasting 7 beats.  Continue diltiazem.  TSH was low, hypothyroidism may have been contributing to increased ectopy, her levothyroxine dose was decreased.  TSH normal on 06/27/2020.  Zio patch x7 days on 10/07/2020 showed no Afib but 2564 episodes of SVT, longest lasting 15 beats and frequent PACs (10.3% of beats), supraventricular couplets (16.1%), and triplets (6.6%). -Continue diltiazem as above  Chronic diastolic heart failure: Was on Lasix 20 mg 3 times weekly.  Discontinued Lasix due to orthostatic hypotension as above.  Hypertension: Presented with orthostatic hypotension, held diltiazem but restarted for atrial flutter as above.  Appears controlled.  Hyperlipidemia: On atorvastatin 10 mg.  LDL 105 on 12/28/2019, atorvastatin increased to 20 mg daily.  LDL 82 on 06/27/2020  Tobacco use: smoked x 30 years for 1 ppd.  Currently is down to 2-3 cigarettes per day.  Risks of tobacco use were discussed and cessation strongly encouraged  Leg pain: ABIs normal, no evidence of PAD  COPD: on home oxygen.  Follows with  pulmonology.  RTC in 6 months   Medication Adjustments/Labs and Tests Ordered: Current medicines are reviewed at length with the patient today.  Concerns regarding medicines are outlined above.  Orders Placed This Encounter  Procedures   EKG 12-Lead   ECHOCARDIOGRAM COMPLETE    No orders of the defined types were placed in this encounter.   Patient Instructions  Medication Instructions:  Your physician recommends that you continue on your current medications as directed. Please refer to the Current Medication list given to you today.  *If you need a refill on your cardiac medications before your next appointment, please call your pharmacy*  Testing/Procedures: Your physician has requested that you have an echocardiogram. Echocardiography is a painless test that uses sound waves to create images of your heart. It provides your doctor with information about the size and shape of your heart and how well your heart's chambers and valves are working. This procedure takes approximately one hour. There are no restrictions for this procedure. This will be done at our [facility] location:  [address]  Follow-Up: At [facility], you and your health needs are our priority.  As part of our continuing mission to provide you with exceptional heart care, we have created designated Provider Care Teams.  These Care Teams include your primary Cardiologist (physician) and Advanced Practice Providers (APPs -  Physician Assistants and Nurse Practitioners) who all work together to provide you with the care you need, when you need it.  We recommend signing up for the patient portal called "MyChart".  Sign up information is provided on this After Visit Summary.  MyChart is used to connect with patients for Virtual Visits (Telemedicine).  Patients are able to view lab/test results, encounter notes, upcoming appointments, etc.  Non-urgent messages  can be sent to your provider as well.    To learn more about what you can do with MyChart, go to [email or web address].    Your next appointment:   6 month(s)  The format for your next appointment:   In Person  Provider:   Epifanio Lesches, MD      Liz Beach as a scribe for Little Ishikawa, MD.,have documented all relevant documentation on the behalf of Little Ishikawa, MD,as directed by  Little Ishikawa, MD while in the presence of Little Ishikawa, MD.  I, Little Ishikawa, MD, have reviewed all documentation for this visit. The documentation on 01/04/21 for the exam, diagnosis, procedures, and orders are all accurate and complete.   Signed, Little Ishikawa, MD  01/04/2021 11:55 AM    [facility]

## 2021-01-04 ENCOUNTER — Ambulatory Visit: Payer: Medicare Other | Admitting: Cardiology

## 2021-01-04 ENCOUNTER — Other Ambulatory Visit: Payer: Self-pay

## 2021-01-04 ENCOUNTER — Encounter: Payer: Self-pay | Admitting: Cardiology

## 2021-01-04 VITALS — BP 110/50 | HR 78 | Ht 66.0 in | Wt 179.0 lb

## 2021-01-04 DIAGNOSIS — I5032 Chronic diastolic (congestive) heart failure: Secondary | ICD-10-CM

## 2021-01-04 DIAGNOSIS — Z720 Tobacco use: Secondary | ICD-10-CM | POA: Diagnosis not present

## 2021-01-04 DIAGNOSIS — I951 Orthostatic hypotension: Secondary | ICD-10-CM | POA: Diagnosis not present

## 2021-01-04 DIAGNOSIS — I480 Paroxysmal atrial fibrillation: Secondary | ICD-10-CM | POA: Diagnosis not present

## 2021-01-04 DIAGNOSIS — I471 Supraventricular tachycardia: Secondary | ICD-10-CM

## 2021-01-04 NOTE — Patient Instructions (Signed)
Medication Instructions:  Your physician recommends that you continue on your current medications as directed. Please refer to the Current Medication list given to you today.  *If you need a refill on your cardiac medications before your next appointment, please call your pharmacy*  Testing/Procedures: Your physician has requested that you have an echocardiogram. Echocardiography is a painless test that uses sound waves to create images of your heart. It provides your doctor with information about the size and shape of your heart and how well your heart's chambers and valves are working. This procedure takes approximately one hour. There are no restrictions for this procedure.  This will be done at our [address] location:  [address]  Follow-Up: At [facility], you and your health needs are our priority.  As part of our continuing mission to provide you with exceptional heart care, we have created designated Provider Care Teams.  These Care Teams include your primary Cardiologist (physician) and Advanced Practice Providers (APPs -  Physician Assistants and Nurse Practitioners) who all work together to provide you with the care you need, when you need it.  We recommend signing up for the patient portal called "MyChart".  Sign up information is provided on this After Visit Summary.  MyChart is used to connect with patients for Virtual Visits (Telemedicine).  Patients are able to view lab/test results, encounter notes, upcoming appointments, etc.  Non-urgent messages can be sent to your provider as well.   To learn more about what you can do with MyChart, go to [email or web address].    Your next appointment:   6 month(s)  The format for your next appointment:   In Person  Provider:   Christopher Schumann, MD     

## 2021-01-05 ENCOUNTER — Other Ambulatory Visit: Payer: Self-pay | Admitting: Cardiology

## 2021-01-15 DIAGNOSIS — J449 Chronic obstructive pulmonary disease, unspecified: Secondary | ICD-10-CM | POA: Diagnosis not present

## 2021-01-21 ENCOUNTER — Other Ambulatory Visit: Payer: Self-pay | Admitting: Family

## 2021-01-21 DIAGNOSIS — F339 Major depressive disorder, recurrent, unspecified: Secondary | ICD-10-CM

## 2021-01-25 ENCOUNTER — Telehealth: Payer: Self-pay

## 2021-01-25 NOTE — Progress Notes (Signed)
Chronic Care Management Pharmacy Assistant   Name: Angalina Ante  MRN: [id number] DOB: 1935-06-18  Reason for Encounter:Hypertension Disease State Call.   Recent office visits:  No recent Office Visit  Recent consult visits:  01/04/2021 Dr.Schumann MD (Cardiology) Follow up with Cardiology in 6 months.  Hospital visits:  None in previous 6 months  Medications: Outpatient Encounter Medications as of 01/25/2021  Medication Sig   albuterol (VENTOLIN HFA) 108 (90 Base) MCG/ACT inhaler INHALE 2 PUFFS INTO THE LUNGS EVERY 6 HOURS AS NEEDED FOR WHEEZING OR SHORTNESS OF BREATH   atorvastatin (LIPITOR) 20 MG tablet TAKE 1 TABLET(20 MG) BY MOUTH DAILY   carboxymethylcellulose (REFRESH PLUS) 0.5 % SOLN 1 drop 3 (three) times daily as needed.   diltiazem (CARDIZEM CD) 120 MG 24 hr capsule TAKE 1 CAPSULE(120 MG) BY MOUTH DAILY   donepezil (ARICEPT) 10 MG tablet TAKE 1 TABLET(10 MG) BY MOUTH AT BEDTIME   ELIQUIS 5 MG TABS tablet TAKE 1 TABLET(5 MG) BY MOUTH TWICE DAILY   imipramine (TOFRANIL) 50 MG tablet TAKE 1 TO 2 TABLETS BY MOUTH NIGHTLY   levothyroxine (SYNTHROID) 50 MCG tablet Take 1 tablet (50 mcg total) by mouth daily before breakfast.   TRELEGY ELLIPTA 100-62.5-25 MCG/INH AEPB INHALE 1 PUFF INTO THE LUNGS DAILY   No facility-administered encounter medications on file as of 01/25/2021.    Care Gaps: Urine Microalbumin Tetanus Vaccine Shingrix Vaccine PNA Vaccine COVID-19 Vaccine Influenza Vaccine Star Rating Drugs: Atorvastatin 20 mg last filled 01/05/2021 for 90 day supply at [facility]. Medication Fill Gaps: N/A  Reviewed chart prior to disease state call. Spoke with patient regarding BP  Recent Office Vitals: BP Readings from Last 3 Encounters:  01/04/21 (!) 110/50  10/04/20 108/64  09/16/20 128/60   Pulse Readings from Last 3 Encounters:  01/04/21 78  10/04/20 62  09/16/20 74    Wt Readings from Last 3 Encounters:  01/04/21 179 lb (81.2 kg)   10/04/20 176 lb 6.4 oz (80 kg)  09/16/20 177 lb (80.3 kg)     Kidney Function Lab Results  Component Value Date/Time   CREATININE 1.06 (H) 08/23/2020 10:14 AM   CREATININE 0.98 08/17/2020 02:20 PM   CREATININE 1.38 (H) 03/06/2019 11:42 AM   GFR 33.20 (L) 01/07/2020 04:12 PM   GFRNONAA 46 (L) 06/27/2020 11:45 AM   GFRAA 52 (L) 06/27/2020 11:45 AM    BMP Latest Ref Rng & Units 08/23/2020 08/17/2020 06/27/2020  Glucose 65 - 99 mg/dL 160(F) 90 99  BUN 8 - 27 mg/dL 20 16 17   Creatinine 0.57 - 1.00 mg/dL ) 0.93(A 3.55)  BUN/Creat Ratio 12 - 28 19 16 15   Sodium 134 - 144 mmol/L 138 144 140  Potassium 3.5 - 5.2 mmol/L 4.2 4.5 5.0  Chloride 96 - 106 mmol/L 101 105 104  CO2 20 - 29 mmol/L 22 24 24   Calcium 8.7 - 10.3 mg/dL 9.4 9.3 9.5    Current antihypertensive regimen:  Diltiazem 120 mg 1 capsule daily  Patient denies headaches,dizziness,lightheadedness or any side effects from Diltiazem. How often are you checking your Blood Pressure? infrequently Current home BP readings:  Patient reports her blood pressure ranges around 120/80. What recent interventions/DTPs have been made by any provider to improve Blood Pressure control since last CPP Visit: None ID Any recent hospitalizations or ED visits since last visit with CPP? No What diet changes have been made to improve Blood Pressure Control?  Patient states she does not add salt to her meals.  What exercise is being done to improve your Blood Pressure Control?  Patient reports she does exercise daily.  Adherence Review: Is the patient currently on ACE/ARB medication? No Does the patient have >5 day gap between last estimated fill dates? No  Everlean Cherry Clinical Lobbyist [phone]

## 2021-01-26 ENCOUNTER — Other Ambulatory Visit: Payer: Self-pay

## 2021-01-26 ENCOUNTER — Ambulatory Visit (HOSPITAL_COMMUNITY): Payer: Medicare Other | Attending: Cardiology

## 2021-01-26 DIAGNOSIS — I480 Paroxysmal atrial fibrillation: Secondary | ICD-10-CM | POA: Diagnosis not present

## 2021-01-26 LAB — ECHOCARDIOGRAM COMPLETE
Area-P 1/2: 2.62 cm2
S' Lateral: 3.4 cm

## 2021-01-27 ENCOUNTER — Ambulatory Visit
Admission: RE | Admit: 2021-01-27 | Discharge: 2021-01-27 | Disposition: A | Discharge status: Home / Self Care | Payer: Medicare Other | Source: Ambulatory Visit | Attending: Family Medicine | Admitting: Family Medicine

## 2021-01-27 DIAGNOSIS — Z1231 Encounter for screening mammogram for malignant neoplasm of breast: Secondary | ICD-10-CM

## 2021-02-06 ENCOUNTER — Encounter: Payer: Self-pay | Admitting: Family Medicine

## 2021-02-06 ENCOUNTER — Other Ambulatory Visit: Payer: Self-pay

## 2021-02-06 ENCOUNTER — Ambulatory Visit (INDEPENDENT_AMBULATORY_CARE_PROVIDER_SITE_OTHER): Payer: Medicare Other | Admitting: Family Medicine

## 2021-02-06 ENCOUNTER — Other Ambulatory Visit: Payer: Self-pay | Admitting: Family Medicine

## 2021-02-06 VITALS — BP 122/68 | HR 88 | Temp 98.5°F | Ht 66.0 in | Wt 179.0 lb

## 2021-02-06 DIAGNOSIS — E7800 Pure hypercholesterolemia, unspecified: Secondary | ICD-10-CM

## 2021-02-06 DIAGNOSIS — Z720 Tobacco use: Secondary | ICD-10-CM | POA: Diagnosis not present

## 2021-02-06 DIAGNOSIS — Z9981 Dependence on supplemental oxygen: Secondary | ICD-10-CM | POA: Diagnosis not present

## 2021-02-06 DIAGNOSIS — I10 Essential (primary) hypertension: Secondary | ICD-10-CM | POA: Diagnosis not present

## 2021-02-06 DIAGNOSIS — F339 Major depressive disorder, recurrent, unspecified: Secondary | ICD-10-CM

## 2021-02-06 DIAGNOSIS — E039 Hypothyroidism, unspecified: Secondary | ICD-10-CM | POA: Diagnosis not present

## 2021-02-06 LAB — URINALYSIS, ROUTINE W REFLEX MICROSCOPIC
Bilirubin Urine: NEGATIVE
Hgb urine dipstick: NEGATIVE
Ketones, ur: NEGATIVE
Nitrite: NEGATIVE
RBC / HPF: NONE SEEN (ref 0–?)
Specific Gravity, Urine: 1.01 (ref 1.000–1.030)
Total Protein, Urine: NEGATIVE
Urine Glucose: NEGATIVE
Urobilinogen, UA: 0.2 (ref 0.0–1.0)
pH: 6.5 (ref 5.0–8.0)

## 2021-02-06 LAB — LIPID PANEL
Cholesterol: 158 mg/dL (ref 0–200)
HDL: 83.2 mg/dL (ref >39.00)
LDL Cholesterol: 62 mg/dL (ref 0–99)
NonHDL: 75.25
Total CHOL/HDL Ratio: 2
Triglycerides: 67 mg/dL (ref 0.0–149.0)
VLDL: 13.4 mg/dL (ref 0.0–40.0)

## 2021-02-06 LAB — COMPREHENSIVE METABOLIC PANEL
ALT: 18 U/L (ref 0–35)
AST: 17 U/L (ref 0–37)
Albumin: 4.1 g/dL (ref 3.5–5.2)
Alkaline Phosphatase: 113 U/L (ref 39–117)
BUN: 21 mg/dL (ref 6–23)
CO2: 29 mEq/L (ref 19–32)
Calcium: 9.7 mg/dL (ref 8.4–10.5)
Chloride: 102 mEq/L (ref 96–112)
Creatinine, Ser: 0.95 mg/dL (ref 0.40–1.20)
GFR: 55.35 mL/min — ABNORMAL LOW (ref >60.00)
Glucose, Bld: 108 mg/dL — ABNORMAL HIGH (ref 70–99)
Potassium: 4.4 mEq/L (ref 3.5–5.1)
Sodium: 140 mEq/L (ref 135–145)
Total Bilirubin: 0.4 mg/dL (ref 0.2–1.2)
Total Protein: 6.9 g/dL (ref 6.0–8.3)

## 2021-02-06 LAB — CBC
HCT: 38 % (ref 36.0–46.0)
Hemoglobin: 12.1 g/dL (ref 12.0–15.0)
MCHC: 32 g/dL (ref 30.0–36.0)
MCV: 86.5 fl (ref 78.0–100.0)
Platelets: 375 10*3/uL (ref 150.0–400.0)
RBC: 4.39 Mil/uL (ref 3.87–5.11)
RDW: 14.9 % (ref 11.5–15.5)
WBC: 10.5 10*3/uL (ref 4.0–10.5)

## 2021-02-06 LAB — TSH: TSH: 1.95 u[IU]/mL (ref 0.35–5.50)

## 2021-02-06 MED ORDER — IMIPRAMINE PAMOATE 100 MG PO CAPS: 100.0000 mg | ORAL_CAPSULE | Freq: Every day | ORAL | 3 refills | Status: DC

## 2021-02-06 NOTE — Progress Notes (Signed)
Established Patient Office Visit  Subjective:  Patient ID: Tiffany Jennings, female    DOB: [birth year]  Age: 86 y.o. MRN: [id number]  CC:  Chief Complaint  Patient presents with   Annual Exam    CPE,      HPI Tiffany Jennings presents for follow-up of depression, hypothyroidism, hypertension and tobacco abuse.  Accompanied by her daughter today.  She has been taking 100 mg of imipramine nightly.  Blood pressure is controlled with diltiazem.  Continues levothyroxine 50 mcg daily.  [facility]  Continues with atorvastatin for cholesterol.  She had a cup of black coffee and a piece of toast this morning.  Continues follow-up with cardiology for A. fib.  Continues to smoke 1 to 2 cigarettes daily.  COPD treatment with oxygen therapy Trelegy and as needed albuterol per pulmonology  Past Medical History:  Diagnosis Date   A-fib ([place])    COPD (chronic obstructive pulmonary disease) ([place])    Hyperlipidemia    Hypertension     Past Surgical History:  Procedure Laterality Date   CAROTID ANGIOGRAM      No family history on file.  Social History   Socioeconomic History   Marital status: Single    Spouse name: Not on file   Number of children: Not on file   Years of education: Not on file   Highest education level: Not on file  Occupational History   Not on file  Tobacco Use   Smoking status: Every Day    Packs/day: 0.25    Years: 50.00    Pack years: 12.50    Types: Cigarettes   Smokeless tobacco: Never   Tobacco comments:    2 cigs a day  Substance and Sexual Activity   Alcohol use: Not Currently   Drug use: Never   Sexual activity: Not on file  Other Topics Concern   Not on file  Social History Narrative   Not on file   Social Determinants of Health   Financial Resource Strain: Not on file  Food Insecurity: No Food Insecurity   Worried About Running Out of Food in the Last Year: Never true   Ran Out of Food in the Last Year:  Never true  Transportation Needs: Not on file  Physical Activity: Sufficiently Active   Days of Exercise per Week: 7 days   Minutes of Exercise per Session: 30 min  Stress: No Stress Concern Present   Feeling of Stress : Not at all  Social Connections: Moderately Isolated   Frequency of Communication with Friends and Family: More than three times a week   Frequency of Social Gatherings with Friends and Family: Once a week   Attends Religious Services: Never   Marine scientist or Organizations: Yes   Attends Archivist Meetings: 1 to 4 times per year   Marital Status: Widowed  Human resources officer Violence: Not At Risk   Fear of Current or Ex-Partner: No   Emotionally Abused: No   Physically Abused: No   Sexually Abused: No    Outpatient Medications Prior to Visit  Medication Sig Dispense Refill   albuterol (VENTOLIN HFA) 108 (90 Base) MCG/ACT inhaler INHALE 2 PUFFS INTO THE LUNGS EVERY 6 HOURS AS NEEDED FOR WHEEZING OR SHORTNESS OF BREATH 18 g 1   atorvastatin (LIPITOR) 20 MG tablet TAKE 1 TABLET(20 MG) BY MOUTH DAILY 90 tablet 3   carboxymethylcellulose (REFRESH PLUS) 0.5 % SOLN 1 drop 3 (three)  times daily as needed.     diltiazem (CARDIZEM CD) 120 MG 24 hr capsule TAKE 1 CAPSULE(120 MG) BY MOUTH DAILY 90 capsule 3   donepezil (ARICEPT) 10 MG tablet TAKE 1 TABLET(10 MG) BY MOUTH AT BEDTIME 90 tablet 1   ELIQUIS 5 MG TABS tablet TAKE 1 TABLET(5 MG) BY MOUTH TWICE DAILY 60 tablet 5   levothyroxine (SYNTHROID) 50 MCG tablet Take 1 tablet (50 mcg total) by mouth daily before breakfast. 90 tablet 4   TRELEGY ELLIPTA 100-62.5-25 MCG/INH AEPB INHALE 1 PUFF INTO THE LUNGS DAILY 60 each 3   imipramine (TOFRANIL) 50 MG tablet TAKE 1 TO 2 TABLETS BY MOUTH NIGHTLY 60 tablet 5   No facility-administered medications prior to visit.    Allergies  Allergen Reactions   Codeine     ROS Review of Systems  Constitutional:  Negative for diaphoresis, fatigue, fever and unexpected  weight change.  HENT: Negative.    Eyes:  Negative for photophobia and visual disturbance.  Respiratory: Negative.    Cardiovascular: Negative.   Gastrointestinal: Negative.   Endocrine: Negative for polyphagia and polyuria.  Genitourinary: Negative.  Negative for difficulty urinating, dysuria, hematuria and urgency.  Musculoskeletal:  Negative for back pain and gait problem.  Allergic/Immunologic: Negative for immunocompromised state.  Neurological:  Negative for speech difficulty.  Hematological:  Does not bruise/bleed easily.  Psychiatric/Behavioral: Negative.       Objective:    Physical Exam Vitals and nursing note reviewed.  Constitutional:      General: She is not in acute distress.    Appearance: Normal appearance. She is not ill-appearing, toxic-appearing or diaphoretic.  HENT:     Head: Normocephalic and atraumatic.     Right Ear: Tympanic membrane, ear canal and external ear normal.     Left Ear: Tympanic membrane, ear canal and external ear normal.     Mouth/Throat:     Mouth: Mucous membranes are dry.     Pharynx: Oropharynx is clear. No oropharyngeal exudate or posterior oropharyngeal erythema.  Eyes:     General: No scleral icterus.       Right eye: No discharge.        Left eye: No discharge.     Extraocular Movements: Extraocular movements intact.     Conjunctiva/sclera: Conjunctivae normal.     Pupils: Pupils are equal, round, and reactive to light.  Neck:     Vascular: No carotid bruit.  Cardiovascular:     Rate and Rhythm: Normal rate. Rhythm regularly irregular.  Pulmonary:     Effort: Pulmonary effort is normal.     Breath sounds: Normal breath sounds. Decreased air movement present.  Musculoskeletal:     Cervical back: No rigidity or tenderness.     Right lower leg: No edema.     Left lower leg: No edema.  Lymphadenopathy:     Cervical: No cervical adenopathy.  Skin:    General: Skin is warm and dry.  Neurological:     Mental Status: She is  alert and oriented to person, place, and time.  Psychiatric:        Behavior: Behavior normal.    BP 122/68 (BP Location: Left Arm, Patient Position: Sitting, Cuff Size: Normal)   Pulse 88   Temp 98.5 F (36.9 C) (Oral)   Ht _0  (1.676 m)   Wt 179 lb (81.2 kg)   SpO2 96%   BMI 28.89 kg/m  Wt Readings from Last 3 Encounters:  02/06/21 179 lb (81.2 kg)  01/04/21 179 lb (81.2 kg)  10/04/20 176 lb 6.4 oz (80 kg)     Health Maintenance Due  Topic Date Due   URINE MICROALBUMIN  Never done   TETANUS/TDAP  Never done   Zoster Vaccines- Shingrix (1 of 2) Never done   PNA vac Low Risk Adult (2 of 2 - PPSV23) 06/25/2019   INFLUENZA VACCINE  01/16/2021    There are no preventive care reminders to display for this patient.  Lab Results  Component Value Date   TSH 2.890 06/27/2020   Lab Results  Component Value Date   WBC 12.4 (H) 08/23/2020   HGB 12.5 08/23/2020   HCT 38.0 08/23/2020   MCV 89 08/23/2020   PLT 405 08/23/2020   Lab Results  Component Value Date   NA 138 08/23/2020   K 4.2 08/23/2020   CO2 22 08/23/2020   GLUCOSE 109 (H) 08/23/2020   BUN 20 08/23/2020   CREATININE 1.06 (H) 08/23/2020   BILITOT 0.2 08/17/2020   ALKPHOS 151 (H) 08/17/2020   AST 15 08/17/2020   ALT 18 08/17/2020   PROT 6.5 08/17/2020   ALBUMIN 4.0 08/17/2020   CALCIUM 9.4 08/23/2020   EGFR 52 (L) 08/23/2020   GFR 33.20 (L) 01/07/2020   Lab Results  Component Value Date   CHOL 177 06/27/2020   Lab Results  Component Value Date   HDL 82 06/27/2020   Lab Results  Component Value Date   LDLCALC 82 06/27/2020   Lab Results  Component Value Date   TRIG 70 06/27/2020   Lab Results  Component Value Date   CHOLHDL 2.2 06/27/2020   Lab Results  Component Value Date   HGBA1C 6.3 06/22/2019      Assessment & Plan:   Problem List Items Addressed This Visit       Cardiovascular and Mediastinum   Essential hypertension   Relevant Orders   CBC   Comprehensive metabolic  panel   Urinalysis, Routine w reflex microscopic     Endocrine   Hypothyroidism - Primary   Relevant Orders   TSH     Other   Depression, recurrent (HCC)   Relevant Medications   imipramine (TOFRANIL-PM) 100 MG capsule   Elevated cholesterol   Relevant Orders   Comprehensive metabolic panel   Lipid panel   On supplemental oxygen therapy   Tobacco abuse    Meds ordered this encounter  Medications   imipramine (TOFRANIL-PM) 100 MG capsule    Sig: Take 1 capsule (100 mg total) by mouth at bedtime.    Dispense:  90 capsule    Refill:  3     Follow-up: No follow-ups on file.  Given information on the Shingrix vaccine she will have that done through her pharmacy.  Advised Debrox drops for earwax.  Asked her to increase her level of hydration.  Continue all medicines as above.  Libby Maw, MD

## 2021-02-08 ENCOUNTER — Encounter: Payer: Self-pay | Admitting: *Deleted

## 2021-02-15 ENCOUNTER — Encounter: Payer: Self-pay | Admitting: Pulmonary Disease

## 2021-02-15 ENCOUNTER — Other Ambulatory Visit: Payer: Self-pay

## 2021-02-15 ENCOUNTER — Ambulatory Visit: Payer: Medicare Other | Admitting: Pulmonary Disease

## 2021-02-15 VITALS — BP 138/64 | HR 87 | Temp 98.0°F | Ht 66.0 in | Wt 181.0 lb

## 2021-02-15 DIAGNOSIS — J9611 Chronic respiratory failure with hypoxia: Secondary | ICD-10-CM | POA: Diagnosis not present

## 2021-02-15 DIAGNOSIS — J449 Chronic obstructive pulmonary disease, unspecified: Secondary | ICD-10-CM

## 2021-02-15 NOTE — Patient Instructions (Signed)
Continue graded exercise as tolerated  Continue BiPAP at night  Give [place] a call in about a month to let me know if he is still seeing a lot of events If the number of events is still high-a mask change may be helpful otherwise we have to consider changing the pressures  Continue oxygen supplementation and check your oxygen intermittently to make sure you are keeping it above 90  I will see you in 3 months

## 2021-02-15 NOTE — Progress Notes (Signed)
Tiffany Jennings    [id number]    [birth year]  Primary Care Physician:Kremer, Talmadge Coventry, MD  Referring Physician: Mliss Sax, MD [address] [place],  [place] [address]  Chief complaint:   Patient with a history of obstructive lung disease, chronic respiratory failure In for follow-up today  HPI:  Diagnosed with COPD many years ago Relocated from [place]  Has been doing relatively well Exercising regularly Compliant with inhalers Not feeling acutely ill  On Trelegy  Concerned about BiPAP -Machine download shows that she is having many events  Smokes about 3 to 4 cigarettes a day, over a pack a day in the past History of chronic respiratory failure-has been on oxygen at 2 L She checks her oxygen on a regular basis, off oxygen she runs as low as 83, mostly keeps it in the low 90s with oxygen at 2 L She is able to walk with a walker about 50 yards or bit more  History of atrial fibrillation  History of chronic respiratory failure, was started on BiPAP-has been on BiPAP for 2 to 3 years    Outpatient Encounter Medications as of 02/15/2021  Medication Sig   albuterol (VENTOLIN HFA) 108 (90 Base) MCG/ACT inhaler INHALE 2 PUFFS INTO THE LUNGS EVERY 6 HOURS AS NEEDED FOR WHEEZING OR SHORTNESS OF BREATH   atorvastatin (LIPITOR) 20 MG tablet TAKE 1 TABLET(20 MG) BY MOUTH DAILY   carboxymethylcellulose (REFRESH PLUS) 0.5 % SOLN 1 drop 3 (three) times daily as needed.   diltiazem (CARDIZEM CD) 120 MG 24 hr capsule TAKE 1 CAPSULE(120 MG) BY MOUTH DAILY   donepezil (ARICEPT) 10 MG tablet TAKE 1 TABLET(10 MG) BY MOUTH AT BEDTIME   ELIQUIS 5 MG TABS tablet TAKE 1 TABLET(5 MG) BY MOUTH TWICE DAILY   imipramine (TOFRANIL-PM) 100 MG capsule Take 1 capsule (100 mg total) by mouth at bedtime.   levothyroxine (SYNTHROID) 50 MCG tablet Take 1 tablet (50 mcg total) by mouth daily before breakfast.   TRELEGY ELLIPTA 100-62.5-25 MCG/INH AEPB INHALE 1 PUFF  INTO THE LUNGS DAILY   No facility-administered encounter medications on file as of 02/15/2021.    Allergies as of 02/15/2021 - Review Complete 02/15/2021  Allergen Reaction Noted   Codeine  02/19/2018    Past Medical History:  Diagnosis Date   A-fib [facility])    COPD (chronic obstructive pulmonary disease) (HCC)    Hyperlipidemia    Hypertension     Past Surgical History:  Procedure Laterality Date   CAROTID ANGIOGRAM      No family history on file.  Social History   Socioeconomic History   Marital status: Single    Spouse name: Not on file   Number of children: Not on file   Years of education: Not on file   Highest education level: Not on file  Occupational History   Not on file  Tobacco Use   Smoking status: Every Day    Packs/day: 0.25    Years: 50.00    Pack years: 12.50    Types: Cigarettes   Smokeless tobacco: Never   Tobacco comments:    2 cigs a day  Substance and Sexual Activity   Alcohol use: Not Currently   Drug use: Never   Sexual activity: Not on file  Other Topics Concern   Not on file  Social History Narrative   Not on file   Social Determinants of Health   Financial Resource Strain: Not on file  Food Insecurity: No Food Insecurity   Worried About Programme researcher, broadcasting/film/video in the Last Year: Never true   Barista in the Last Year: Never true  Transportation Needs: Not on file  Physical Activity: Sufficiently Active   Days of Exercise per Week: 7 days   Minutes of Exercise per Session: 30 min  Stress: No Stress Concern Present   Feeling of Stress : Not at all  Social Connections: Moderately Isolated   Frequency of Communication with Friends and Family: More than three times a week   Frequency of Social Gatherings with Friends and Family: Once a week   Attends Religious Services: Never   Database administrator or Organizations: Yes   Attends Banker Meetings: 1 to 4 times per year   Marital Status: Widowed  Careers information officer Violence: Not At Risk   Fear of Current or Ex-Partner: No   Emotionally Abused: No   Physically Abused: No   Sexually Abused: No    Review of Systems  Constitutional: Negative.   HENT: Negative.    Eyes: Negative.   Respiratory:  Positive for shortness of breath and wheezing.   Cardiovascular: Negative.   All other systems reviewed and are negative.  Vitals:   02/15/21 1052  BP: 138/64  Pulse: 87  Temp: 98 F (36.7 C)  SpO2: 96%     Physical Exam Constitutional:      Appearance: She is well-developed.  HENT:     Head: Normocephalic and atraumatic.  Eyes:     General:        Right eye: No discharge.        Left eye: No discharge.     Pupils: Pupils are equal, round, and reactive to light.  Neck:     Thyroid: No thyromegaly.     Trachea: No tracheal deviation.  Cardiovascular:     Rate and Rhythm: Normal rate and regular rhythm.  Pulmonary:     Effort: Pulmonary effort is normal. No respiratory distress.     Breath sounds: No wheezing or rales.  Musculoskeletal:     Cervical back: No rigidity.    Data Reviewed: Records from previous primary physician reviewed Had a CT scan done in 2019 showing some granulomatous disease-stable from previous No PFT  BiPAP compliance reveals 100% compliance BiPAP set between 12/5 AHI of 6.2  Most recent compliance data from 01/16/2021 to 02/14/2021 reveals 90% compliance BiPAP of 12/6 AHI of 20.8 Occasional mask leaks   Assessment:   Chronic respiratory failure -Obstructive lung disease -Continue oxygen supplementation -Continue BiPAP at night -We will follow-up on download from her machine in the next month -Encouraged to continue using BiPAP regularly and pay attention to mask issues  Chronic obstructive pulmonary disease -Controlled on current symptoms -Currently on Trelegy  Active smoker Smoking cessation counseling Continue to work on quitting   Deconditioning -Graded exercise as tolerated -Has  been exercising regularly and seems to be benefiting  Atrial fibrillation -Controlled  Plan/Recommendations:  Continue BiPAP  -We will get download in about a month -Benefits of continuing with BiPAP reiterated  Graded exercise as tolerated  Monitor pulse oximetry to help ascertain adequate oxygen supplementation  I will see you back in 3 months  I spent 30 minutes dedicated to the care of this patient on the date of this encounter to include previsit review of records, face-to-face time with the patient discussing conditions above, post visit ordering of testing, clinical documentation with electronic health  record and communicated necessary findings to members of the patient's care team   Encouraged to call with any significant concerns  Virl Diamond MD [facility] 02/15/2021, 11:14 AM  CC: Mliss Sax,*

## 2021-03-02 ENCOUNTER — Telehealth: Payer: Self-pay | Admitting: Family Medicine

## 2021-03-02 ENCOUNTER — Telehealth: Payer: Self-pay | Admitting: Pulmonary Disease

## 2021-03-02 MED ORDER — TRELEGY ELLIPTA 100-62.5-25 MCG/INH IN AEPB: 1.0000 | INHALATION_SPRAY | Freq: Every day | RESPIRATORY_TRACT | 5 refills | Status: DC

## 2021-03-02 NOTE — Telephone Encounter (Signed)
Call made to patient, daughter Verlon Au [facility]) confirmed patient DOB. Confirmed medication and pharmacy. Refill sent.   Nothing further needed at this time.

## 2021-03-02 NOTE — Telephone Encounter (Signed)
Pt daughter Verlon Au called and said that they changed pharmacies, [facility] at [address] [place], [place], [place] [address]. She said she is getting meds transferred to the new pharmacy but said that they wont transfer anything that doesn't have refills so she will call us back with those

## 2021-03-10 ENCOUNTER — Telehealth: Payer: Self-pay | Admitting: Family Medicine

## 2021-03-10 NOTE — Telephone Encounter (Signed)
What is the name of the medication? donepezil (ARICEPT) 10 MG tablet [[id number]] and ELIQUIS 5 MG TABS tablet [[id number]]  Have you contacted your pharmacy to request a refill? Pt's daughter has called in needing a refill on these scripts.   Which pharmacy would you like this sent to? [facility] at [address] [place], [place], [place], [address]    Patient notified that their request is being sent to the clinical staff for review and that they should receive a call once it is complete. If they do not receive a call within 72 hours they can check with their pharmacy or our office.

## 2021-03-15 ENCOUNTER — Other Ambulatory Visit: Payer: Self-pay

## 2021-03-15 NOTE — Telephone Encounter (Signed)
Refill request for pending Rx last refill 09/18/20 last OV 02/06/21 please advise. Patient have received refills on Donepezil 10 mg at [facility] with 90 day supply no refills needed for this requested Rx daughter and patient aware.

## 2021-03-16 MED ORDER — APIXABAN 5 MG PO TABS: ORAL_TABLET | ORAL | 5 refills | Status: DC

## 2021-03-16 NOTE — Telephone Encounter (Signed)
Done

## 2021-03-17 ENCOUNTER — Telehealth: Payer: Self-pay | Admitting: Pulmonary Disease

## 2021-03-17 DIAGNOSIS — J449 Chronic obstructive pulmonary disease, unspecified: Secondary | ICD-10-CM | POA: Diagnosis not present

## 2021-03-17 NOTE — Telephone Encounter (Signed)
LM informing patient we got the message. Cpap report printed and placed in AO review folder.   AO please advise once available. Thanks. Report in your review folder.

## 2021-03-20 NOTE — Telephone Encounter (Signed)
Called and spoke with patient. She is aware of the compliance report and verbalized understanding of the results.   Nothing further needed at time of call.

## 2021-03-20 NOTE — Telephone Encounter (Signed)
CPAP compliance was reviewed  Shows machine is working well No changes need to be made Follow-up as previously scheduled  100% compliance Sleeping for 6 hours 10 minutes Machine set at 12/5 backup rate of 10 AHI 6.2

## 2021-03-22 DIAGNOSIS — M545 Low back pain, unspecified: Secondary | ICD-10-CM | POA: Diagnosis not present

## 2021-03-29 DIAGNOSIS — M545 Low back pain, unspecified: Secondary | ICD-10-CM | POA: Diagnosis not present

## 2021-04-10 DIAGNOSIS — H18453 Nodular corneal degeneration, bilateral: Secondary | ICD-10-CM | POA: Diagnosis not present

## 2021-04-10 DIAGNOSIS — Z961 Presence of intraocular lens: Secondary | ICD-10-CM | POA: Diagnosis not present

## 2021-04-10 DIAGNOSIS — H16223 Keratoconjunctivitis sicca, not specified as Sjogren's, bilateral: Secondary | ICD-10-CM | POA: Diagnosis not present

## 2021-04-17 DIAGNOSIS — J449 Chronic obstructive pulmonary disease, unspecified: Secondary | ICD-10-CM | POA: Diagnosis not present

## 2021-04-18 ENCOUNTER — Telehealth: Payer: Self-pay | Admitting: Family Medicine

## 2021-04-18 DIAGNOSIS — E039 Hypothyroidism, unspecified: Secondary | ICD-10-CM

## 2021-04-18 MED ORDER — LEVOTHYROXINE SODIUM 50 MCG PO TABS: 50.0000 ug | ORAL_TABLET | Freq: Every day | ORAL | 4 refills | Status: DC

## 2021-04-18 NOTE — Telephone Encounter (Signed)
What is the name of the medication? levothyroxine (SYNTHROID) 50 MCG tablet [[id number]]   Have you contacted your pharmacy to request a refill? Pt is needing a refill on this script, she is completely out.   Which pharmacy would you like this sent to? [id number] [place], [place] - [address]  [address] [place] [address]  Phone:  [phone]  Fax:  [phone]    Patient notified that their request is being sent to the clinical staff for review and that they should receive a call once it is complete. If they do not receive a call within 72 hours they can check with their pharmacy or our office.

## 2021-04-18 NOTE — Telephone Encounter (Signed)
Refill sent in called patient to inform, no answer informed via VM

## 2021-04-20 DIAGNOSIS — M545 Low back pain, unspecified: Secondary | ICD-10-CM | POA: Diagnosis not present

## 2021-04-27 ENCOUNTER — Telehealth: Payer: Self-pay

## 2021-04-27 NOTE — Progress Notes (Signed)
Chronic Care Management Pharmacy Assistant   Name: Tiffany Jennings  MRN: [id number] DOB: 1934-07-17  Reason for Encounter:Hypertension Disease State Call.  Recent office visits:  02/06/2021 Dr. Doreene Burke MD (PCP) change imipramine 100 MG capsule  Recent consult visits:  02/15/2021 Dr.Olakere MD (Pulmonology) No Medication Changes noted  Hospital visits:  None in previous 6 months  Medications: Outpatient Encounter Medications as of 04/27/2021  Medication Sig   albuterol (VENTOLIN HFA) 108 (90 Base) MCG/ACT inhaler INHALE 2 PUFFS INTO THE LUNGS EVERY 6 HOURS AS NEEDED FOR WHEEZING OR SHORTNESS OF BREATH   apixaban (ELIQUIS) 5 MG TABS tablet TAKE 1 TABLET(5 MG) BY MOUTH TWICE DAILY   atorvastatin (LIPITOR) 20 MG tablet TAKE 1 TABLET(20 MG) BY MOUTH DAILY   carboxymethylcellulose (REFRESH PLUS) 0.5 % SOLN 1 drop 3 (three) times daily as needed.   diltiazem (CARDIZEM CD) 120 MG 24 hr capsule TAKE 1 CAPSULE(120 MG) BY MOUTH DAILY   donepezil (ARICEPT) 10 MG tablet TAKE 1 TABLET(10 MG) BY MOUTH AT BEDTIME   Fluticasone-Umeclidin-Vilant (TRELEGY ELLIPTA) 100-62.5-25 MCG/INH AEPB Inhale 1 puff into the lungs daily.   imipramine (TOFRANIL-PM) 100 MG capsule Take 1 capsule (100 mg total) by mouth at bedtime.   levothyroxine (SYNTHROID) 50 MCG tablet Take 1 tablet (50 mcg total) by mouth daily before breakfast.   No facility-administered encounter medications on file as of 04/27/2021.   Care Gaps: Urine Microalbumin Tetanus Vaccine Shingrix Vaccine PNA Vaccine Influenza Vaccine Star Rating Drugs: Atorvastatin 20 mg last filled 01/05/2021 for 90 day supply at [facility]. Medication Fill Gaps: N/A  Reviewed chart prior to disease state call. Spoke with patient regarding BP  Recent Office Vitals: BP Readings from Last 3 Encounters:  02/15/21 138/64  02/06/21 122/68  01/04/21 (!) 110/50   Pulse Readings from Last 3 Encounters:  02/15/21 87  02/06/21 88  01/04/21 78     Wt Readings from Last 3 Encounters:  02/15/21 181 lb (82.1 kg)  02/06/21 179 lb (81.2 kg)  01/04/21 179 lb (81.2 kg)     Kidney Function Lab Results  Component Value Date/Time   CREATININE 0.95 02/06/2021 11:55 AM   CREATININE 1.06 (H) 08/23/2020 10:14 AM   CREATININE 1.38 (H) 03/06/2019 11:42 AM   GFR 55.35 (L) 02/06/2021 11:55 AM   GFRNONAA 46 (L) 06/27/2020 11:45 AM   GFRAA 52 (L) 06/27/2020 11:45 AM    BMP Latest Ref Rng & Units 02/06/2021 08/23/2020 08/17/2020  Glucose 70 - 99 mg/dL 253(G) 644(I) 90  BUN 6 - 23 mg/dL 21 20 16   Creatinine 0.40 - 1.20 mg/dL 3.47) 4.25(Z  BUN/Creat Ratio 12 - 28 - 19 16  Sodium 135 - 145 mEq/L 140 138 144  Potassium 3.5 - 5.1 mEq/L 4.4 4.2 4.5  Chloride 96 - 112 mEq/L 102 101 105  CO2 19 - 32 mEq/L 29 22 24   Calcium 8.4 - 10.5 mg/dL 9.7 9.4 9.3    Current antihypertensive regimen:  Diltiazem 120 mg 1 capsule daily How often are you checking your Blood Pressure? infrequently Current home BP readings:  Patient states she is unsure what her blood pressure is ranging  because she has not check it in a while.Patient states her blood pressure has been "good". What recent interventions/DTPs have been made by any provider to improve Blood Pressure control since last CPP Visit: None ID Any recent hospitalizations or ED visits since last visit with CPP? No What diet changes have been made to improve Blood Pressure Control?  Patient denies any changes with her diet.  What exercise is being done to improve your Blood Pressure Control?  Patient reports walking daily.  Patient denies any questions or concerns at this time.  Adherence Review: Is the patient currently on ACE/ARB medication? No Does the patient have >5 day gap between last estimated fill dates? No  Everlean Cherry Clinical Lobbyist [phone]

## 2021-05-04 DIAGNOSIS — M545 Low back pain, unspecified: Secondary | ICD-10-CM | POA: Diagnosis not present

## 2021-05-09 ENCOUNTER — Telehealth: Payer: Self-pay | Admitting: Family Medicine

## 2021-05-09 DIAGNOSIS — F339 Major depressive disorder, recurrent, unspecified: Secondary | ICD-10-CM

## 2021-05-09 MED ORDER — DONEPEZIL HCL 10 MG PO TABS: ORAL_TABLET | ORAL | 1 refills | Status: DC

## 2021-05-09 NOTE — Telephone Encounter (Signed)
Pt daughter called and said that pt needs a refill on donepezil (ARICEPT) 10 MG tablet She needs it sent to [facility] at [address] [place], [place], [place] [address]

## 2021-05-10 ENCOUNTER — Telehealth: Payer: Self-pay | Admitting: Family Medicine

## 2021-05-10 NOTE — Telephone Encounter (Signed)
Pt's daughter called in wanting an appointment for her mom. Pt has a temperature of 100.1, coughing up phlegm for the past 2 days. She also has COPD, I transferred her over to Nurse Triage. We have no available appointments for the day.

## 2021-05-10 NOTE — Telephone Encounter (Signed)
FYI message below, will call to check on patient

## 2021-05-17 DIAGNOSIS — J449 Chronic obstructive pulmonary disease, unspecified: Secondary | ICD-10-CM | POA: Diagnosis not present

## 2021-05-26 ENCOUNTER — Ambulatory Visit (INDEPENDENT_AMBULATORY_CARE_PROVIDER_SITE_OTHER): Payer: Medicare Other | Admitting: Family Medicine

## 2021-05-26 ENCOUNTER — Encounter: Payer: Self-pay | Admitting: Family Medicine

## 2021-05-26 ENCOUNTER — Other Ambulatory Visit: Payer: Self-pay

## 2021-05-26 VITALS — BP 122/64 | HR 64 | Temp 97.4°F | Ht 66.0 in | Wt 176.4 lb

## 2021-05-26 DIAGNOSIS — R058 Other specified cough: Secondary | ICD-10-CM

## 2021-05-26 DIAGNOSIS — H6983 Other specified disorders of Eustachian tube, bilateral: Secondary | ICD-10-CM

## 2021-05-26 DIAGNOSIS — Z23 Encounter for immunization: Secondary | ICD-10-CM

## 2021-05-26 MED ORDER — PREDNISONE 10 MG PO TABS
10.0000 mg | ORAL_TABLET | Freq: Two times a day (BID) | ORAL | 0 refills | Status: AC
Start: 2021-05-26 — End: 2021-05-31

## 2021-05-26 NOTE — Progress Notes (Signed)
Established Patient Office Visit  Subjective:  Patient ID: Tiffany Jennings, female    DOB: 11-Jan-1935  Age: 86 y.o. MRN: [id number]  CC:  Chief Complaint  Patient presents with   Cough    Cough, chest congestion ears clogged symptoms x 2-3 weeks.    HPI [name] presents for follow-up status post having developed URI signs and symptoms 2 weeks ago that included headache fever chills postnasal drip cough with myalgias and arthralgias.  There is no particular change in her breathing status during this time.  She had tested negative for COVID several times.  Mr. [name] shot this year.  She has been left with an ongoing cough that is minimally productive of clear phlegm.  There has been no recent fevers or chills.  Her breathing is status quo.  Her ears are congested.  She has tried eustachian tube exercises without much relief.  Past Medical History:  Diagnosis Date   A-fib [facility])    COPD (chronic obstructive pulmonary disease) ([place])    Hyperlipidemia    Hypertension     Past Surgical History:  Procedure Laterality Date   CAROTID ANGIOGRAM      History reviewed. No pertinent family history.  Social History   Socioeconomic History   Marital status: Single    Spouse name: Not on file   Number of children: Not on file   Years of education: Not on file   Highest education level: Not on file  Occupational History   Not on file  Tobacco Use   Smoking status: Every Day    Packs/day: 0.25    Years: 50.00    Pack years: 12.50    Types: Cigarettes   Smokeless tobacco: Never   Tobacco comments:    2 cigs a day  Substance and Sexual Activity   Alcohol use: Not Currently   Drug use: Never   Sexual activity: Not on file  Other Topics Concern   Not on file  Social History Narrative   Not on file   Social Determinants of Health   Financial Resource Strain: Not on file  Food Insecurity: No Food Insecurity   Worried About Running Out of Food in the Last Year: Never true    Ran Out of Food in the Last Year: Never true  Transportation Needs: Not on file  Physical Activity: Sufficiently Active   Days of Exercise per Week: 7 days   Minutes of Exercise per Session: 30 min  Stress: No Stress Concern Present   Feeling of Stress : Not at all  Social Connections: Moderately Isolated   Frequency of Communication with Friends and Family: More than three times a week   Frequency of Social Gatherings with Friends and Family: Once a week   Attends Religious Services: Never   Marine scientist or Organizations: Yes   Attends Archivist Meetings: 1 to 4 times per year   Marital Status: Widowed  Human resources officer Violence: Not At Risk   Fear of Current or Ex-Partner: No   Emotionally Abused: No   Physically Abused: No   Sexually Abused: No    Outpatient Medications Prior to Visit  Medication Sig Dispense Refill   albuterol (VENTOLIN HFA) 108 (90 Base) MCG/ACT inhaler INHALE 2 PUFFS INTO THE LUNGS EVERY 6 HOURS AS NEEDED FOR WHEEZING OR SHORTNESS OF BREATH 18 g 1   apixaban (ELIQUIS) 5 MG TABS tablet TAKE 1 TABLET(5 MG) BY MOUTH TWICE DAILY 60 tablet 5   atorvastatin (LIPITOR)  20 MG tablet TAKE 1 TABLET(20 MG) BY MOUTH DAILY 90 tablet 3   carboxymethylcellulose (REFRESH PLUS) 0.5 % SOLN 1 drop 3 (three) times daily as needed.     diltiazem (CARDIZEM CD) 120 MG 24 hr capsule TAKE 1 CAPSULE(120 MG) BY MOUTH DAILY 90 capsule 3   donepezil (ARICEPT) 10 MG tablet TAKE 1 TABLET(10 MG) BY MOUTH AT BEDTIME 90 tablet 1   Fluticasone-Umeclidin-Vilant (TRELEGY ELLIPTA) 100-62.5-25 MCG/INH AEPB Inhale 1 puff into the lungs daily. 60 each 5   imipramine (TOFRANIL-PM) 100 MG capsule Take 1 capsule (100 mg total) by mouth at bedtime. 90 capsule 3   levothyroxine (SYNTHROID) 50 MCG tablet Take 1 tablet (50 mcg total) by mouth daily before breakfast. 90 tablet 4   No facility-administered medications prior to visit.    Allergies  Allergen Reactions   Codeine      ROS Review of Systems  Constitutional:  Negative for diaphoresis, fatigue, fever and unexpected weight change.  HENT: Negative.    Eyes:  Negative for photophobia and visual disturbance.  Respiratory:  Positive for cough, shortness of breath and wheezing. Negative for chest tightness.   Cardiovascular: Negative.   Gastrointestinal: Negative.   Endocrine: Negative for polyphagia and polyuria.  Genitourinary: Negative.   Musculoskeletal:  Negative for gait problem and joint swelling.  Neurological:  Negative for speech difficulty and weakness.  Psychiatric/Behavioral: Negative.       Objective:    Physical Exam Vitals and nursing note reviewed.  Constitutional:      General: She is not in acute distress.    Appearance: Normal appearance. She is not ill-appearing, toxic-appearing or diaphoretic.  HENT:     Head: Normocephalic and atraumatic.     Right Ear: No middle ear effusion. Tympanic membrane is retracted. Tympanic membrane is not injected or erythematous.     Left Ear:  No middle ear effusion. Tympanic membrane is retracted. Tympanic membrane is not injected or erythematous.     Mouth/Throat:     Mouth: Mucous membranes are moist.     Pharynx: Oropharynx is clear. No oropharyngeal exudate or posterior oropharyngeal erythema.  Eyes:     General: No scleral icterus.       Right eye: No discharge.        Left eye: No discharge.     Extraocular Movements: Extraocular movements intact.     Conjunctiva/sclera: Conjunctivae normal.     Pupils: Pupils are equal, round, and reactive to light.  Neck:     Vascular: No carotid bruit.  Cardiovascular:     Rate and Rhythm: Normal rate and regular rhythm.  Pulmonary:     Breath sounds: Decreased air movement present. No stridor. Decreased breath sounds present. No wheezing, rhonchi or rales.  Musculoskeletal:     Cervical back: No rigidity or tenderness.  Lymphadenopathy:     Cervical: No cervical adenopathy.  Skin:     General: Skin is warm and dry.  Neurological:     Mental Status: She is alert and oriented to person, place, and time.  Psychiatric:        Mood and Affect: Mood normal.        Behavior: Behavior normal.    BP 122/64 (BP Location: Left Arm, Patient Position: Sitting, Cuff Size: Normal)   Pulse 64   Temp (!) 97.4 F (36.3 C) (Temporal)   Ht _0  (1.676 m)   Wt 176 lb 6.4 oz (80 kg)   SpO2 95%   BMI 28.47 kg/m  Wt Readings from Last 3 Encounters:  05/26/21 176 lb 6.4 oz (80 kg)  02/15/21 181 lb (82.1 kg)  02/06/21 179 lb (81.2 kg)     Health Maintenance Due  Topic Date Due   URINE MICROALBUMIN  Never done   TETANUS/TDAP  Never done   Zoster Vaccines- Shingrix (1 of 2) Never done   Pneumonia Vaccine 48+ Years old (2 - PPSV23 if available, else PCV20) 06/25/2019    There are no preventive care reminders to display for this patient.  Lab Results  Component Value Date   TSH 1.95 02/06/2021   Lab Results  Component Value Date   WBC 10.5 02/06/2021   HGB 12.1 02/06/2021   HCT 38.0 02/06/2021   MCV 86.5 02/06/2021   PLT 375.0 02/06/2021   Lab Results  Component Value Date   NA 140 02/06/2021   K 4.4 02/06/2021   CO2 29 02/06/2021   GLUCOSE 108 (H) 02/06/2021   BUN 21 02/06/2021   CREATININE 0.95 02/06/2021   BILITOT 0.4 02/06/2021   ALKPHOS 113 02/06/2021   AST 17 02/06/2021   ALT 18 02/06/2021   PROT 6.9 02/06/2021   ALBUMIN 4.1 02/06/2021   CALCIUM 9.7 02/06/2021   EGFR 52 (L) 08/23/2020   GFR 55.35 (L) 02/06/2021   Lab Results  Component Value Date   CHOL 158 02/06/2021   Lab Results  Component Value Date   HDL 83.20 02/06/2021   Lab Results  Component Value Date   LDLCALC 62 02/06/2021   Lab Results  Component Value Date   TRIG 67.0 02/06/2021   Lab Results  Component Value Date   CHOLHDL 2 02/06/2021   Lab Results  Component Value Date   HGBA1C 6.3 06/22/2019      Assessment & Plan:   Problem List Items Addressed This Visit        Respiratory   Post-viral cough syndrome   Relevant Medications   predniSONE (DELTASONE) 10 MG tablet     Nervous and Auditory   Dysfunction of both eustachian tubes - Primary   Relevant Medications   predniSONE (DELTASONE) 10 MG tablet   Other Visit Diagnoses     Flu vaccine need       Relevant Orders   Flu vaccine HIGH DOSE PF (Fluzone High dose) (Completed)       Meds ordered this encounter  Medications   predniSONE (DELTASONE) 10 MG tablet    Sig: Take 1 tablet (10 mg total) by mouth 2 (two) times daily with a meal for 5 days.    Dispense:  10 tablet    Refill:  0    Follow-up: Return if symptoms worsen or fail to improve.   Reports of prednisone for eustachian tube dysfunction and postviral cough.  Follow-up if not continuing to improve.  Information given on eustachian tube dysfunction . Libby Maw, MD

## 2021-06-01 ENCOUNTER — Other Ambulatory Visit: Payer: Self-pay | Admitting: Family Medicine

## 2021-06-01 NOTE — Telephone Encounter (Signed)
Chart supports Rx Last seen 02/06/21 No future appt scheduled.

## 2021-06-21 ENCOUNTER — Telehealth: Payer: Self-pay | Admitting: Family Medicine

## 2021-06-23 NOTE — Telephone Encounter (Signed)
Returned patients call no answer LM detailed message on identified VM informing patient that yes she should have 2nd booster. Asked to call back with any concerns.

## 2021-07-05 DIAGNOSIS — M545 Low back pain, unspecified: Secondary | ICD-10-CM | POA: Diagnosis not present

## 2021-07-11 IMAGING — US US ABDOMEN COMPLETE
1 series · 13 of 25 positions shown · non-contrast
Comparison: None.

CLINICAL DATA: 84-year-old female with elevated alkaline
phosphatase. Denies bone pain.

EXAM:
ABDOMEN ULTRASOUND COMPLETE

[Series 1: us abdomen complete · 0.28mm/px · 13 of 110 slices shown]
[im 1/110]
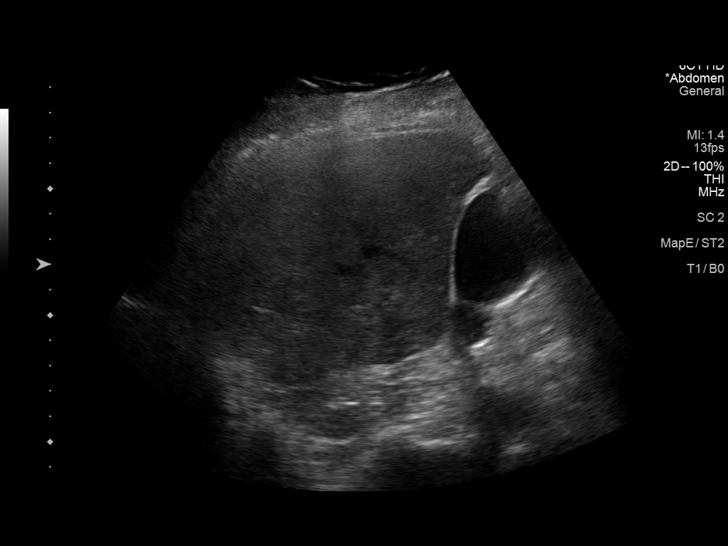
[im 10/110]
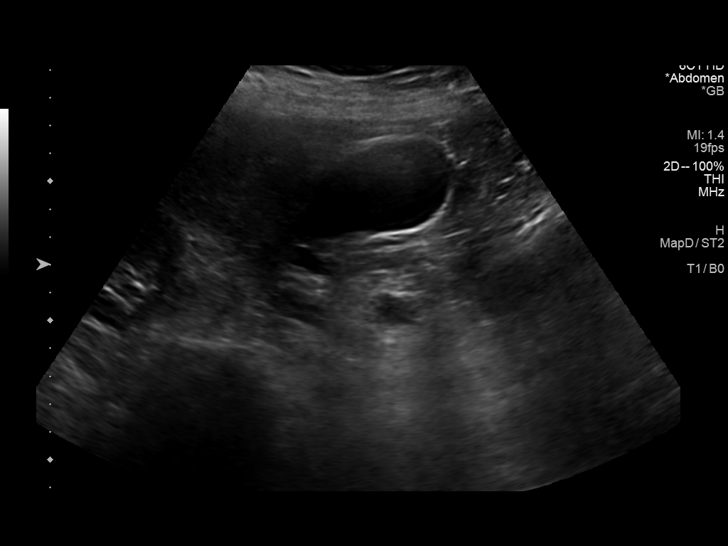
[im 19/110]
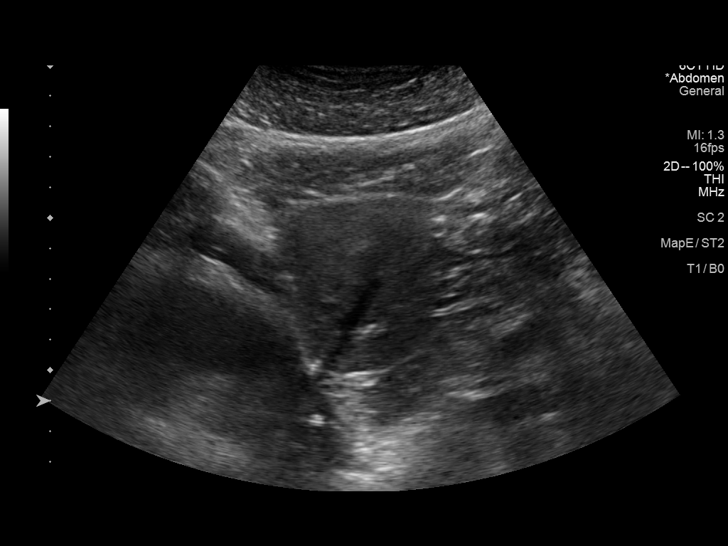
[im 28/110]
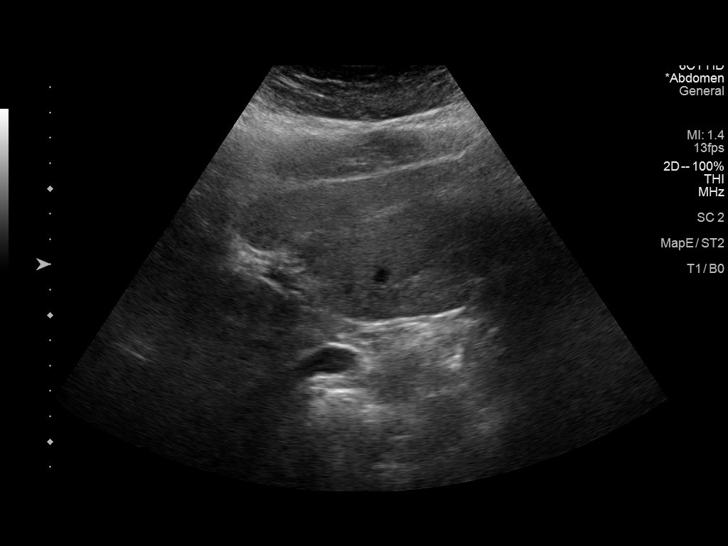
[im 37/110]
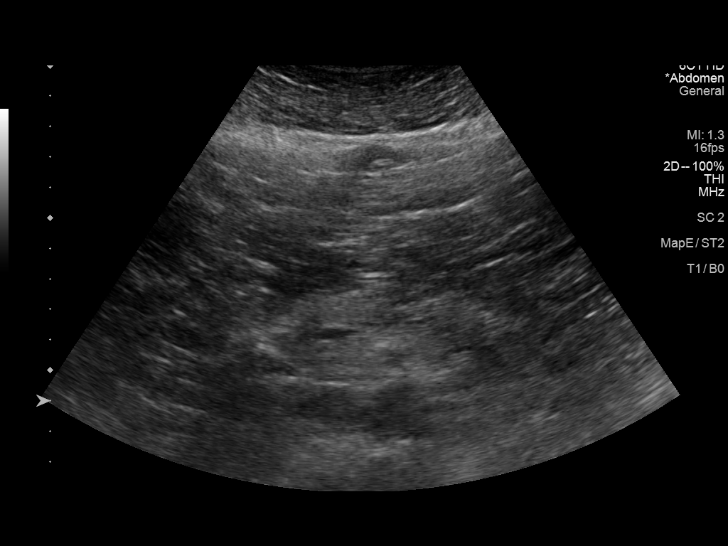
[im 46/110]
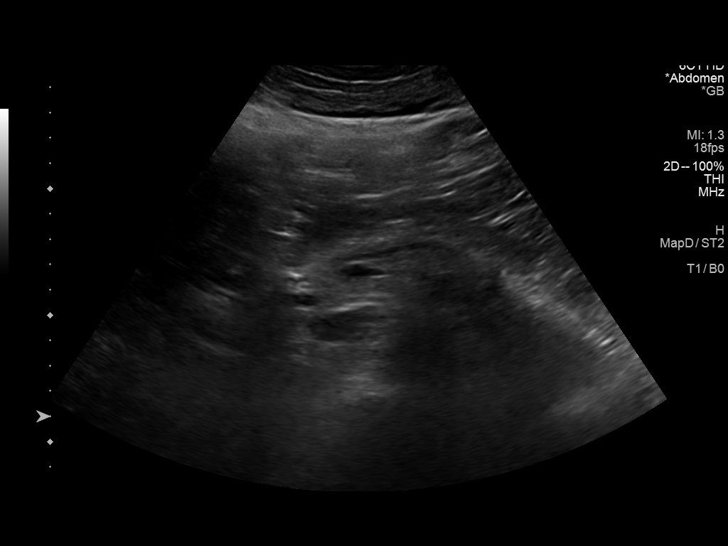
[im 55/110]
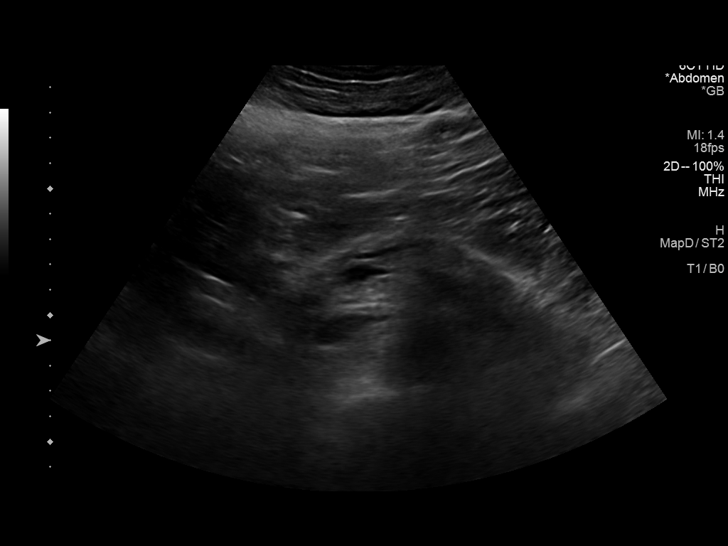
[im 64/110]
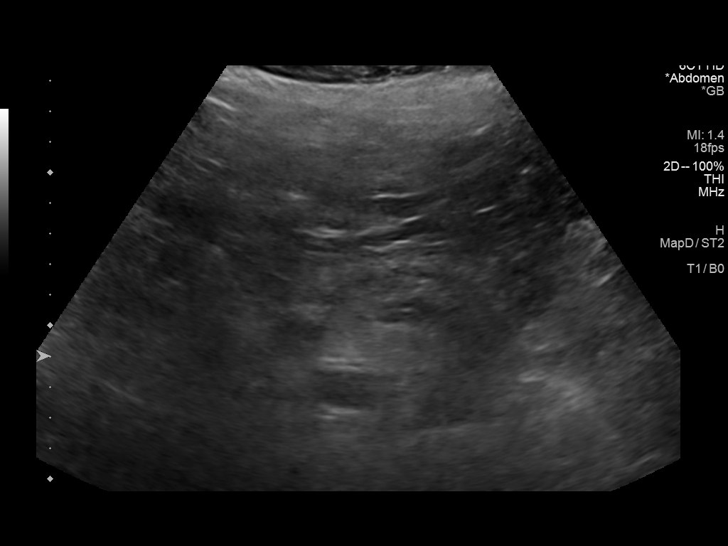
[im 73/110]
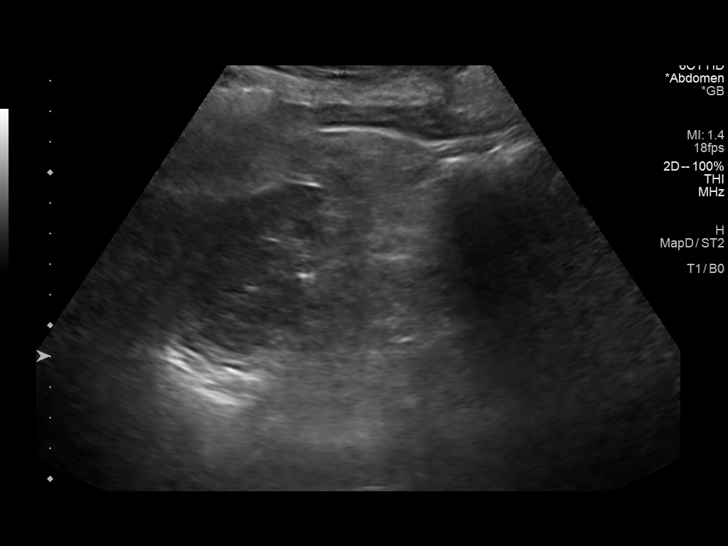
[im 82/110]
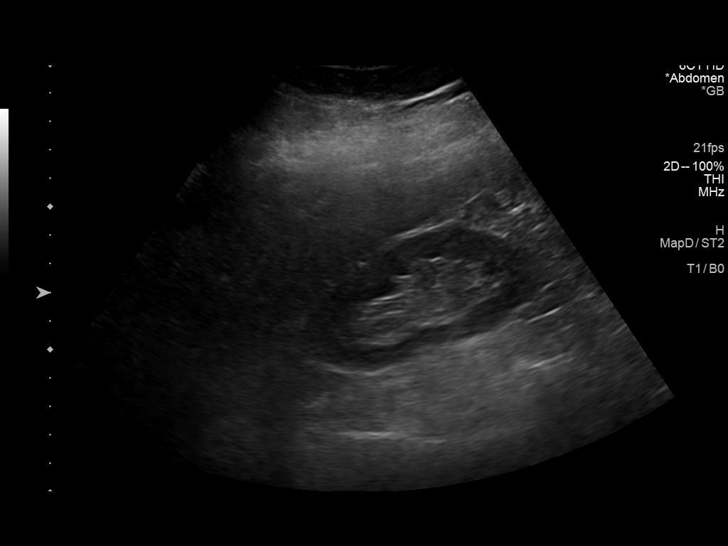
[im 91/110]
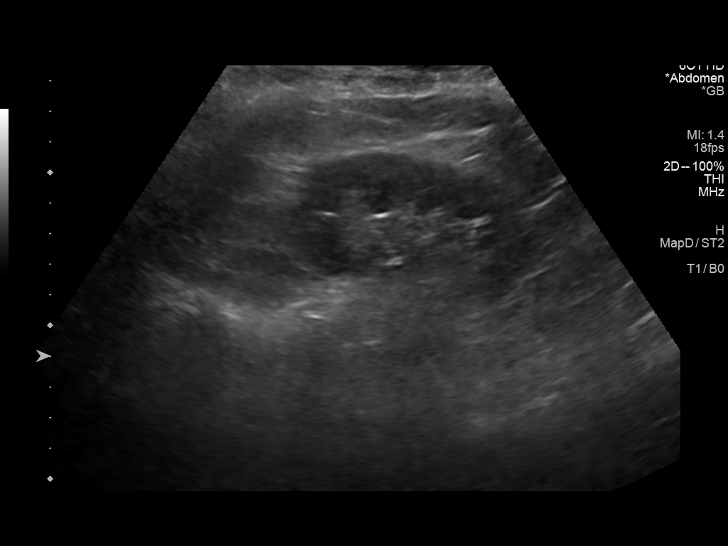
[im 100/110]
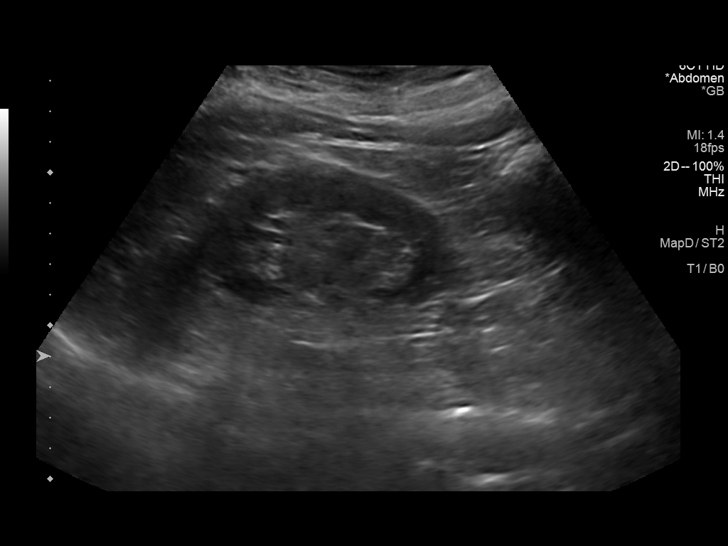
[im 110/110]
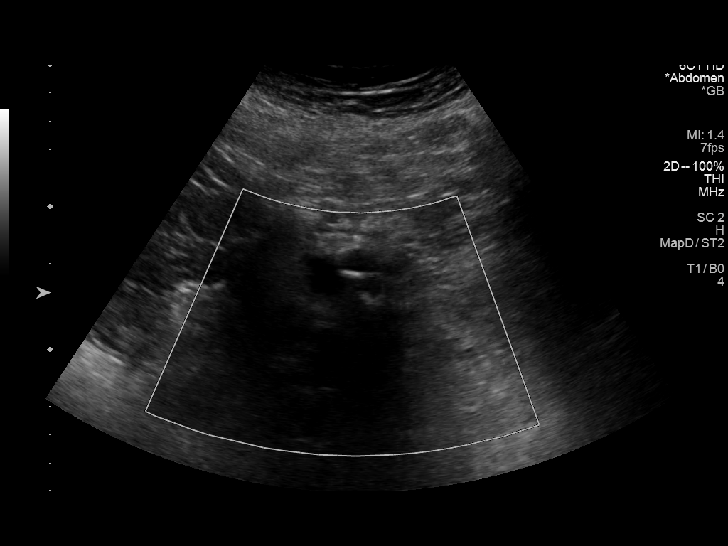

[13 of 25 positions shown; findings below may reference images not displayed]

FINDINGS: Gallbladder: No gallstones or wall thickening visualized. No
sonographic Murphy sign noted by sonographer.

Common bile duct: Diameter: 4-5 mm, normal.

Liver: Liver echogenicity at the upper limits of normal (image 82).
No discrete liver lesion. No intrahepatic biliary ductal dilatation.
Portal vein is patent on color Doppler imaging with normal direction
of blood flow towards the liver.

IVC: No abnormality visualized.

Pancreas: Visualized portion unremarkable. Main pancreatic duct at
the upper limits of normal (image 46).

Spleen: Size and appearance within normal limits.

Right Kidney: Length: 8.9 cm. Echogenicity within normal limits. No
mass or hydronephrosis visualized.

Left Kidney: Length: 9.2 cm. Echogenicity within normal limits. No
mass or hydronephrosis visualized.

Abdominal aorta: Vessel irregularity in keeping with atherosclerosis
(image 108). The infrarenal aorta appears mildly ectatic, 25 mm
(image 110).

Other findings: No free fluid.
IMPRESSION: 1. Liver and biliary system appear within normal limits by
ultrasound.

2. Evidence of aortic atherosclerosis and Ectatic abdominal aorta at
risk for aneurysm development. Recommend followup by Ultrasound in 5
years. This recommendation follows ACR consensus guidelines: White
Paper of the ACR Incidental Findings Committee II on Vascular
Findings. [HOSPITAL] 1726; [DATE].
Aortic aneurysm NOS (UC979-A35.3)

## 2021-07-12 DIAGNOSIS — M545 Low back pain, unspecified: Secondary | ICD-10-CM | POA: Diagnosis not present

## 2021-07-18 DIAGNOSIS — J449 Chronic obstructive pulmonary disease, unspecified: Secondary | ICD-10-CM | POA: Diagnosis not present

## 2021-07-19 DIAGNOSIS — M545 Low back pain, unspecified: Secondary | ICD-10-CM | POA: Diagnosis not present

## 2021-07-26 DIAGNOSIS — M545 Low back pain, unspecified: Secondary | ICD-10-CM | POA: Diagnosis not present

## 2021-07-28 ENCOUNTER — Telehealth: Payer: Self-pay

## 2021-07-28 NOTE — Progress Notes (Signed)
Chronic Care Management Pharmacy Assistant   Name: Tiffany Jennings  MRN: [id number] DOB: 1935/04/27  Reason for Encounter:Hypertension Disease State Call.  Recent office visits:  05/26/2021 Dr. Doreene Burke MD (PCP) Start Prednisone 10 mg 2 times daily  Recent consult visits:  No recent Consult visit  Hospital visits:  None in previous 6 months  Medications: Outpatient Encounter Medications as of 07/28/2021  Medication Sig   albuterol (VENTOLIN HFA) 108 (90 Base) MCG/ACT inhaler INHALE 2 PUFFS INTO THE LUNGS EVERY 6 HOURS AS NEEDED FOR WHEEZING OR SHORTNESS OF BREATH   apixaban (ELIQUIS) 5 MG TABS tablet TAKE 1 TABLET(5 MG) BY MOUTH TWICE DAILY   atorvastatin (LIPITOR) 20 MG tablet TAKE 1 TABLET(20 MG) BY MOUTH DAILY   carboxymethylcellulose (REFRESH PLUS) 0.5 % SOLN 1 drop 3 (three) times daily as needed.   diltiazem (CARDIZEM CD) 120 MG 24 hr capsule TAKE 1 CAPSULE(120 MG) BY MOUTH DAILY   donepezil (ARICEPT) 10 MG tablet TAKE 1 TABLET(10 MG) BY MOUTH AT BEDTIME   Fluticasone-Umeclidin-Vilant (TRELEGY ELLIPTA) 100-62.5-25 MCG/INH AEPB Inhale 1 puff into the lungs daily.   imipramine (TOFRANIL) 50 MG tablet TAKE ONE TO TWO TABLETS BY MOUTH EVERY NIGHT AT BEDTIME   imipramine (TOFRANIL-PM) 100 MG capsule Take 1 capsule (100 mg total) by mouth at bedtime.   levothyroxine (SYNTHROID) 50 MCG tablet Take 1 tablet (50 mcg total) by mouth daily before breakfast.   No facility-administered encounter medications on file as of 07/28/2021.    Care Gaps: Urine Microalbumin Tetanus Vaccine Shingrix Vaccine PNA Vaccine  Star Rating Drugs: Atorvastatin 20 mg last filled 07/03/2021 for 90 day supply at [facility]. Medication Fill Gaps: None ID  Reviewed chart prior to disease state call. Spoke with patient regarding BP  Recent Office Vitals: BP Readings from Last 3 Encounters:  05/26/21 122/64  02/15/21 138/64  02/06/21 122/68   Pulse Readings from Last 3 Encounters:   05/26/21 64  02/15/21 87  02/06/21 88    Wt Readings from Last 3 Encounters:  05/26/21 176 lb 6.4 oz (80 kg)  02/15/21 181 lb (82.1 kg)  02/06/21 179 lb (81.2 kg)     Kidney Function Lab Results  Component Value Date/Time   CREATININE 0.95 02/06/2021 11:55 AM   CREATININE 1.06 (H) 08/23/2020 10:14 AM   CREATININE 1.38 (H) 03/06/2019 11:42 AM   GFR 55.35 (L) 02/06/2021 11:55 AM   GFRNONAA 46 (L) 06/27/2020 11:45 AM   GFRAA 52 (L) 06/27/2020 11:45 AM    BMP Latest Ref Rng & Units 02/06/2021 08/23/2020 08/17/2020  Glucose 70 - 99 mg/dL 962(I) 297(L) 90  BUN 6 - 23 mg/dL 21 20 16   Creatinine 0.40 - 1.20 mg/dL 8.92) 1.19(E  BUN/Creat Ratio 12 - 28 - 19 16  Sodium 135 - 145 mEq/L 140 138 144  Potassium 3.5 - 5.1 mEq/L 4.4 4.2 4.5  Chloride 96 - 112 mEq/L 102 101 105  CO2 19 - 32 mEq/L 29 22 24   Calcium 8.4 - 10.5 mg/dL 9.7 9.4 9.3    Current antihypertensive regimen:  Diltiazem 120 mg 1 capsule daily How often are you checking your Blood Pressure? infrequently Current home BP readings:  Patient states her blood pressure is ranging "good". What recent interventions/DTPs have been made by any provider to improve Blood Pressure control since last CPP Visit: None ID Any recent hospitalizations or ED visits since last visit with CPP? No What diet changes have been made to improve Blood Pressure Control?  None  ID What exercise is being done to improve your Blood Pressure Control?  None ID  Adherence Review: Is the patient currently on ACE/ARB medication? No Does the patient have >5 day gap between last estimated fill dates? No  Inform patient we could schedule follow up appointment with the clinical pharmacist since it been over a year she has  follow up with him.Patient states she is unsure why she needs the appointment, but she is okay with scheduling one.  Telephone follow up appointment with Care management team member was scheduled for : 11/16/2021 at 3:00 pm.  Everlean Cherry Clinical Pharmacist Assistant [phone]

## 2021-07-31 ENCOUNTER — Other Ambulatory Visit: Payer: Self-pay

## 2021-08-01 ENCOUNTER — Ambulatory Visit: Payer: Medicare Other | Admitting: Family Medicine

## 2021-08-02 ENCOUNTER — Ambulatory Visit: Payer: Medicare Other | Admitting: Family Medicine

## 2021-08-02 DIAGNOSIS — M545 Low back pain, unspecified: Secondary | ICD-10-CM | POA: Diagnosis not present

## 2021-08-03 ENCOUNTER — Ambulatory Visit: Payer: Medicare Other | Admitting: Family Medicine

## 2021-08-04 ENCOUNTER — Ambulatory Visit (INDEPENDENT_AMBULATORY_CARE_PROVIDER_SITE_OTHER): Payer: Medicare Other | Admitting: Family Medicine

## 2021-08-04 ENCOUNTER — Encounter: Payer: Self-pay | Admitting: Family Medicine

## 2021-08-04 ENCOUNTER — Other Ambulatory Visit: Payer: Self-pay

## 2021-08-04 VITALS — BP 122/68 | HR 58 | Temp 97.3°F | Ht 66.0 in | Wt 186.0 lb

## 2021-08-04 DIAGNOSIS — F339 Major depressive disorder, recurrent, unspecified: Secondary | ICD-10-CM | POA: Diagnosis not present

## 2021-08-04 DIAGNOSIS — I10 Essential (primary) hypertension: Secondary | ICD-10-CM

## 2021-08-04 DIAGNOSIS — Z720 Tobacco use: Secondary | ICD-10-CM

## 2021-08-04 LAB — BASIC METABOLIC PANEL
BUN: 18 mg/dL (ref 6–23)
CO2: 32 mEq/L (ref 19–32)
Calcium: 9.3 mg/dL (ref 8.4–10.5)
Chloride: 103 mEq/L (ref 96–112)
Creatinine, Ser: 1.2 mg/dL (ref 0.40–1.20)
GFR: 41.68 mL/min — ABNORMAL LOW (ref >60.00)
Glucose, Bld: 97 mg/dL (ref 70–99)
Potassium: 4.7 mEq/L (ref 3.5–5.1)
Sodium: 140 mEq/L (ref 135–145)

## 2021-08-04 NOTE — Progress Notes (Signed)
Established Patient Office Visit  Subjective:  Patient ID: Tiffany Jennings, female    DOB: 12-14-1934  Age: 87 y.o. MRN: [id number]  CC:  Chief Complaint  Patient presents with   Nutrition Counseling    Discuss weight loss and working out.     HPI Tiffany Jennings presents for follow-up of hypertension, hypothyroidism, depression and tobacco use.  Hypothyroidism well-controlled on current dose of levothyroxine.  Blood pressure and pulse rate controlled with diltiazem.  She has been working out at [employer].  Pulse rate rarely moves above 90 with physical activity.  She is trying to lose weight.  Longstanding history of depression that is responded well to imipramine.  Continues to smoke a few cigarettes a day and is not going to quit.  She is aware of the risk with concomitant oxygen therapy.  She turned off for her smoke breaks.  Her smart watch shows pulse ox is 96 to 98%.  Blood pressure runs in the 120s over 80s.  Past Medical History:  Diagnosis Date   A-fib [facility])    COPD (chronic obstructive pulmonary disease) ([place])    Hyperlipidemia    Hypertension     Past Surgical History:  Procedure Laterality Date   CAROTID ANGIOGRAM      No family history on file.  Social History   Socioeconomic History   Marital status: Single    Spouse name: Not on file   Number of children: Not on file   Years of education: Not on file   Highest education level: Not on file  Occupational History   Not on file  Tobacco Use   Smoking status: Every Day    Packs/day: 0.25    Years: 50.00    Pack years: 12.50    Types: Cigarettes   Smokeless tobacco: Never   Tobacco comments:    2 cigs a day  Substance and Sexual Activity   Alcohol use: Not Currently   Drug use: Never   Sexual activity: Not on file  Other Topics Concern   Not on file  Social History Narrative   Not on file   Social Determinants of Health   Financial Resource Strain: Not on file  Food Insecurity: No Food Insecurity    Worried About Running Out of Food in the Last Year: Never true   Ran Out of Food in the Last Year: Never true  Transportation Needs: Not on file  Physical Activity: Sufficiently Active   Days of Exercise per Week: 7 days   Minutes of Exercise per Session: 30 min  Stress: No Stress Concern Present   Feeling of Stress : Not at all  Social Connections: Moderately Isolated   Frequency of Communication with Friends and Family: More than three times a week   Frequency of Social Gatherings with Friends and Family: Once a week   Attends Religious Services: Never   Marine scientist or Organizations: Yes   Attends Archivist Meetings: 1 to 4 times per year   Marital Status: Widowed  Human resources officer Violence: Not At Risk   Fear of Current or Ex-Partner: No   Emotionally Abused: No   Physically Abused: No   Sexually Abused: No    Outpatient Medications Prior to Visit  Medication Sig Dispense Refill   albuterol (VENTOLIN HFA) 108 (90 Base) MCG/ACT inhaler INHALE 2 PUFFS INTO THE LUNGS EVERY 6 HOURS AS NEEDED FOR WHEEZING OR SHORTNESS OF BREATH 18 g 1   apixaban (ELIQUIS) 5 MG  TABS tablet TAKE 1 TABLET(5 MG) BY MOUTH TWICE DAILY 60 tablet 5   atorvastatin (LIPITOR) 20 MG tablet TAKE 1 TABLET(20 MG) BY MOUTH DAILY 90 tablet 3   carboxymethylcellulose (REFRESH PLUS) 0.5 % SOLN 1 drop 3 (three) times daily as needed.     diltiazem (CARDIZEM CD) 120 MG 24 hr capsule TAKE 1 CAPSULE(120 MG) BY MOUTH DAILY 90 capsule 3   donepezil (ARICEPT) 10 MG tablet TAKE 1 TABLET(10 MG) BY MOUTH AT BEDTIME 90 tablet 1   Fluticasone-Umeclidin-Vilant (TRELEGY ELLIPTA) 100-62.5-25 MCG/INH AEPB Inhale 1 puff into the lungs daily. 60 each 5   imipramine (TOFRANIL) 50 MG tablet TAKE ONE TO TWO TABLETS BY MOUTH EVERY NIGHT AT BEDTIME 180 tablet 3   levothyroxine (SYNTHROID) 50 MCG tablet Take 1 tablet (50 mcg total) by mouth daily before breakfast. 90 tablet 4   imipramine (TOFRANIL-PM) 100 MG capsule Take  1 capsule (100 mg total) by mouth at bedtime. 90 capsule 3   No facility-administered medications prior to visit.    Allergies  Allergen Reactions   Codeine     ROS Review of Systems  Constitutional:  Negative for chills, diaphoresis, fatigue, fever and unexpected weight change.  HENT: Negative.    Respiratory: Negative.    Cardiovascular: Negative.   Gastrointestinal: Negative.   Genitourinary: Negative.   Neurological:  Negative for speech difficulty, weakness and headaches.  Hematological: Negative.   Psychiatric/Behavioral: Negative.       Objective:    Physical Exam Vitals and nursing note reviewed.  Constitutional:      General: She is not in acute distress.    Appearance: Normal appearance. She is not ill-appearing, toxic-appearing or diaphoretic.  HENT:     Head: Normocephalic and atraumatic.     Right Ear: External ear normal.     Left Ear: External ear normal.     Mouth/Throat:     Pharynx: Oropharynx is clear. No oropharyngeal exudate or posterior oropharyngeal erythema.  Eyes:     General: No scleral icterus.       Right eye: No discharge.        Left eye: No discharge.     Conjunctiva/sclera: Conjunctivae normal.  Cardiovascular:     Rate and Rhythm: Normal rate.  Pulmonary:     Effort: Pulmonary effort is normal.  Musculoskeletal:     Cervical back: No rigidity or tenderness.  Lymphadenopathy:     Cervical: No cervical adenopathy.  Skin:    General: Skin is warm and dry.  Neurological:     Mental Status: She is alert and oriented to person, place, and time.  Psychiatric:        Behavior: Behavior normal.    BP 122/68 (BP Location: Left Arm, Patient Position: Sitting, Cuff Size: Large)    Pulse (!) 58    Temp (!) 97.3 F (36.3 C) (Temporal)    Ht 5' 6" (1.676 m)    Wt 186 lb (84.4 kg)    SpO2 95%    BMI 30.02 kg/m  Wt Readings from Last 3 Encounters:  08/04/21 186 lb (84.4 kg)  05/26/21 176 lb 6.4 oz (80 kg)  02/15/21 181 lb (82.1 kg)      Health Maintenance Due  Topic Date Due   URINE MICROALBUMIN  Never done   Zoster Vaccines- Shingrix (1 of 2) Never done   Pneumonia Vaccine 16+ Years old (2 - PPSV23 if available, else PCV20) 06/25/2019    There are no preventive care reminders to display  for this patient.  Lab Results  Component Value Date   TSH 1.95 02/06/2021   Lab Results  Component Value Date   WBC 10.5 02/06/2021   HGB 12.1 02/06/2021   HCT 38.0 02/06/2021   MCV 86.5 02/06/2021   PLT 375.0 02/06/2021   Lab Results  Component Value Date   NA 140 02/06/2021   K 4.4 02/06/2021   CO2 29 02/06/2021   GLUCOSE 108 (H) 02/06/2021   BUN 21 02/06/2021   CREATININE 0.95 02/06/2021   BILITOT 0.4 02/06/2021   ALKPHOS 113 02/06/2021   AST 17 02/06/2021   ALT 18 02/06/2021   PROT 6.9 02/06/2021   ALBUMIN 4.1 02/06/2021   CALCIUM 9.7 02/06/2021   EGFR 52 (L) 08/23/2020   GFR 55.35 (L) 02/06/2021   Lab Results  Component Value Date   CHOL 158 02/06/2021   Lab Results  Component Value Date   HDL 83.20 02/06/2021   Lab Results  Component Value Date   LDLCALC 62 02/06/2021   Lab Results  Component Value Date   TRIG 67.0 02/06/2021   Lab Results  Component Value Date   CHOLHDL 2 02/06/2021   Lab Results  Component Value Date   HGBA1C 6.3 06/22/2019      Assessment & Plan:   Problem List Items Addressed This Visit       Cardiovascular and Mediastinum   Essential hypertension   Relevant Orders   Basic metabolic panel     Other   Depression, recurrent ([place]) - Primary   Tobacco abuse    No orders of the defined types were placed in this encounter.   Follow-up: Return in about 6 months (around 02/01/2022), or Please be fasting at your next office visit..  Continue current medications.  Information was given on the Mediterranean diet.  Libby Maw, MD

## 2021-08-10 ENCOUNTER — Telehealth: Payer: Self-pay | Admitting: Family Medicine

## 2021-08-10 DIAGNOSIS — M545 Low back pain, unspecified: Secondary | ICD-10-CM | POA: Diagnosis not present

## 2021-08-10 NOTE — Telephone Encounter (Signed)
Returned patients call pt verbally understood no more than 3-4 bananas a week.

## 2021-08-10 NOTE — Telephone Encounter (Signed)
Pt called to ask how many bananas she can consume.  Said Dr Doreene Burke limited her potassium intake.

## 2021-08-11 ENCOUNTER — Telehealth: Payer: Self-pay | Admitting: Pulmonary Disease

## 2021-08-11 DIAGNOSIS — G4733 Obstructive sleep apnea (adult) (pediatric): Secondary | ICD-10-CM

## 2021-08-11 NOTE — Telephone Encounter (Signed)
Called and spoke with patient. She stated that she is in need of her bipap supplies from [place]. She had called them earlier today but they advised her that they did not have an order for supplies on hand.   She was last seen by AO in August and was instructed to keep using her machine.   Dr. Halford Chessman, would you be ok with signing the order for her bipap supplies? I have pended the order.

## 2021-08-11 NOTE — Telephone Encounter (Signed)
Order signed.

## 2021-08-15 DIAGNOSIS — J449 Chronic obstructive pulmonary disease, unspecified: Secondary | ICD-10-CM | POA: Diagnosis not present

## 2021-08-16 DIAGNOSIS — M545 Low back pain, unspecified: Secondary | ICD-10-CM | POA: Diagnosis not present

## 2021-09-09 ENCOUNTER — Other Ambulatory Visit: Payer: Self-pay | Admitting: Family Medicine

## 2021-09-15 DIAGNOSIS — J449 Chronic obstructive pulmonary disease, unspecified: Secondary | ICD-10-CM | POA: Diagnosis not present

## 2021-09-19 ENCOUNTER — Telehealth: Payer: Self-pay | Admitting: Family Medicine

## 2021-09-19 NOTE — Telephone Encounter (Signed)
Pt is wanting to know how many cups of caffeine can she have a day? Please advise pt at [phone]. ?

## 2021-09-19 NOTE — Telephone Encounter (Signed)
Please advise message below  °

## 2021-09-20 ENCOUNTER — Telehealth: Payer: Self-pay | Admitting: Pulmonary Disease

## 2021-09-20 DIAGNOSIS — J9611 Chronic respiratory failure with hypoxia: Secondary | ICD-10-CM

## 2021-09-20 DIAGNOSIS — G4733 Obstructive sleep apnea (adult) (pediatric): Secondary | ICD-10-CM

## 2021-09-20 NOTE — Telephone Encounter (Signed)
Patient aware 2 cups of caffeine a day  ?

## 2021-09-20 NOTE — Telephone Encounter (Signed)
Called [facility] but stayed on hold for almost 15 mins without speaking with someone. Will contact them later today.  ? ?

## 2021-09-25 ENCOUNTER — Telehealth: Payer: Self-pay

## 2021-09-25 NOTE — Progress Notes (Signed)
? ? ?Chronic Care Management ?Pharmacy Assistant  ? ?Name: Tiffany Jennings  MRN: [id number] DOB: 1935-04-08 ? ?Reason for Encounter: Hypertension Disease State Call. ?  ?Recent office visits:  ?08/04/2021 Dr. Doreene Burke MD (PCP) No Medication Changes noted, return in 6 months ? ?Recent consult visits:  ?No recent consult visit ? ?Hospital visits:  ?None in previous 6 months ? ?Medications: ?Outpatient Encounter Medications as of 09/25/2021  ?Medication Sig  ? albuterol (VENTOLIN HFA) 108 (90 Base) MCG/ACT inhaler INHALE 2 PUFFS INTO THE LUNGS EVERY 6 HOURS AS NEEDED FOR WHEEZING OR SHORTNESS OF BREATH  ? atorvastatin (LIPITOR) 20 MG tablet TAKE 1 TABLET(20 MG) BY MOUTH DAILY  ? carboxymethylcellulose (REFRESH PLUS) 0.5 % SOLN 1 drop 3 (three) times daily as needed.  ? diltiazem (CARDIZEM CD) 120 MG 24 hr capsule TAKE 1 CAPSULE(120 MG) BY MOUTH DAILY  ? donepezil (ARICEPT) 10 MG tablet TAKE 1 TABLET(10 MG) BY MOUTH AT BEDTIME  ? ELIQUIS 5 MG TABS tablet TAKE ONE TABLET BY MOUTH TWICE A DAY  ? Fluticasone-Umeclidin-Vilant (TRELEGY ELLIPTA) 100-62.5-25 MCG/INH AEPB Inhale 1 puff into the lungs daily.  ? imipramine (TOFRANIL) 50 MG tablet TAKE ONE TO TWO TABLETS BY MOUTH EVERY NIGHT AT BEDTIME  ? levothyroxine (SYNTHROID) 50 MCG tablet Take 1 tablet (50 mcg total) by mouth daily before breakfast.  ? ?No facility-administered encounter medications on file as of 09/25/2021.  ? ? ?are Gaps: ?Urine Microalbumin ?Shingrix Vaccine ?PNA Vaccine ?  ?Star Rating Drugs: ?Atorvastatin 20 mg last filled 07/03/2021 for 90 day supply at [facility]. ? ?Medication Fill Gaps: ?None ID ? ?Reviewed chart prior to disease state call. Spoke with patient regarding BP ? ?Recent Office Vitals: ?BP Readings from Last 3 Encounters:  ?08/04/21 122/68  ?05/26/21 122/64  ?02/15/21 138/64  ? ?Pulse Readings from Last 3 Encounters:  ?08/04/21 (!) 58  ?05/26/21 64  ?02/15/21 87  ?  ?Wt Readings from Last 3 Encounters:  ?08/04/21 186 lb (84.4 kg)   ?05/26/21 176 lb 6.4 oz (80 kg)  ?02/15/21 181 lb (82.1 kg)  ?  ? ?Kidney Function ?Lab Results  ?Component Value Date/Time  ? CREATININE 1.20 08/04/2021 01:47 PM  ? CREATININE 0.95 02/06/2021 11:55 AM  ? CREATININE 1.38 (H) 03/06/2019 11:42 AM  ? GFR 41.68 (L) 08/04/2021 01:47 PM  ? GFRNONAA 46 (L) 06/27/2020 11:45 AM  ? GFRAA 52 (L) 06/27/2020 11:45 AM  ? ? ? ?  Latest Ref Rng & Units 08/04/2021  ?  1:47 PM 02/06/2021  ? 11:55 AM 08/23/2020  ? 10:14 AM  ?BMP  ?Glucose 70 - 99 mg/dL 97   979   892    ?BUN 6 - 23 mg/dL 18   21   20     ?Creatinine 0.40 - 1.20 mg/dL   1.19   4.17    ?BUN/Creat Ratio 12 - 28   19    ?Sodium 135 - 145 mEq/L 140   140   138    ?Potassium 3.5 - 5.1 mEq/L 4.7   4.4   4.2    ?Chloride 96 - 112 mEq/L 103   102   101    ?CO2 19 - 32 mEq/L 32   29   22    ?Calcium 8.4 - 10.5 mg/dL 9.3   9.7   9.4    ? ? ?Current antihypertensive regimen:  ?Diltiazem 120 mg 1 capsule daily ? ?I have attempted without success to contact this patient by phone three times to  do her Hypertension  Disease State call. I left a Voice message for patient to return my call.  ? ?Adherence Review: ?Is the patient currently on ACE/ARB medication? No ?Does the patient have >5 day gap between last estimated fill dates? No ? ?Telephone follow up appointment with Care management team member was scheduled for : 11/16/2021 at 3:00 pm. ? ?Everlean Cherry ?Clinical Pharmacist Assistant ?[phone]  ? ? ? ?

## 2021-09-27 NOTE — Telephone Encounter (Signed)
Called [employer] and spoke with Karna Christmas, she states since the patient was set up in [place] and they place dit under the order COPD they did not require a sleep study for her Bipap. ? ?Patient is calling for a new mask but that can not get done till the below tests are done.  ? ?[employer] states for them to cover her Bipap that they need ?ONO on oxygen ?ABG  ? ?Will forward to [place] since she made a note to Dr Jenetta Downer about this patient already when I went over to [facility] to talk to her about this patient. ? ? ?

## 2021-09-27 NOTE — Telephone Encounter (Signed)
I called and spoke with Tiffany Jennings and she will need ONO with oxygen and they need an ABG before she can get her supplies. Please advise if you are ok with ordering the tests?  ? ?She also wants to see if she can have a nasal pillow mask.  ? ? ?

## 2021-09-27 NOTE — Telephone Encounter (Signed)
Patient checking on new mask. Patient phone number is [phone]. ?

## 2021-09-28 ENCOUNTER — Other Ambulatory Visit: Payer: Self-pay | Admitting: Pulmonary Disease

## 2021-10-02 NOTE — Telephone Encounter (Signed)
Okay to order overnight oximetry ? ?Okay to order ABG ? ?Okay to place order for nasal pillow mask ?

## 2021-10-05 NOTE — Telephone Encounter (Signed)
I called the patient and she is aware we are ordering additional testing for is to get her qualified for the CPAP. She did not have any other questions.  ?

## 2021-10-10 ENCOUNTER — Other Ambulatory Visit: Payer: Self-pay | Admitting: Cardiology

## 2021-10-12 DIAGNOSIS — J9602 Acute respiratory failure with hypercapnia: Secondary | ICD-10-CM | POA: Diagnosis not present

## 2021-10-12 DIAGNOSIS — J449 Chronic obstructive pulmonary disease, unspecified: Secondary | ICD-10-CM | POA: Diagnosis not present

## 2021-10-12 DIAGNOSIS — G9340 Encephalopathy, unspecified: Secondary | ICD-10-CM | POA: Diagnosis not present

## 2021-10-15 DIAGNOSIS — J449 Chronic obstructive pulmonary disease, unspecified: Secondary | ICD-10-CM | POA: Diagnosis not present

## 2021-11-02 DIAGNOSIS — M545 Low back pain, unspecified: Secondary | ICD-10-CM | POA: Diagnosis not present

## 2021-11-06 ENCOUNTER — Other Ambulatory Visit: Payer: Self-pay | Admitting: Family Medicine

## 2021-11-06 DIAGNOSIS — F339 Major depressive disorder, recurrent, unspecified: Secondary | ICD-10-CM

## 2021-11-14 ENCOUNTER — Telehealth: Payer: Self-pay | Admitting: *Deleted

## 2021-11-14 NOTE — Chronic Care Management (AMB) (Signed)
  Chronic Care Management Note  11/14/2021 Name: Tiffany Jennings MRN: [id number] DOB: 1935/03/05  Tiffany Jennings is a 87 y.o. year old female who is a primary care patient of Mliss Sax, MD and is actively engaged with the care management team. I reached out to [facility] by phone today to assist with re-scheduling a follow up visit with the Pharmacist  Follow up plan: Unsuccessful telephone outreach attempt made. A HIPAA compliant phone message was left for the patient providing contact information and requesting a return call.   Burman Nieves, CCMA Care Guide, Embedded Care Coordination [facility]  Care Management  Direct Dial: [phone]

## 2021-11-15 DIAGNOSIS — J449 Chronic obstructive pulmonary disease, unspecified: Secondary | ICD-10-CM | POA: Diagnosis not present

## 2021-11-15 DIAGNOSIS — M545 Low back pain, unspecified: Secondary | ICD-10-CM | POA: Diagnosis not present

## 2021-11-16 ENCOUNTER — Telehealth: Payer: Medicare Other

## 2021-11-21 DIAGNOSIS — M545 Low back pain, unspecified: Secondary | ICD-10-CM | POA: Diagnosis not present

## 2021-12-06 ENCOUNTER — Telehealth: Payer: Self-pay

## 2021-12-06 DIAGNOSIS — Z961 Presence of intraocular lens: Secondary | ICD-10-CM | POA: Diagnosis not present

## 2021-12-06 DIAGNOSIS — H16223 Keratoconjunctivitis sicca, not specified as Sjogren's, bilateral: Secondary | ICD-10-CM | POA: Diagnosis not present

## 2021-12-06 DIAGNOSIS — H18453 Nodular corneal degeneration, bilateral: Secondary | ICD-10-CM | POA: Diagnosis not present

## 2021-12-06 NOTE — Telephone Encounter (Signed)
Patient unenrolled from CCM.   Angelena Sole, PharmD, Patsy [place], CPP Clinical Pharmacist Practitioner  [facility]  [phone]

## 2021-12-06 NOTE — Chronic Care Management (AMB) (Signed)
  Chronic Care Management Note  12/06/2021 Name: Makana Feigel MRN: [id number] DOB: [birth year]  Glennys Schorsch is a 87 y.o. year old female who is a primary care patient of Mliss Sax, MD and is actively engaged with the care management team. I reached out to [facility] by phone today to assist with re-scheduling a follow up visit with the Pharmacist  Follow up plan: 2nd Unsuccessful telephone outreach attempt made. A HIPAA compliant phone message was left for the patient providing contact information and requesting a return call.   Burman Nieves, CCMA Care Guide, Embedded Care Coordination [facility]  Care Management  Direct Dial: [phone]

## 2021-12-06 NOTE — Chronic Care Management (AMB) (Signed)
  Chronic Care Management Note  12/06/2021 Name: Tiffany Jennings MRN: [id number] DOB: 1935/06/04  Tiffany Jennings is a 87 y.o. year old female who is a primary care patient of Mliss Sax, MD and is actively engaged with the care management team. I reached out to [facility] by phone today to assist with re-scheduling a follow up visit with the Pharmacist  Follow up plan: Patient declines further follow up and engagement by the care management team. Appropriate care team members and provider have been notified via electronic communication.   Burman Nieves, CCMA Care Guide, Embedded Care Coordination [facility]  Care Management  Direct Dial: [phone]

## 2021-12-06 NOTE — Telephone Encounter (Signed)
-----   Message from Tiffany Jennings, CMA sent at 12/06/2021 11:41 AM EDT ----- [name] - pt declined to reschedule f/u

## 2021-12-07 DIAGNOSIS — M545 Low back pain, unspecified: Secondary | ICD-10-CM | POA: Diagnosis not present

## 2021-12-15 DIAGNOSIS — J449 Chronic obstructive pulmonary disease, unspecified: Secondary | ICD-10-CM | POA: Diagnosis not present

## 2021-12-18 DIAGNOSIS — M545 Low back pain, unspecified: Secondary | ICD-10-CM | POA: Diagnosis not present

## 2021-12-23 ENCOUNTER — Other Ambulatory Visit: Payer: Self-pay | Admitting: Cardiology

## 2022-01-15 DIAGNOSIS — J449 Chronic obstructive pulmonary disease, unspecified: Secondary | ICD-10-CM | POA: Diagnosis not present

## 2022-01-22 ENCOUNTER — Ambulatory Visit: Payer: Medicare Other | Admitting: Adult Health

## 2022-01-31 DIAGNOSIS — M545 Low back pain, unspecified: Secondary | ICD-10-CM | POA: Diagnosis not present

## 2022-02-09 ENCOUNTER — Telehealth: Payer: Self-pay | Admitting: Pulmonary Disease

## 2022-02-14 NOTE — Telephone Encounter (Signed)
Called and spoke with patient daughter.  Patient left yesterday and flew to [place].  Verlon Au stated patient had no issues.  Verlon Au stated O2 sats had been good so far. Patient has POC, pulse ox, and a smart watch.  Verlon Au stated patient is traveling to [place] to elevation 5400 ft and 6700 ft.  Verlon Au was wanting to know if they should use any precautions and if Dr. Wynona Neat had any recommendations.  Did advise patient keep checking her sats and take rest breaks as needed.  Verlon Au asked about swelling in ankles and I advised patient elevating feet/legs as needed.   Message routed to Dr. Wynona Neat for any additional recommendations.

## 2022-02-15 DIAGNOSIS — J449 Chronic obstructive pulmonary disease, unspecified: Secondary | ICD-10-CM | POA: Diagnosis not present

## 2022-02-20 NOTE — Telephone Encounter (Signed)
Spoke with the pt's daughter and notified of response per Dr Val Eagle  She verbalized understanding  Nothing further needed

## 2022-02-20 NOTE — Telephone Encounter (Signed)
No changes from my perspective  Check oxygen periodically to assess need for oxygen supplementation

## 2022-03-08 ENCOUNTER — Encounter: Payer: Self-pay | Admitting: Family Medicine

## 2022-03-08 ENCOUNTER — Ambulatory Visit (INDEPENDENT_AMBULATORY_CARE_PROVIDER_SITE_OTHER): Payer: Medicare Other | Admitting: Family Medicine

## 2022-03-08 VITALS — BP 122/76 | HR 67 | Temp 97.4°F | Wt 176.2 lb

## 2022-03-08 DIAGNOSIS — R8290 Unspecified abnormal findings in urine: Secondary | ICD-10-CM | POA: Diagnosis not present

## 2022-03-08 DIAGNOSIS — E7800 Pure hypercholesterolemia, unspecified: Secondary | ICD-10-CM

## 2022-03-08 DIAGNOSIS — I480 Paroxysmal atrial fibrillation: Secondary | ICD-10-CM

## 2022-03-08 DIAGNOSIS — Z0000 Encounter for general adult medical examination without abnormal findings: Secondary | ICD-10-CM

## 2022-03-08 DIAGNOSIS — Z23 Encounter for immunization: Secondary | ICD-10-CM | POA: Diagnosis not present

## 2022-03-08 DIAGNOSIS — E039 Hypothyroidism, unspecified: Secondary | ICD-10-CM

## 2022-03-08 DIAGNOSIS — F339 Major depressive disorder, recurrent, unspecified: Secondary | ICD-10-CM | POA: Diagnosis not present

## 2022-03-08 LAB — CBC
HCT: 34.7 % — ABNORMAL LOW (ref 36.0–46.0)
Hemoglobin: 11 g/dL — ABNORMAL LOW (ref 12.0–15.0)
MCHC: 31.6 g/dL (ref 30.0–36.0)
MCV: 82.7 fl (ref 78.0–100.0)
Platelets: 345 10*3/uL (ref 150.0–400.0)
RBC: 4.2 Mil/uL (ref 3.87–5.11)
RDW: 16.4 % — ABNORMAL HIGH (ref 11.5–15.5)
WBC: 11.2 10*3/uL — ABNORMAL HIGH (ref 4.0–10.5)

## 2022-03-08 LAB — COMPREHENSIVE METABOLIC PANEL
ALT: 18 U/L (ref 0–35)
AST: 19 U/L (ref 0–37)
Albumin: 3.8 g/dL (ref 3.5–5.2)
Alkaline Phosphatase: 118 U/L — ABNORMAL HIGH (ref 39–117)
BUN: 29 mg/dL — ABNORMAL HIGH (ref 6–23)
CO2: 28 mEq/L (ref 19–32)
Calcium: 9.5 mg/dL (ref 8.4–10.5)
Chloride: 102 mEq/L (ref 96–112)
Creatinine, Ser: 1.17 mg/dL (ref 0.40–1.20)
GFR: 42.78 mL/min — ABNORMAL LOW (ref >60.00)
Glucose, Bld: 95 mg/dL (ref 70–99)
Potassium: 4.7 mEq/L (ref 3.5–5.1)
Sodium: 138 mEq/L (ref 135–145)
Total Bilirubin: 0.5 mg/dL (ref 0.2–1.2)
Total Protein: 7.1 g/dL (ref 6.0–8.3)

## 2022-03-08 LAB — LIPID PANEL
Cholesterol: 138 mg/dL (ref 0–200)
HDL: 76 mg/dL (ref >39.00)
LDL Cholesterol: 54 mg/dL (ref 0–99)
NonHDL: 61.95
Total CHOL/HDL Ratio: 2
Triglycerides: 40 mg/dL (ref 0.0–149.0)
VLDL: 8 mg/dL (ref 0.0–40.0)

## 2022-03-08 MED ORDER — APIXABAN 5 MG PO TABS: 5.0000 mg | ORAL_TABLET | Freq: Two times a day (BID) | ORAL | 3 refills | Status: DC

## 2022-03-08 NOTE — Progress Notes (Signed)
Established Patient Office Visit  Subjective   Patient ID: Tiffany Jennings, female    DOB: [birth year]  Age: 87 y.o. MRN: [id number]  Chief Complaint  Patient presents with   Establish Care    CPE, swollen ankles that come and go. Patient fasting. Discuss covid booster and RSV vaccine.     HPI follow-up with health maintenance, hypothyroidism, elevated cholesterol, and depression.  Continues imipramine for depression.  She has been on this medication for years and it works well for her.  History of atrial flutter rate controlled with diltiazem.  Occasional lightheadedness when standing.  Denies chest pain or shortness of breath that is beyond her usual dyspnea associated with there history of COPD with oxygen dependency.  [facility].  She walks 30 minutes daily.  She has upper plates and in need of lower plates.  She is in search of a dentist.  Has been taking levothyroxine on a fasting stomach.  Continues with atorvastatin.    Review of Systems  Constitutional: Negative.   HENT: Negative.    Eyes:  Negative for blurred vision, discharge and redness.  Respiratory: Negative.    Cardiovascular: Negative.   Gastrointestinal:  Negative for abdominal pain.  Genitourinary: Negative.   Musculoskeletal: Negative.  Negative for myalgias.  Skin:  Negative for rash.  Neurological:  Negative for tingling, loss of consciousness and weakness.  Endo/Heme/Allergies:  Negative for polydipsia.      03/08/2022   12:04 PM 08/04/2021    1:14 PM 05/26/2021    2:28 PM  Depression screen PHQ 2/9  Decreased Interest 0 0 0  Down, Depressed, Hopeless 0 0 0  PHQ - 2 Score 0 0 0        Objective:     BP 122/76   Pulse 67   Temp (!) 97.4 F (36.3 C)   Wt 176 lb 3.2 oz (79.9 kg)   BMI 28.44 kg/m    Physical Exam Constitutional:      General: She is not in acute distress.    Appearance: Normal appearance. She is not ill-appearing, toxic-appearing or  diaphoretic.  HENT:     Head: Normocephalic and atraumatic.     Right Ear: External ear normal.     Left Ear: External ear normal.     Mouth/Throat:     Mouth: Mucous membranes are moist.     Pharynx: Oropharynx is clear. No oropharyngeal exudate or posterior oropharyngeal erythema.  Eyes:     General: No scleral icterus.       Right eye: No discharge.        Left eye: No discharge.     Extraocular Movements: Extraocular movements intact.     Conjunctiva/sclera: Conjunctivae normal.     Pupils: Pupils are equal, round, and reactive to light.  Cardiovascular:     Rate and Rhythm: Normal rate. Rhythm irregularly irregular.  Pulmonary:     Effort: Pulmonary effort is normal. No respiratory distress.     Breath sounds: Decreased air movement present. Decreased breath sounds present. No wheezing or rhonchi.  Abdominal:     General: Bowel sounds are normal.     Tenderness: There is no abdominal tenderness. There is no guarding.  Musculoskeletal:     Cervical back: No rigidity or tenderness.     Right lower leg: No edema.     Left lower leg: No edema.  Skin:    General: Skin is warm and dry.  Neurological:     Mental Status: She is alert and oriented to person, place, and time.  Psychiatric:        Mood and Affect: Mood normal.        Behavior: Behavior normal.      No results found for any visits on 03/08/22.    The ASCVD Risk score (Arnett DK, et al., 2019) failed to calculate for the following reasons:   The 2019 ASCVD risk score is only valid for ages 40 to 23    Assessment & Plan:   Problem List Items Addressed This Visit       Cardiovascular and Mediastinum   Atrial fibrillation (HCC)   Relevant Medications   apixaban (ELIQUIS) 5 MG TABS tablet     Endocrine   Hypothyroidism   Relevant Orders   CBC   TSH     Other   Depression, recurrent (HCC)   Elevated cholesterol   Relevant Medications   apixaban (ELIQUIS) 5 MG TABS tablet   Other Relevant Orders    Comprehensive metabolic panel   Lipid panel   Need for Streptococcus pneumoniae vaccination   Relevant Orders   Pneumococcal conjugate vaccine 20-valent (Prevnar 20) (Completed)   Flu vaccine need - Primary   Relevant Orders   Flu vaccine HIGH DOSE PF (Fluzone High dose) (Completed)   Healthcare maintenance   Relevant Orders   Urinalysis, Routine w reflex microscopic    Return in about 6 months (around 09/06/2022).  Continues to smoke a few cigarettes a day AGAINST MEDICAL ADVICE.  Understands associated danger with oxygen therapy.  Continue all medicines as above.  Continue working out in [employer].  Continue adequate hydration.  Libby Maw, MD

## 2022-03-09 DIAGNOSIS — R8290 Unspecified abnormal findings in urine: Secondary | ICD-10-CM | POA: Insufficient documentation

## 2022-03-09 LAB — URINALYSIS, ROUTINE W REFLEX MICROSCOPIC
Bilirubin Urine: NEGATIVE
Hgb urine dipstick: NEGATIVE
Ketones, ur: NEGATIVE
Nitrite: POSITIVE — AB
Specific Gravity, Urine: 1.01 (ref 1.000–1.030)
Total Protein, Urine: NEGATIVE
Urine Glucose: NEGATIVE
Urobilinogen, UA: 0.2 (ref 0.0–1.0)
pH: 7 (ref 5.0–8.0)

## 2022-03-09 LAB — TSH: TSH: 2.76 u[IU]/mL (ref 0.35–5.50)

## 2022-03-09 NOTE — Addendum Note (Signed)
Addended by: Jon Billings on: 03/09/2022 01:04 PM   Modules accepted: Orders

## 2022-03-12 ENCOUNTER — Encounter: Payer: Self-pay | Admitting: Pulmonary Disease

## 2022-03-12 ENCOUNTER — Ambulatory Visit: Payer: Medicare Other | Admitting: Pulmonary Disease

## 2022-03-12 VITALS — BP 106/68 | HR 81 | Temp 98.3°F | Ht 66.0 in | Wt 176.0 lb

## 2022-03-12 DIAGNOSIS — J449 Chronic obstructive pulmonary disease, unspecified: Secondary | ICD-10-CM

## 2022-03-12 NOTE — Progress Notes (Signed)
Tiffany Jennings    [id number]    [birth year]  Primary Care Physician:Kremer, Mortimer Fries, MD  Referring Physician: Libby Maw, [place],  [place] [address]  Chief complaint:   Patient with a history of obstructive lung disease, chronic respiratory failure In for follow-up today  HPI:  Diagnosed with COPD many years ago  Hypercapnic respiratory failure for which she was started on BiPAP  Has remained very active, goes to the gym about 5 days a week Exercising regularly Feeling better  Continues with an oxygen concentrator Has managed to quit since the last time she was here  History of chronic respiratory failure-has been on oxygen at 2 L She checks her oxygen on a regular basis, off oxygen she runs as low as 83, mostly keeps it in the low 90s with oxygen at 2 L She is able to walk with a walker about 50 yards or bit more  History of atrial fibrillation  History of chronic respiratory failure, was started on BiPAP-has been on BiPAP for 3-4 years   Outpatient Encounter Medications as of 03/12/2022  Medication Sig   albuterol (VENTOLIN HFA) 108 (90 Base) MCG/ACT inhaler INHALE 2 PUFFS INTO THE LUNGS EVERY 6 HOURS AS NEEDED FOR WHEEZING OR SHORTNESS OF BREATH   apixaban (ELIQUIS) 5 MG TABS tablet Take 1 tablet (5 mg total) by mouth 2 (two) times daily.   atorvastatin (LIPITOR) 20 MG tablet TAKE ONE TABLET BY MOUTH DAILY   carboxymethylcellulose (REFRESH PLUS) 0.5 % SOLN 1 drop 3 (three) times daily as needed.   diltiazem (CARDIZEM CD) 120 MG 24 hr capsule TAKE ONE CAPSULE BY MOUTH DAILY   donepezil (ARICEPT) 10 MG tablet TAKE ONE TABLET BY MOUTH EVERY NIGHT AT BEDTIME   imipramine (TOFRANIL) 50 MG tablet TAKE ONE TO TWO TABLETS BY MOUTH EVERY NIGHT AT BEDTIME   levothyroxine (SYNTHROID) 50 MCG tablet Take 1 tablet (50 mcg total) by mouth daily before breakfast.   TRELEGY ELLIPTA 100-62.5-25 MCG/ACT AEPB INHALE ONE PUFF BY MOUTH  DAILY   No facility-administered encounter medications on file as of 03/12/2022.    Allergies as of 03/12/2022 - Review Complete 03/12/2022  Allergen Reaction Noted   Codeine Swelling 02/19/2018    Past Medical History:  Diagnosis Date   A-fib [facility])    COPD (chronic obstructive pulmonary disease) ([place])    Hyperlipidemia    Hypertension     Past Surgical History:  Procedure Laterality Date   CAROTID ANGIOGRAM      No family history on file.  Social History   Socioeconomic History   Marital status: Single    Spouse name: Not on file   Number of children: Not on file   Years of education: Not on file   Highest education level: Not on file  Occupational History   Not on file  Tobacco Use   Smoking status: Former    Packs/day: 0.25    Years: 50.00    Total pack years: 12.50    Types: Cigarettes    Quit date: 04/18/2021    Years since quitting: 0.8   Smokeless tobacco: Never  Substance and Sexual Activity   Alcohol use: Not Currently   Drug use: Never   Sexual activity: Not on file  Other Topics Concern   Not on file  Social History Narrative   Not on file   Social Determinants of Health   Financial Resource Strain: Low Risk  (02/05/2020)  Overall Financial Resource Strain (CARDIA)    Difficulty of Paying Living Expenses: Not hard at all  Food Insecurity: No Food Insecurity (10/04/2020)   Hunger Vital Sign    Worried About Running Out of Food in the Last Year: Never true    Ran Out of Food in the Last Year: Never true  Transportation Needs: No Transportation Needs (02/05/2020)   PRAPARE - Administrator, Civil Service (Medical): No    Lack of Transportation (Non-Medical): No  Physical Activity: Sufficiently Active (10/04/2020)   Exercise Vital Sign    Days of Exercise per Week: 7 days    Minutes of Exercise per Session: 30 min  Stress: No Stress Concern Present (10/04/2020)   [employer] of Occupational Health - Occupational Stress  Questionnaire    Feeling of Stress : Not at all  Social Connections: Moderately Isolated (10/04/2020)   Social Connection and Isolation Panel [NHANES]    Frequency of Communication with Friends and Family: More than three times a week    Frequency of Social Gatherings with Friends and Family: Once a week    Attends Religious Services: Never    Database administrator or Organizations: Yes    Attends Banker Meetings: 1 to 4 times per year    Marital Status: Widowed  Intimate Partner Violence: Not At Risk (10/04/2020)   Humiliation, Afraid, Rape, and Kick questionnaire    Fear of Current or Ex-Partner: No    Emotionally Abused: No    Physically Abused: No    Sexually Abused: No    Review of Systems  Constitutional: Negative.   HENT: Negative.    Eyes: Negative.   Respiratory:  Positive for shortness of breath and wheezing.   Cardiovascular: Negative.   All other systems reviewed and are negative.   Vitals:   03/12/22 1133  BP: 106/68  Pulse: 81  Temp: 98.3 F (36.8 C)  SpO2: 99%     Physical Exam Constitutional:      Appearance: Normal appearance. She is well-developed.  HENT:     Head: Normocephalic and atraumatic.  Eyes:     General:        Right eye: No discharge.        Left eye: No discharge.     Pupils: Pupils are equal, round, and reactive to light.  Neck:     Thyroid: No thyromegaly.     Trachea: No tracheal deviation.  Cardiovascular:     Rate and Rhythm: Normal rate and regular rhythm.  Pulmonary:     Effort: Pulmonary effort is normal. No respiratory distress.     Breath sounds: No stridor. No wheezing, rhonchi or rales.  Musculoskeletal:     Cervical back: No rigidity.  Neurological:     Mental Status: She is alert.    Data Reviewed: Records from previous primary physician reviewed Had a CT scan done in 2019 showing some granulomatous disease-stable from previous No PFT  BiPAP compliance not available but uses it  nightly  Assessment:   Chronic respiratory failure Obstructive lung disease -Continue oxygen supplementation -Continue BiPAP at night  Continue Trelegy  Managed to quit smoking  Atrial fibrillation -Controlled   Plan/Recommendations:  Continue BiPAP  Continue oxygen supplementation  Continue graded exercise as tolerated  I will see her back in about 6 months  Encouraged to call with any significant concerns  Virl Diamond MD [facility] 03/12/2022, 11:45 AM  CC: Mliss Sax,*

## 2022-03-12 NOTE — Patient Instructions (Signed)
I will see you 6 months from here  Continue Trelegy  Continue regular exercises  Continue oxygen supplementation  Call us with significant concerns  Continue your BiPAP at night

## 2022-03-13 LAB — URINE CULTURE
MICRO NUMBER:: 13955945
SPECIMEN QUALITY:: ADEQUATE

## 2022-03-13 LAB — HOUSE ACCOUNT TRACKING

## 2022-03-14 ENCOUNTER — Other Ambulatory Visit: Payer: Self-pay | Admitting: Family Medicine

## 2022-03-14 DIAGNOSIS — Z1231 Encounter for screening mammogram for malignant neoplasm of breast: Secondary | ICD-10-CM

## 2022-03-17 DIAGNOSIS — J449 Chronic obstructive pulmonary disease, unspecified: Secondary | ICD-10-CM | POA: Diagnosis not present

## 2022-03-19 ENCOUNTER — Encounter: Payer: Self-pay | Admitting: Pulmonary Disease

## 2022-03-20 ENCOUNTER — Other Ambulatory Visit: Payer: Medicare Other

## 2022-03-20 DIAGNOSIS — R8290 Unspecified abnormal findings in urine: Secondary | ICD-10-CM | POA: Diagnosis not present

## 2022-03-20 DIAGNOSIS — N3000 Acute cystitis without hematuria: Secondary | ICD-10-CM

## 2022-03-21 ENCOUNTER — Other Ambulatory Visit: Payer: Self-pay | Admitting: Cardiology

## 2022-03-23 LAB — URINE CULTURE
MICRO NUMBER:: 14000798
SPECIMEN QUALITY:: ADEQUATE

## 2022-03-26 ENCOUNTER — Telehealth: Payer: Self-pay | Admitting: Cardiology

## 2022-03-26 ENCOUNTER — Telehealth: Payer: Self-pay | Admitting: Family Medicine

## 2022-03-26 MED ORDER — SULFAMETHOXAZOLE-TRIMETHOPRIM 800-160 MG PO TABS: 1.0000 | ORAL_TABLET | Freq: Two times a day (BID) | ORAL | 0 refills | Status: AC

## 2022-03-26 MED ORDER — DILTIAZEM HCL ER COATED BEADS 120 MG PO CP24: 120.0000 mg | ORAL_CAPSULE | Freq: Every day | ORAL | 0 refills | Status: DC

## 2022-03-26 NOTE — Telephone Encounter (Signed)
Pt is wanting a cb concerning her most recent urine culture, please advise pt @ [phone]

## 2022-03-26 NOTE — Telephone Encounter (Signed)
Patient has an appointment 07/05/2022   *STAT* If patient is at the pharmacy, call can be transferred to refill team.   1. Which medications need to be refilled? (please list name of each medication and dose if known)  diltiazem (CARDIZEM CD) 120 MG 24 hr capsule  2. Which pharmacy/location (including street and city if local pharmacy) is medication to be sent to? [facility] [id number] - [place], [place]  3. Do they need a 30 day or 90 day supply? 90 day  Patient has an appointment 07/05/2022

## 2022-03-28 ENCOUNTER — Telehealth: Payer: Self-pay

## 2022-03-28 DIAGNOSIS — M545 Low back pain, unspecified: Secondary | ICD-10-CM | POA: Diagnosis not present

## 2022-03-28 NOTE — Telephone Encounter (Signed)
Spoke with patient went over labs patient would like to know if she should come back in for a repeat urine after she finishes the antibiotics? Please advise.

## 2022-03-29 ENCOUNTER — Other Ambulatory Visit: Payer: Self-pay

## 2022-03-29 DIAGNOSIS — Z0000 Encounter for general adult medical examination without abnormal findings: Secondary | ICD-10-CM

## 2022-03-29 NOTE — Telephone Encounter (Signed)
Patient aware to come back after antibiotics for repeat U/A and culture. Orders placed

## 2022-03-29 NOTE — Telephone Encounter (Signed)
Patient aware of labs.  

## 2022-03-30 ENCOUNTER — Other Ambulatory Visit: Payer: Self-pay | Admitting: Family Medicine

## 2022-03-30 DIAGNOSIS — N390 Urinary tract infection, site not specified: Secondary | ICD-10-CM

## 2022-04-02 ENCOUNTER — Encounter: Payer: Self-pay | Admitting: Family Medicine

## 2022-04-03 ENCOUNTER — Telehealth: Payer: Self-pay | Admitting: Family Medicine

## 2022-04-03 MED ORDER — SULFAMETHOXAZOLE-TRIMETHOPRIM 800-160 MG PO TABS: 1.0000 | ORAL_TABLET | Freq: Two times a day (BID) | ORAL | 0 refills | Status: AC

## 2022-04-03 NOTE — Addendum Note (Signed)
Addended by: Abelino Derrick A on: 04/03/2022 12:05 PM   Modules accepted: Orders

## 2022-04-03 NOTE — Telephone Encounter (Signed)
Caller Name: Nancy Arvin Call back phone #: [phone]  Reason for Call: Michela Pitcher Dr. Ethelene Hal added a new prescription to her list. Can you please call pt to explain why it was added and what it is meant to do.

## 2022-04-04 NOTE — Telephone Encounter (Signed)
Spoke with patient who states that she have not come in to give repeat urine sample after completing antibiotics. Appointment scheduled for lab. Patient aware to hold on medication that was sent in.

## 2022-04-05 ENCOUNTER — Other Ambulatory Visit (INDEPENDENT_AMBULATORY_CARE_PROVIDER_SITE_OTHER): Payer: Medicare Other

## 2022-04-05 DIAGNOSIS — Z0000 Encounter for general adult medical examination without abnormal findings: Secondary | ICD-10-CM

## 2022-04-05 DIAGNOSIS — N390 Urinary tract infection, site not specified: Secondary | ICD-10-CM | POA: Diagnosis not present

## 2022-04-05 DIAGNOSIS — R8290 Unspecified abnormal findings in urine: Secondary | ICD-10-CM | POA: Diagnosis not present

## 2022-04-05 LAB — URINALYSIS, ROUTINE W REFLEX MICROSCOPIC
Bilirubin Urine: NEGATIVE
Hgb urine dipstick: NEGATIVE
Ketones, ur: NEGATIVE
Nitrite: NEGATIVE
RBC / HPF: NONE SEEN (ref 0–?)
Specific Gravity, Urine: 1.02 (ref 1.000–1.030)
Total Protein, Urine: NEGATIVE
Urine Glucose: NEGATIVE
Urobilinogen, UA: 1 (ref 0.0–1.0)
pH: 6.5 (ref 5.0–8.0)

## 2022-04-06 ENCOUNTER — Other Ambulatory Visit: Payer: Self-pay | Admitting: Pulmonary Disease

## 2022-04-06 LAB — URINE CULTURE
MICRO NUMBER:: 14073990
Result:: NO GROWTH
SPECIMEN QUALITY:: ADEQUATE

## 2022-04-11 DIAGNOSIS — M545 Low back pain, unspecified: Secondary | ICD-10-CM | POA: Diagnosis not present

## 2022-04-15 NOTE — Progress Notes (Unsigned)
Cardiology Office Note:    Date:  04/16/2022   ID:  Tiffany Jennings, DOB 17-Jul-1934, MRN [id number]  PCP:  Mliss Sax, MD  Cardiologist:  None  Electrophysiologist:  None   Referring MD: Mliss Sax   No chief complaint on file.   History of Present Illness:    Tiffany Jennings is a 87 y.o. female with a hx of atrial fibrillation on Eliquis, COPD on oxygen, hypertension, hypothyroidism, hyperlipidemia, DVT/PE who presents for follow-up.  She was initially seen on 03/30/2019 for evaluation of atrial fibrillation.  She had moved to the area from [facility]  Was in hospital for pneumonia several years ago and told she had AF.   She denies any symptoms from atrial fibrillation, and does not think she has had any AF since her hospitalization.  She has been on amiodarone 100 mg daily.  The notes from her PCP in [place] indicate that amiodarone was to be discontinued due to her chronic respiratory disease.  However she continued to take amiodarone.  She underwent a TTE in July 2020, which showed normal EF, mild concentric hypertrophy, moderate diastolic dysfunction, no significant valvular disease reported.  She has been on Eliquis and diltiazem for her AF.  Amiodarone was discontinued at initial clinic visit in 03/30/2019.  Zio patch x14 days on 07/29/2019 showed very frequent PACs (18.1% of beats) with numerous episodes of SVT, longest lasting 7 beats.  No evidence of atrial fibrillation.  Echocardiogram on 08/12/2019 showed normal biventricular function, no significant valvular disease.  Had appointment on 08/16/2020 and noted to have significant orthostatic hypotension.  Her Lasix and diltiazem were discontinued.  Subsequently presented on 3/8 with atrial flutter, rate 150 bpm.  Diltiazem was restarted and repeat EKG on 3/9 showed rates had improved to 80s.  At follow-up visit on 4/1 she was back in sinus rhythm.  Zio patch x7 days on 10/07/2020 showed 2564 episodes of SVT,  longest lasting 15 beats and frequent PACs (10.3% of beats), supraventricular couplets (16.1%), and triplets (6.6%).  Echocardiogram 01/26/2021 showed normal biventricular function, grade 1 diastolic dysfunction, no significant valvular disease.  Quit smoking.  Since last clinic visit, she reports she has been doing well.  Continues to have some dyspnea with exertion.  She goes to the gym 5 days/week, does rowing machine and tries to walk 3000 steps per day.  She denies any chest pain, lower extremity edema, or palpitations.  She reports some lightheadedness with standing but no syncope.  She is on Eliquis, denies any bleeding issues.  Reports she quit smoking 04/2021.  Wt Readings from Last 3 Encounters:  04/16/22 178 lb 9.6 oz (81 kg)  03/12/22 176 lb (79.8 kg)  03/08/22 176 lb 3.2 oz (79.9 kg)     Past Medical History:  Diagnosis Date   A-fib (HCC)    COPD (chronic obstructive pulmonary disease) (HCC)    Hyperlipidemia    Hypertension     Past Surgical History:  Procedure Laterality Date   CAROTID ANGIOGRAM      Current Medications: Current Meds  Medication Sig   apixaban (ELIQUIS) 5 MG TABS tablet Take 1 tablet (5 mg total) by mouth 2 (two) times daily.   atorvastatin (LIPITOR) 20 MG tablet TAKE ONE TABLET BY MOUTH DAILY   carboxymethylcellulose (REFRESH PLUS) 0.5 % SOLN 1 drop 3 (three) times daily as needed.   diltiazem (CARDIZEM CD) 120 MG 24 hr capsule Take 1 capsule (120 mg total) by mouth daily.   donepezil (ARICEPT)  10 MG tablet TAKE ONE TABLET BY MOUTH EVERY NIGHT AT BEDTIME   imipramine (TOFRANIL) 50 MG tablet TAKE ONE TO TWO TABLETS BY MOUTH EVERY NIGHT AT BEDTIME   levothyroxine (SYNTHROID) 50 MCG tablet Take 1 tablet (50 mcg total) by mouth daily before breakfast.   TRELEGY ELLIPTA 100-62.5-25 MCG/ACT AEPB INHALE ONE PUFF BY MOUTH DAILY     Allergies:   Codeine   Social History   Socioeconomic History   Marital status: Single    Spouse name: Not on file    Number of children: Not on file   Years of education: Not on file   Highest education level: Not on file  Occupational History   Not on file  Tobacco Use   Smoking status: Former    Packs/day: 0.25    Years: 50.00    Total pack years: 12.50    Types: Cigarettes    Quit date: 04/18/2021    Years since quitting: 0.9   Smokeless tobacco: Never  Substance and Sexual Activity   Alcohol use: Not Currently   Drug use: Never   Sexual activity: Not on file  Other Topics Concern   Not on file  Social History Narrative   Not on file   Social Determinants of Health   Financial Resource Strain: Low Risk  (02/05/2020)   Overall Financial Resource Strain (CARDIA)    Difficulty of Paying Living Expenses: Not hard at all  Food Insecurity: No Food Insecurity (10/04/2020)   Hunger Vital Sign    Worried About Running Out of Food in the Last Year: Never true    Ran Out of Food in the Last Year: Never true  Transportation Needs: No Transportation Needs (02/05/2020)   PRAPARE - Administrator, Civil Service (Medical): No    Lack of Transportation (Non-Medical): No  Physical Activity: Sufficiently Active (10/04/2020)   Exercise Vital Sign    Days of Exercise per Week: 7 days    Minutes of Exercise per Session: 30 min  Stress: No Stress Concern Present (10/04/2020)   [employer] of Occupational Health - Occupational Stress Questionnaire    Feeling of Stress : Not at all  Social Connections: Moderately Isolated (10/04/2020)   Social Connection and Isolation Panel [NHANES]    Frequency of Communication with Friends and Family: More than three times a week    Frequency of Social Gatherings with Friends and Family: Once a week    Attends Religious Services: Never    Database administrator or Organizations: Yes    Attends Banker Meetings: 1 to 4 times per year    Marital Status: Widowed     Family History: No relevant family history of heart disease  ROS:   Please  see the history of present illness.    All other systems reviewed and are negative.  EKGs/Labs/Other Studies Reviewed:    The following studies were reviewed today:  ECHO 08/12/2019:   IMPRESSIONS:  1. Left ventricular ejection fraction, by estimation, is 55 to 60%. The  left ventricle has normal function. The left ventricle has no regional  wall motion abnormalities. There is mild concentric left ventricular  hypertrophy. Left ventricular diastolic  function could not be evaluated.   2. Right ventricular systolic function is normal. The right ventricular  size is normal. Tricuspid regurgitation signal is inadequate for assessing  PA pressure.   3. The mitral valve is normal in structure and function. Trivial mitral  valve regurgitation. No evidence of  mitral stenosis.   4. The aortic valve is tricuspid. Aortic valve regurgitation is not  visualized. No aortic stenosis is present.   5. The inferior vena cava is normal in size with greater than 50%  respiratory variability, suggesting right atrial pressure of 3 mmHg.   10/07/20: LONG TERM MONITOR  Study Highlights   Very frequent PACs (18.1% of beats), with numerous episodes of SVT (longest lasting 7 beats) No atrial fibrillation   14 days of data recorded on Zio monitor. Patient had a min HR of 50 bpm, max HR of 176 bpm, and avg HR of 68 bpm. Predominant underlying rhythm was Sinus Rhythm. No VT, atrial fibrillation, high degree block, or pauses noted. 926 episodes of SVT, longest lasting 7 beats.  Isolated ventricular ectopy was rare (<1%). Very frequent PACs (18.1% of beats).  There were 7 triggered events, which corresponded to sinus rhythm plus PACs.     04/13/2019: vas us le artery seg  Summary:  Right: Resting right ankle-brachial index is within normal range. No  evidence of significant right lower extremity arterial disease. The right  toe-brachial index is normal.   Left: Resting left ankle-brachial index is within  normal range. No  evidence of significant left lower extremity arterial disease. The left  toe-brachial index is abnormal.   EKG:   04/16/22: Sinus rhythm, rate 83, frequent PACs, left axis deviation 01/04/21:sinus rhythm with PACs rate 78 bpm,  09/16/20-Sinus with PACs, rate 74 bpm   08/23/20-Atrial flutter, rate 152 bpm  Recent Labs: 03/08/2022: ALT 18; BUN 29; Creatinine, Ser 1.17; Hemoglobin 11.0; Platelets 345.0; Potassium 4.7; Sodium 138; TSH 2.76  Recent Lipid Panel    Component Value Date/Time   CHOL 138 03/08/2022 1157   CHOL 177 06/27/2020 1145   TRIG 40.0 03/08/2022 1157   HDL 76.00 03/08/2022 1157   HDL 82 06/27/2020 1145   CHOLHDL 2 03/08/2022 1157   VLDL 8.0 03/08/2022 1157   LDLCALC 54 03/08/2022 1157   LDLCALC 82 06/27/2020 1145   LDLDIRECT 86 03/06/2019 1142    Physical Exam:    VS:  BP 118/68   Pulse 81   Ht 5\' 6"  (1.676 m)   Wt 178 lb 9.6 oz (81 kg)   SpO2 93%   BMI 28.83 kg/m     Wt Readings from Last 3 Encounters:  04/16/22 178 lb 9.6 oz (81 kg)  03/12/22 176 lb (79.8 kg)  03/08/22 176 lb 3.2 oz (79.9 kg)     GEN:  in no acute distress, on oxygen HEENT: Normal NECK: No JVD CARDIAC: Irregular rhythm, normal rate, no murmurs, rubs, gallops RESPIRATORY: no wheezing ABDOMEN: Soft, non-tender, non-distended MUSCULOSKELETAL:  No edema; No deformity  SKIN: Warm and dry NEUROLOGIC:  Alert and oriented x 3 PSYCHIATRIC:  Normal affect   ASSESSMENT:    1. Paroxysmal atrial fibrillation (HCC)   2. Orthostatic hypotension   3. PAC (premature atrial contraction)   4. Chronic diastolic heart failure (HCC)   5. Essential hypertension   6. Hyperlipidemia, unspecified hyperlipidemia type      PLAN:    Atrial fibrillation/flutter: On diltiazem 120 mg and Eliquis 5 mg twice daily.  CHADS-VASc 4 (HTN, agex2, female).  TTE from 08/12/19 shows normal biventricular function, no significant valvular disease.  Amiodarone discontinued 03/2019 due to risk of  pulmonary toxicity given her COPD.  Zio patch x14 days on 07/29/2019 showed very frequent PACs (18.1% of beats) with numerous episodes of SVT, longest lasting 7 beats.  No evidence  of atrial fibrillation.  Had recurrence of atrial flutter on 3/8 while diltiazem was held due to orthostatic hypotension, rates up to 150s.  Rate improved to 80s with restarting diltiazem and she spontaneously converted to sinus rhythm.  Zio patch x7 days on 10/07/2020 showed no Afib but 2564 episodes of SVT, longest lasting 15 beats and frequent PACs (10.3% of beats), supraventricular couplets (16.1%), and triplets (6.6%).  Echocardiogram 01/26/2021 showed normal biventricular function, grade 1 diastolic dysfunction, no significant valvular disease. -Continue Eliquis 5 mg twice daily -Continue diltiazem 120 mg daily  Orthostatic hypotension: BP dropped from 133/68 lying to 87/46 at clinic appointment on 08/19/20.  She had been taking Lasix 20 mg 3 times weekly, have discontinued.   Diltiazem has been held but was restarted given her atrial flutter with RVR.  Suspect due to Lasix use, has been discontinued.  Appears improved.  Advised to stay hydrated and stand slowly  Frequent PACs/SVT: Zio patch x14 days on 07/29/2019 showed very frequent PACs (18.1% of beats) with numerous episodes of SVT, longest lasting 7 beats.  Continue diltiazem.  TSH was low, hypothyroidism may have been contributing to increased ectopy, her levothyroxine dose was decreased.  TSH normal on 06/27/2020.  Zio patch x7 days on 10/07/2020 showed no Afib but 2564 episodes of SVT, longest lasting 15 beats and frequent PACs (10.3% of beats), supraventricular couplets (16.1%), and triplets (6.6%). -Continue diltiazem as above  Chronic diastolic heart failure: Was on Lasix 20 mg 3 times weekly.  Discontinued Lasix due to orthostatic hypotension as above.  Hypertension: Presented with orthostatic hypotension, held diltiazem but restarted for atrial flutter as above.   Appears controlled.  Hyperlipidemia: On atorvastatin 10 mg.  LDL 105 on 12/28/2019, atorvastatin increased to 20 mg daily.  LDL 82 on 06/27/2020  Tobacco use: Congratulated patient on quitting smoking encouraged continued cessation  Leg pain: ABIs normal, no evidence of PAD  COPD: on home oxygen.  Follows with pulmonology.   RTC in 6 months   Medication Adjustments/Labs and Tests Ordered: Current medicines are reviewed at length with the patient today.  Concerns regarding medicines are outlined above.  Orders Placed This Encounter  Procedures   EKG 12-Lead    No orders of the defined types were placed in this encounter.    Patient Instructions  Medication Instructions:  Your physician recommends that you continue on your current medications as directed. Please refer to the Current Medication list given to you today.  *If you need a refill on your cardiac medications before your next appointment, please call your pharmacy*  Follow-Up: At [facility], you and your health needs are our priority.  As part of our continuing mission to provide you with exceptional heart care, we have created designated Provider Care Teams.  These Care Teams include your primary Cardiologist (physician) and Advanced Practice Providers (APPs -  Physician Assistants and Nurse Practitioners) who all work together to provide you with the care you need, when you need it.  We recommend signing up for the patient portal called "MyChart".  Sign up information is provided on this After Visit Summary.  MyChart is used to connect with patients for Virtual Visits (Telemedicine).  Patients are able to view lab/test results, encounter notes, upcoming appointments, etc.  Non-urgent messages can be sent to your provider as well.   To learn more about what you can do with MyChart, go to [email or web address].    Your next appointment:   6 month(s)  The format for your  next appointment:   In  Person  Provider:   Dr. Gardiner Rhyme           Signed, Donato Heinz, MD  04/16/2022 8:13 PM    [place]

## 2022-04-16 ENCOUNTER — Encounter: Payer: Self-pay | Admitting: Cardiology

## 2022-04-16 ENCOUNTER — Ambulatory Visit: Payer: Medicare Other | Attending: Cardiology | Admitting: Cardiology

## 2022-04-16 ENCOUNTER — Telehealth: Payer: Self-pay | Admitting: Cardiology

## 2022-04-16 VITALS — BP 118/68 | HR 81 | Ht 66.0 in | Wt 178.6 lb

## 2022-04-16 DIAGNOSIS — I951 Orthostatic hypotension: Secondary | ICD-10-CM | POA: Diagnosis not present

## 2022-04-16 DIAGNOSIS — E785 Hyperlipidemia, unspecified: Secondary | ICD-10-CM | POA: Diagnosis not present

## 2022-04-16 DIAGNOSIS — I480 Paroxysmal atrial fibrillation: Secondary | ICD-10-CM

## 2022-04-16 DIAGNOSIS — I491 Atrial premature depolarization: Secondary | ICD-10-CM

## 2022-04-16 DIAGNOSIS — I10 Essential (primary) hypertension: Secondary | ICD-10-CM

## 2022-04-16 DIAGNOSIS — I5032 Chronic diastolic (congestive) heart failure: Secondary | ICD-10-CM | POA: Diagnosis not present

## 2022-04-16 NOTE — Telephone Encounter (Signed)
Returned pt's call. No answer. Left msg to call back 

## 2022-04-16 NOTE — Patient Instructions (Signed)
Medication Instructions:  Your physician recommends that you continue on your current medications as directed. Please refer to the Current Medication list given to you today.  *If you need a refill on your cardiac medications before your next appointment, please call your pharmacy*  Follow-Up: At [facility], you and your health needs are our priority.  As part of our continuing mission to provide you with exceptional heart care, we have created designated Provider Care Teams.  These Care Teams include your primary Cardiologist (physician) and Advanced Practice Providers (APPs -  Physician Assistants and Nurse Practitioners) who all work together to provide you with the care you need, when you need it.  We recommend signing up for the patient portal called "MyChart".  Sign up information is provided on this After Visit Summary.  MyChart is used to connect with patients for Virtual Visits (Telemedicine).  Patients are able to view lab/test results, encounter notes, upcoming appointments, etc.  Non-urgent messages can be sent to your provider as well.   To learn more about what you can do with MyChart, go to [email or web address].    Your next appointment:   6 month(s)  The format for your next appointment:   In Person  Provider:   Dr. Schumann       

## 2022-04-16 NOTE — Telephone Encounter (Signed)
Patient has questions regarding a [employer]ardia mobile device.

## 2022-04-17 ENCOUNTER — Telehealth: Payer: Self-pay

## 2022-04-17 DIAGNOSIS — J449 Chronic obstructive pulmonary disease, unspecified: Secondary | ICD-10-CM | POA: Diagnosis not present

## 2022-04-17 NOTE — Telephone Encounter (Signed)
Spoke with patient and daughter per daughter she is very frustrated at the length of time it has taken for them to receive a call regarding labs. Apologized for the delay explained to patient and daughter that labs have not not been viewed at this time but we will give them a call as soon as results are available. Please advise.

## 2022-04-17 NOTE — Telephone Encounter (Signed)
Pt checking on status of this message.  °

## 2022-04-17 NOTE — Telephone Encounter (Signed)
Pt is wanting a cb concerning her most recent Urinalysis, Routine w reflex microscopic. Please advise pt @ [phone]

## 2022-04-18 ENCOUNTER — Ambulatory Visit
Admission: RE | Admit: 2022-04-18 | Discharge: 2022-04-18 | Disposition: A | Discharge status: Home / Self Care | Payer: Medicare Other | Source: Ambulatory Visit | Attending: Family Medicine | Admitting: Family Medicine

## 2022-04-18 DIAGNOSIS — Z1231 Encounter for screening mammogram for malignant neoplasm of breast: Secondary | ICD-10-CM | POA: Diagnosis not present

## 2022-04-18 NOTE — Telephone Encounter (Signed)
Patient and daughter aware of negative results.

## 2022-04-25 DIAGNOSIS — M545 Low back pain, unspecified: Secondary | ICD-10-CM | POA: Diagnosis not present

## 2022-04-25 NOTE — Telephone Encounter (Signed)
Returned call to patient and all questions answered regarding [facility] device.   Advised patient to call back to office with any issues, questions, or concerns. Patient verbalized understanding.

## 2022-04-25 NOTE — Telephone Encounter (Signed)
Attempted to call patient, unable to reach. Left message with call back number to call back if assistance is still needed.

## 2022-04-25 NOTE — Telephone Encounter (Signed)
Patient returned call

## 2022-05-01 ENCOUNTER — Other Ambulatory Visit: Payer: Self-pay | Admitting: Family Medicine

## 2022-05-01 DIAGNOSIS — F339 Major depressive disorder, recurrent, unspecified: Secondary | ICD-10-CM

## 2022-05-09 DIAGNOSIS — M545 Low back pain, unspecified: Secondary | ICD-10-CM | POA: Diagnosis not present

## 2022-05-17 DIAGNOSIS — J449 Chronic obstructive pulmonary disease, unspecified: Secondary | ICD-10-CM | POA: Diagnosis not present

## 2022-05-23 DIAGNOSIS — M545 Low back pain, unspecified: Secondary | ICD-10-CM | POA: Diagnosis not present

## 2022-05-25 ENCOUNTER — Telehealth: Payer: Self-pay | Admitting: Cardiology

## 2022-05-25 ENCOUNTER — Other Ambulatory Visit: Payer: Self-pay | Admitting: Family Medicine

## 2022-05-25 NOTE — Telephone Encounter (Signed)
Patient called in as over the past day her HR has been up and down from the 60's-112 on her pulse ox device. She is wearing one on each hand and was getting the various reading of her HR. Explained to patient that she has a history of A.Fib and she explained that she is not having any symptoms and has been taking all medications correctly and as ordered. She only has some SOB with exercise, that resides when she stops.  Encouraged to listen to her body with exercise and stop if this increases, to call and schedule an appointment if SOB becomes consistent outside activity. No additional questions at this time.

## 2022-05-25 NOTE — Telephone Encounter (Signed)
Again, patient is following up requesting that her call is returned as soon as possible.

## 2022-05-25 NOTE — Telephone Encounter (Signed)
STAT if HR is under 50 or over 120 (normal HR is 60-100 beats per minute)  What is your heart rate? 74  Do you have a log of your heart rate readings (document readings)?  95 85 115   Do you have any other symptoms? No Pt states that HR has been up. She would like a callback regarding this matter.

## 2022-05-25 NOTE — Telephone Encounter (Signed)
Patient is following up requesting a call back soon.

## 2022-05-28 DIAGNOSIS — Z961 Presence of intraocular lens: Secondary | ICD-10-CM | POA: Diagnosis not present

## 2022-05-28 DIAGNOSIS — H18453 Nodular corneal degeneration, bilateral: Secondary | ICD-10-CM | POA: Diagnosis not present

## 2022-05-28 DIAGNOSIS — H16223 Keratoconjunctivitis sicca, not specified as Sjogren's, bilateral: Secondary | ICD-10-CM | POA: Diagnosis not present

## 2022-06-01 ENCOUNTER — Ambulatory Visit: Payer: Medicare Other | Admitting: Nurse Practitioner

## 2022-06-04 ENCOUNTER — Telehealth: Payer: Self-pay | Admitting: Family Medicine

## 2022-06-04 NOTE — Telephone Encounter (Signed)
Left message for patient to call back and schedule Medicare Annual Wellness Visit (AWV) in office.  ? ?If not able to come in office, please offer to do virtually or by telephone.  Left office number and my jabber #[phone]. ? ?Last AWV:10/04/2020 ? ?Please schedule at anytime with Nurse Health Advisor. ?  ?

## 2022-06-17 DIAGNOSIS — J449 Chronic obstructive pulmonary disease, unspecified: Secondary | ICD-10-CM | POA: Diagnosis not present

## 2022-06-22 ENCOUNTER — Telehealth: Payer: Self-pay | Admitting: Family Medicine

## 2022-06-22 NOTE — Telephone Encounter (Signed)
Pt is wondering when she can be around her grand daughter. She had covid and her temperature went away five days ago. Please contact pt at [phone]

## 2022-06-22 NOTE — Telephone Encounter (Signed)
Returned patients call no answer LM on identified VM informed patient she should be okay to be around granddaughter after another five days with no symptoms. Asked to call back with any questions.

## 2022-07-01 ENCOUNTER — Other Ambulatory Visit: Payer: Self-pay | Admitting: Cardiology

## 2022-07-05 ENCOUNTER — Ambulatory Visit: Payer: Medicare Other | Admitting: Cardiology

## 2022-07-05 DIAGNOSIS — M545 Low back pain, unspecified: Secondary | ICD-10-CM | POA: Diagnosis not present

## 2022-07-06 ENCOUNTER — Ambulatory Visit (INDEPENDENT_AMBULATORY_CARE_PROVIDER_SITE_OTHER): Payer: Medicare Other

## 2022-07-06 VITALS — Ht 66.0 in | Wt 172.0 lb

## 2022-07-06 DIAGNOSIS — Z0000 Encounter for general adult medical examination without abnormal findings: Secondary | ICD-10-CM

## 2022-07-06 NOTE — Progress Notes (Signed)
I connected with Tiffany Jennings today by telephone and verified that I am speaking with the correct person using two identifiers. Location patient: home Location provider: work Persons participating in the virtual visit: Tiffany Jennings, [place] LPN.   I discussed the limitations, risks, security and privacy concerns of performing an evaluation and management service by telephone and the availability of in person appointments. I also discussed with the patient that there may be a patient responsible charge related to this service. The patient expressed understanding and verbally consented to this telephonic visit.    Interactive audio and video telecommunications were attempted between this provider and patient, however failed, due to patient having technical difficulties OR patient did not have access to video capability.  We continued and completed visit with audio only.     Vital signs may be patient reported or missing.  Subjective:   Tiffany Jennings is a 88 y.o. female who presents for Medicare Annual (Subsequent) preventive examination.  Review of Systems     Cardiac Risk Factors include: advanced age (>46men, >80 women);hypertension     Objective:    Today's Vitals   07/06/22 1117  Weight: 172 lb (78 kg)  Height: 5\' 6"  (1.676 m)   Body mass index is 27.76 kg/m.     07/06/2022   11:23 AM 10/04/2020    1:41 PM 09/30/2019   11:04 AM 07/06/2019   11:37 AM  Advanced Directives  Does Patient Have a Medical Advance Directive? Yes Yes Yes Yes  Type of 07/08/2019 of [place];Living will Healthcare Power of [place];Living will Healthcare Power of [place];Living will  Does patient want to make changes to medical advance directive?   No - Patient declined   Copy of Healthcare Power of Attorney in Chart? No - copy requested No - copy requested No - copy requested No - copy requested    Current Medications  (verified) Outpatient Encounter Medications as of 07/06/2022  Medication Sig   apixaban (ELIQUIS) 5 MG TABS tablet Take 1 tablet (5 mg total) by mouth 2 (two) times daily.   atorvastatin (LIPITOR) 20 MG tablet TAKE ONE TABLET BY MOUTH DAILY   carboxymethylcellulose (REFRESH PLUS) 0.5 % SOLN 1 drop 3 (three) times daily as needed.   diltiazem (CARDIZEM CD) 120 MG 24 hr capsule TAKE 1 CAPSULE BY MOUTH DAILY ** KEEP UPCOMING APPOINTMENT FOR FUTURE REFILLS**   donepezil (ARICEPT) 10 MG tablet TAKE ONE TABLET BY MOUTH EVERY NIGHT AT BEDTIME   imipramine (TOFRANIL) 50 MG tablet TAKE ONE TO TWO TABLETS BY MOUTH EVERY NIGHT AT BEDTIME   levothyroxine (SYNTHROID) 50 MCG tablet Take 1 tablet (50 mcg total) by mouth daily before breakfast.   TRELEGY ELLIPTA 100-62.5-25 MCG/ACT AEPB INHALE ONE PUFF BY MOUTH DAILY   albuterol (VENTOLIN HFA) 108 (90 Base) MCG/ACT inhaler INHALE 2 PUFFS INTO THE LUNGS EVERY 6 HOURS AS NEEDED FOR WHEEZING OR SHORTNESS OF BREATH (Patient not taking: Reported on 04/16/2022)   No facility-administered encounter medications on file as of 07/06/2022.    Allergies (verified) Codeine   History: Past Medical History:  Diagnosis Date   A-fib (HCC)    COPD (chronic obstructive pulmonary disease) (HCC)    Hyperlipidemia    Hypertension    Past Surgical History:  Procedure Laterality Date   CAROTID ANGIOGRAM     History reviewed. No pertinent family history. Social History   Socioeconomic History   Marital status: Single    Spouse name: Not on  file   Number of children: Not on file   Years of education: Not on file   Highest education level: Not on file  Occupational History   Not on file  Tobacco Use   Smoking status: Former    Packs/day: 0.25    Years: 50.00    Total pack years: 12.50    Types: Cigarettes    Quit date: 04/18/2021    Years since quitting: 1.2   Smokeless tobacco: Never  Vaping Use   Vaping Use: Never used  Substance and Sexual Activity    Alcohol use: Not Currently   Drug use: Never   Sexual activity: Not on file  Other Topics Concern   Not on file  Social History Narrative   Not on file   Social Determinants of Health   Financial Resource Strain: Low Risk  (07/06/2022)   Overall Financial Resource Strain (CARDIA)    Difficulty of Paying Living Expenses: Not hard at all  Food Insecurity: No Food Insecurity (07/06/2022)   Hunger Vital Sign    Worried About Running Out of Food in the Last Year: Never true    [place] in the Last Year: Never true  Transportation Needs: No Transportation Needs (07/06/2022)   PRAPARE - Hydrologist (Medical): No    Lack of Transportation (Non-Medical): No  Physical Activity: Sufficiently Active (07/06/2022)   Exercise Vital Sign    Days of Exercise per Week: 5 days    Minutes of Exercise per Session: 90 min  Stress: No Stress Concern Present (07/06/2022)   [place]    Feeling of Stress : Not at all  Social Connections: Moderately Isolated (10/04/2020)   Social Connection and Isolation Panel [NHANES]    Frequency of Communication with Friends and Family: More than three times a week    Frequency of Social Gatherings with Friends and Family: Once a week    Attends Religious Services: Never    Marine scientist or Organizations: Yes    Attends Archivist Meetings: 1 to 4 times per year    Marital Status: Widowed    Tobacco Counseling Counseling given: Not Answered   Clinical Intake:  Pre-visit preparation completed: Yes  Pain : No/denies pain     Nutritional Status: BMI 25 -29 Overweight Nutritional Risks: None Diabetes: No  How often do you need to have someone help you when you read instructions, pamphlets, or other written materials from your doctor or pharmacy?: 1 - Never  Diabetic? no  Interpreter Needed?: No  Information entered by :: NAllen  LPN   Activities of Daily Living    07/06/2022   11:23 AM  In your present state of health, do you have any difficulty performing the following activities:  Hearing? 1  Comment has hearing aids  Vision? 0  Difficulty concentrating or making decisions? 0  Walking or climbing stairs? 0  Dressing or bathing? 0  Doing errands, shopping? 0  Preparing Food and eating ? N  Using the Toilet? N  In the past six months, have you accidently leaked urine? N  Do you have problems with loss of bowel control? N  Managing your Medications? Y  Managing your Finances? N  Housekeeping or managing your Housekeeping? N    Patient Care Team: Tiffany Maw, MD as PCP - General (Family Medicine)  Indicate any recent Medical Services you may have received from other than  [facility] providers in the past year (date may be approximate).     Assessment:   This is a routine wellness examination for Tiffany Jennings.  Hearing/Vision screen Vision Screening - Comments:: Regular eye exams, [facility]  Dietary issues and exercise activities discussed: Current Exercise Habits: Home exercise routine, Type of exercise: strength training/weights;walking, Time (Minutes): > 60, Frequency (Times/Week): 5, Weekly Exercise (Minutes/Week): 0   Goals Addressed             This Visit's Progress    Patient Stated       07/06/2022, wants to lose 40 pounds       Depression Screen    07/06/2022   11:23 AM 03/08/2022   12:04 PM 08/04/2021    1:14 PM 05/26/2021    2:28 PM 02/06/2021    1:39 PM 02/06/2021   11:04 AM 10/04/2020    1:44 PM  PHQ 2/9 Scores  PHQ - 2 Score 0 0 0 0 0 0 0  PHQ- 9 Score     1      Fall Risk    07/06/2022   11:23 AM 08/04/2021    1:14 PM 05/26/2021    2:28 PM 02/06/2021   11:05 AM 10/04/2020    1:44 PM  Fall Risk   Falls in the past year? 0 0 0 0 0  Number falls in past yr: 0 0 0  0  Injury with Fall? 0    0  Risk for fall due to : Medication side effect      Follow up Falls  prevention discussed;Education provided;Falls evaluation completed    Falls prevention discussed    FALL RISK PREVENTION PERTAINING TO THE HOME:  Any stairs in or around the home? Yes  If so, are there any without handrails? No  Home free of loose throw rugs in walkways, pet beds, electrical cords, etc? Yes  Adequate lighting in your home to reduce risk of falls? Yes   ASSISTIVE DEVICES UTILIZED TO PREVENT FALLS:  Life alert? No  Use of a cane, walker or w/c? Yes  Grab bars in the bathroom? Yes  Shower chair or bench in shower? Yes  Elevated toilet seat or a handicapped toilet? Yes   TIMED UP AND GO:  Was the test performed? No .      Cognitive Function:        07/06/2022   11:24 AM  6CIT Screen  What Year? 0 points  What month? 0 points  What time? 0 points  Count back from 20 0 points  Months in reverse 0 points  Repeat phrase 2 points  Total Score 2 points    Immunizations Immunization History  Administered Date(s) Administered   Fluad Quad(high Dose 65+) 03/06/2019, 03/29/2020   Influenza Whole 02/18/2018   Influenza, High Dose Seasonal PF 05/26/2021, 03/08/2022   PFIZER(Purple Top)SARS-COV-2 Vaccination 08/16/2019, 09/15/2019, 03/01/2020, 06/20/2021   PNEUMOCOCCAL CONJUGATE-20 03/08/2022   Pneumococcal Conjugate-13 06/24/2018    TDAP status: Due, Education has been provided regarding the importance of this vaccine. Advised may receive this vaccine at local pharmacy or Health Dept. Aware to provide a copy of the vaccination record if obtained from local pharmacy or Health Dept. Verbalized acceptance and understanding.  Flu Vaccine status: Up to date  Pneumococcal vaccine status: Up to date  Covid-19 vaccine status: Completed vaccines  Qualifies for Shingles Vaccine? Yes   Zostavax completed No   Shingrix Completed?: No.    Education has been provided regarding the importance of  this vaccine. Patient has been advised to call insurance company to  determine out of pocket expense if they have not yet received this vaccine. Advised may also receive vaccine at local pharmacy or Health Dept. Verbalized acceptance and understanding.  Screening Tests Health Maintenance  Topic Date Due   DTaP/Tdap/Td (1 - Tdap) Never done   Zoster Vaccines- Shingrix (1 of 2) Never done   Medicare Annual Wellness (AWV)  10/04/2021   Pneumonia Vaccine 79+ Years old  Completed   INFLUENZA VACCINE  Completed   DEXA SCAN  Completed   HPV VACCINES  Aged Out   COVID-19 Vaccine  Discontinued    Health Maintenance  Health Maintenance Due  Topic Date Due   DTaP/Tdap/Td (1 - Tdap) Never done   Zoster Vaccines- Shingrix (1 of 2) Never done   Medicare Annual Wellness (AWV)  10/04/2021    Colorectal cancer screening: No longer required.   Mammogram status: Completed 04/18/2022. Repeat every year  Bone Density status: Completed 10/03/2017.   Lung Cancer Screening: (Low Dose CT Chest recommended if Age 8-80 years, 30 pack-year currently smoking OR have quit w/in 15years.) does not qualify.   Lung Cancer Screening Referral: no  Additional Screening:  Hepatitis C Screening: does not qualify;   Vision Screening: Recommended annual ophthalmology exams for early detection of glaucoma and other disorders of the eye. Is the patient up to date with their annual eye exam?  Yes  Who is the provider or what is the name of the office in which the patient attends annual eye exams? [facility] If pt is not established with a provider, would they like to be referred to a provider to establish care? No .   Dental Screening: Recommended annual dental exams for proper oral hygiene  Community Resource Referral / Chronic Care Management: CRR required this visit?  No   CCM required this visit?  No      Plan:     I have personally reviewed and noted the following in the patient's chart:   Medical and social history Use of alcohol, tobacco or illicit drugs   Current medications and supplements including opioid prescriptions. Patient is not currently taking opioid prescriptions. Functional ability and status Nutritional status Physical activity Advanced directives List of other physicians Hospitalizations, surgeries, and ER visits in previous 12 months Vitals Screenings to include cognitive, depression, and falls Referrals and appointments  In addition, I have reviewed and discussed with patient certain preventive protocols, quality metrics, and best practice recommendations. A written personalized care plan for preventive services as well as general preventive health recommendations were provided to patient.     Kellie Simmering, LPN   2/84/1324   Nurse Notes: none  Due to this being a virtual visit, the after visit summary with patients personalized plan was offered to patient via mail or my-chart.  to pick up at office at next visit

## 2022-07-06 NOTE — Patient Instructions (Signed)
Tiffany Jennings , Thank you for taking time to come for your Medicare Wellness Visit. I appreciate your ongoing commitment to your health goals. Please review the following plan we discussed and let me know if I can assist you in the future.   These are the goals we discussed:  Goals      Chronic Care Management     CARE PLAN ENTRY  Current Barriers:  Chronic Disease Management support, education, and care coordination needs related to Hyperlipidemia, COPD, Chronic Kidney Disease, Hypothyroidism, and Pre-Diabetes   Hyperlipidemia Pharmacist Clinical Goal(s): Over the next 90 days, patient will work with PharmD and providers to maintain LDL goal < 100 Current regimen:  Atorvastatin 10 mg   Pre-Diabetes Pharmacist Clinical Goal(s): Over the next 90 days, patient will work with PharmD and providers to prevent progression to Diabetes Interventions: Recommend following a low-carbohydrate diet and incorporating more non-starchy vegetables.  Patient self care activities - Over the next 90 days, patient will: Limit intake of Starchy Foods Work to increase activity to 150 minutes of moderate-intensity exercise weekly  Atrial Fibrillation Pharmacist Clinical Goal(s) Over the next 90 days, patient will work with PharmD and providers to prevent symptoms of heart flutter Current regimen:  Diltiazem 120 mg  Eliquis 5 mg   Medication management Pharmacist Clinical Goal(s): Over the next 90 days, patient will work with PharmD and providers to maintain optimal medication adherence Current pharmacy: [facility] Interventions Comprehensive medication review performed. Continue current medication management strategy. If you have any questions about me helping dispense your medications, let me know.  Patient self care activities - Over the next 90 days, patient will: Take medications as prescribed Report any questions or concerns to PharmD and/or provider(s) Reach out to the [facility] counselors to see  if you qualify for the Extra Help Program to help with medication costs. You can reach them at [phone]     DIET - [place]     Patient Stated     Would like to lose some weight     Patient Stated     07/06/2022, wants to lose 40 pounds        This is a list of the screening recommended for you and due dates:  Health Maintenance  Topic Date Due   DTaP/Tdap/Td vaccine (1 - Tdap) Never done   Zoster (Shingles) Vaccine (1 of 2) Never done   Medicare Annual Wellness Visit  07/07/2023   Pneumonia Vaccine  Completed   Flu Shot  Completed   DEXA scan (bone density measurement)  Completed   HPV Vaccine  Aged Out   COVID-19 Vaccine  Discontinued    Advanced directives: Please bring a copy of your POA (Power of [place]) and/or Living Will to your next appointment.   Conditions/risks identified: none  Next appointment: Follow up in one year for your annual wellness visit    Preventive Care 65 Years and Older, Female Preventive care refers to lifestyle choices and visits with your health care provider that can promote health and wellness. What does preventive care include? A yearly physical exam. This is also called an annual well check. Dental exams once or twice a year. Routine eye exams. Ask your health care provider how often you should have your eyes checked. Personal lifestyle choices, including: Daily care of your teeth and gums. Regular physical activity. Eating a healthy diet. Avoiding tobacco and drug use. Limiting alcohol use. Practicing safe sex. Taking low-dose aspirin every day. Taking vitamin and mineral supplements  as recommended by your health care provider. What happens during an annual well check? The services and screenings done by your health care provider during your annual well check will depend on your age, overall health, lifestyle risk factors, and family history of disease. Counseling  Your health care provider may ask you questions  about your: Alcohol use. Tobacco use. Drug use. Emotional well-being. Home and relationship well-being. Sexual activity. Eating habits. History of falls. Memory and ability to understand (cognition). Work and work Astronomer. Reproductive health. Screening  You may have the following tests or measurements: Height, weight, and BMI. Blood pressure. Lipid and cholesterol levels. These may be checked every 5 years, or more frequently if you are over 39 years old. Skin check. Lung cancer screening. You may have this screening every year starting at age 21 if you have a 30-pack-year history of smoking and currently smoke or have quit within the past 15 years. Fecal occult blood test (FOBT) of the stool. You may have this test every year starting at age 86. Flexible sigmoidoscopy or colonoscopy. You may have a sigmoidoscopy every 5 years or a colonoscopy every 10 years starting at age 65. Hepatitis C blood test. Hepatitis B blood test. Sexually transmitted disease (STD) testing. Diabetes screening. This is done by checking your blood sugar (glucose) after you have not eaten for a while (fasting). You may have this done every 1-3 years. Bone density scan. This is done to screen for osteoporosis. You may have this done starting at age 22. Mammogram. This may be done every 1-2 years. Talk to your health care provider about how often you should have regular mammograms. Talk with your health care provider about your test results, treatment options, and if necessary, the need for more tests. Vaccines  Your health care provider may recommend certain vaccines, such as: Influenza vaccine. This is recommended every year. Tetanus, diphtheria, and acellular pertussis (Tdap, Td) vaccine. You may need a Td booster every 10 years. Zoster vaccine. You may need this after age 34. Pneumococcal 13-valent conjugate (PCV13) vaccine. One dose is recommended after age 29. Pneumococcal polysaccharide (PPSV23)  vaccine. One dose is recommended after age 3. Talk to your health care provider about which screenings and vaccines you need and how often you need them. This information is not intended to replace advice given to you by your health care provider. Make sure you discuss any questions you have with your health care provider. Document Released: 07/01/2015 Document Revised: 02/22/2016 Document Reviewed: 04/05/2015 Elsevier Interactive Patient Education  2017 [employer].  Fall Prevention in the Home Falls can cause injuries. They can happen to people of all ages. There are many things you can do to make your home safe and to help prevent falls. What can I do on the outside of my home? Regularly fix the edges of walkways and driveways and fix any cracks. Remove anything that might make you trip as you walk through a door, such as a raised step or threshold. Trim any bushes or trees on the path to your home. Use bright outdoor lighting. Clear any walking paths of anything that might make someone trip, such as rocks or tools. Regularly check to see if handrails are loose or broken. Make sure that both sides of any steps have handrails. Any raised decks and porches should have guardrails on the edges. Have any leaves, snow, or ice cleared regularly. Use sand or salt on walking paths during winter. Clean up any spills in your garage  right away. This includes oil or grease spills. What can I do in the bathroom? Use night lights. Install grab bars by the toilet and in the tub and shower. Do not use towel bars as grab bars. Use non-skid mats or decals in the tub or shower. If you need to sit down in the shower, use a plastic, non-slip stool. Keep the floor dry. Clean up any water that spills on the floor as soon as it happens. Remove soap buildup in the tub or shower regularly. Attach bath mats securely with double-sided non-slip rug tape. Do not have throw rugs and other things on the floor that  can make you trip. What can I do in the bedroom? Use night lights. Make sure that you have a light by your bed that is easy to reach. Do not use any sheets or blankets that are too big for your bed. They should not hang down onto the floor. Have a firm chair that has side arms. You can use this for support while you get dressed. Do not have throw rugs and other things on the floor that can make you trip. What can I do in the kitchen? Clean up any spills right away. Avoid walking on wet floors. Keep items that you use a lot in easy-to-reach places. If you need to reach something above you, use a strong step stool that has a grab bar. Keep electrical cords out of the way. Do not use floor polish or wax that makes floors slippery. If you must use wax, use non-skid floor wax. Do not have throw rugs and other things on the floor that can make you trip. What can I do with my stairs? Do not leave any items on the stairs. Make sure that there are handrails on both sides of the stairs and use them. Fix handrails that are broken or loose. Make sure that handrails are as long as the stairways. Check any carpeting to make sure that it is firmly attached to the stairs. Fix any carpet that is loose or worn. Avoid having throw rugs at the top or bottom of the stairs. If you do have throw rugs, attach them to the floor with carpet tape. Make sure that you have a light switch at the top of the stairs and the bottom of the stairs. If you do not have them, ask someone to add them for you. What else can I do to help prevent falls? Wear shoes that: Do not have high heels. Have rubber bottoms. Are comfortable and fit you well. Are closed at the toe. Do not wear sandals. If you use a stepladder: Make sure that it is fully opened. Do not climb a closed stepladder. Make sure that both sides of the stepladder are locked into place. Ask someone to hold it for you, if possible. Clearly mark and make sure that you  can see: Any grab bars or handrails. First and last steps. Where the edge of each step is. Use tools that help you move around (mobility aids) if they are needed. These include: Canes. Walkers. Scooters. Crutches. Turn on the lights when you go into a dark area. Replace any light bulbs as soon as they burn out. Set up your furniture so you have a clear path. Avoid moving your furniture around. If any of your floors are uneven, fix them. If there are any pets around you, be aware of where they are. Review your medicines with your doctor. Some medicines can make  you feel dizzy. This can increase your chance of falling. Ask your doctor what other things that you can do to help prevent falls. This information is not intended to replace advice given to you by your health care provider. Make sure you discuss any questions you have with your health care provider. Document Released: 03/31/2009 Document Revised: 11/10/2015 Document Reviewed: 07/09/2014 Elsevier Interactive Patient Education  2017 [employer].

## 2022-07-09 ENCOUNTER — Other Ambulatory Visit: Payer: Self-pay | Admitting: Family Medicine

## 2022-07-09 DIAGNOSIS — E039 Hypothyroidism, unspecified: Secondary | ICD-10-CM

## 2022-07-18 DIAGNOSIS — J449 Chronic obstructive pulmonary disease, unspecified: Secondary | ICD-10-CM | POA: Diagnosis not present

## 2022-07-19 DIAGNOSIS — M545 Low back pain, unspecified: Secondary | ICD-10-CM | POA: Diagnosis not present

## 2022-07-19 IMAGING — MG MM DIGITAL SCREENING BILAT W/ TOMO AND CAD
8 series · 8 of 24 positions shown · non-contrast
Comparison: Previous exam(s).

CLINICAL DATA: Screening.

EXAM:
DIGITAL SCREENING BILATERAL MAMMOGRAM WITH TOMOSYNTHESIS AND CAD
TECHNIQUE: Bilateral screening digital craniocaudal and mediolateral oblique
mammograms were obtained. Bilateral screening digital breast
tomosynthesis was performed. The images were evaluated with
computer-aided detection.

[L MLO synth-2D]
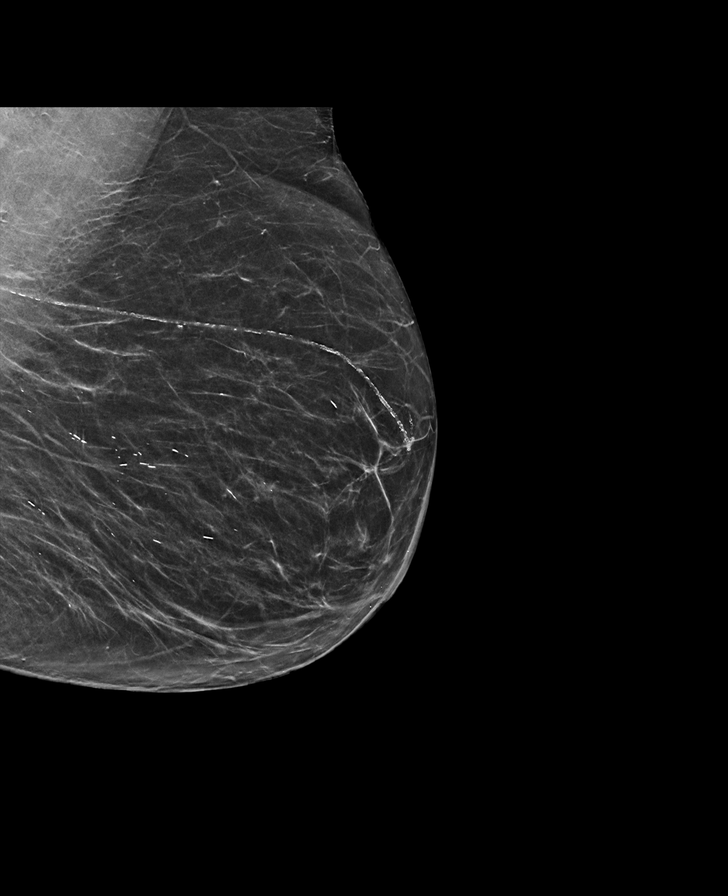

[L CC synth-2D]
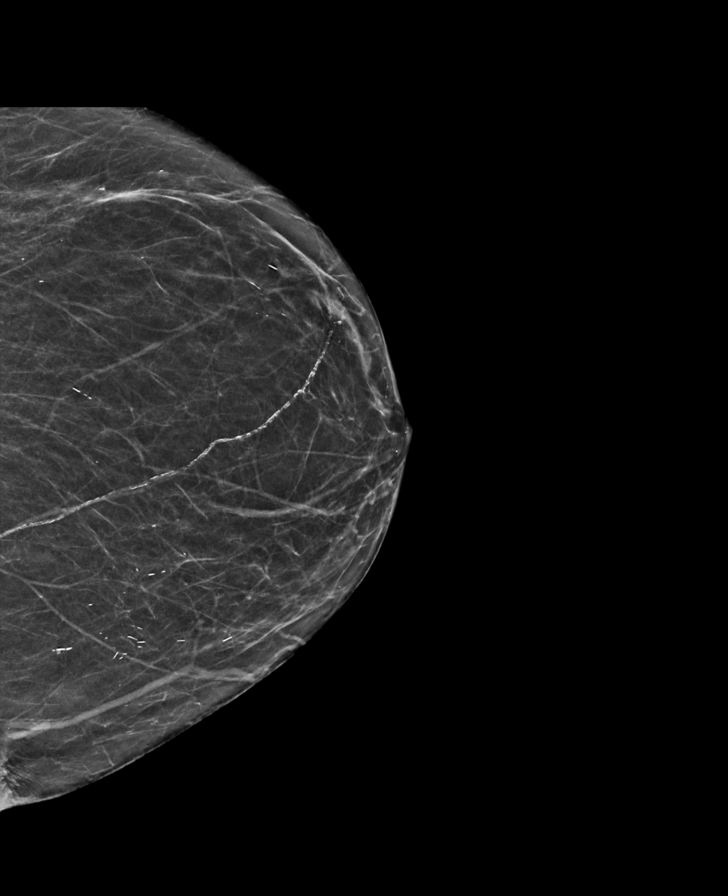

[R MLO synth-2D]
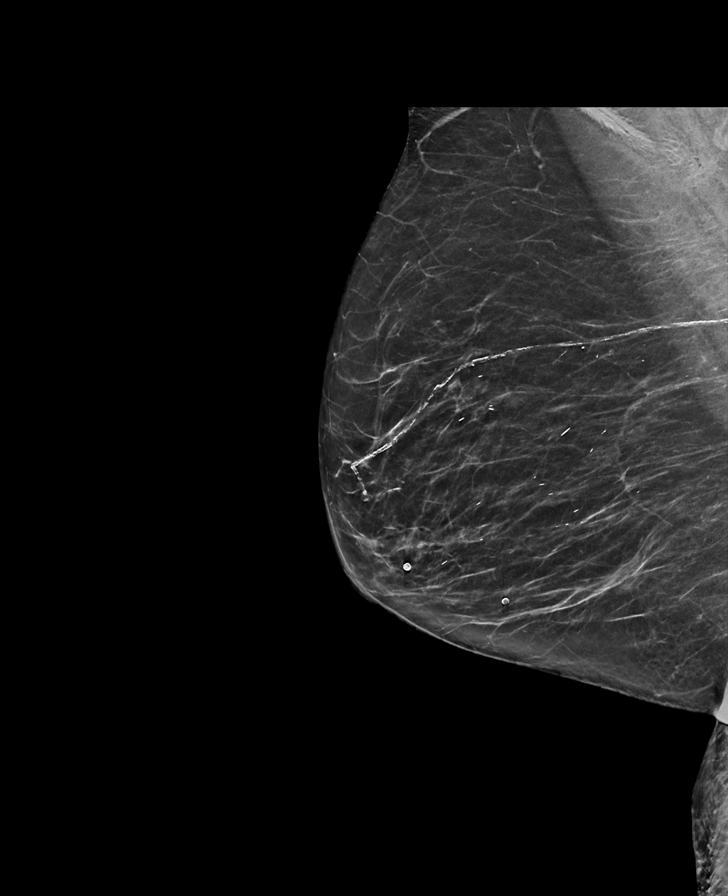

[R CC synth-2D]
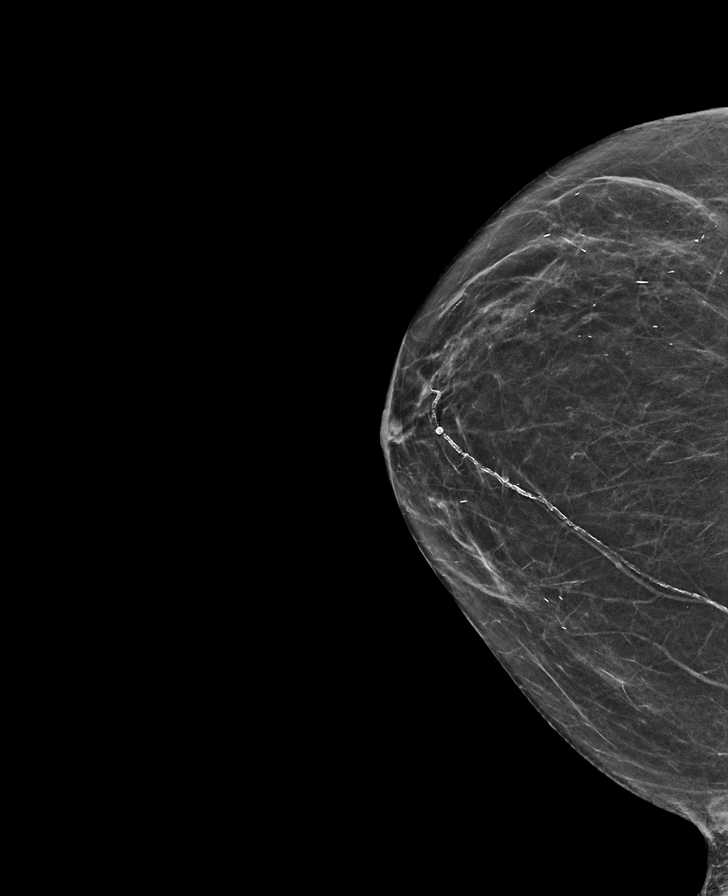

[L CC tomo · tomo slice 27/53.0]
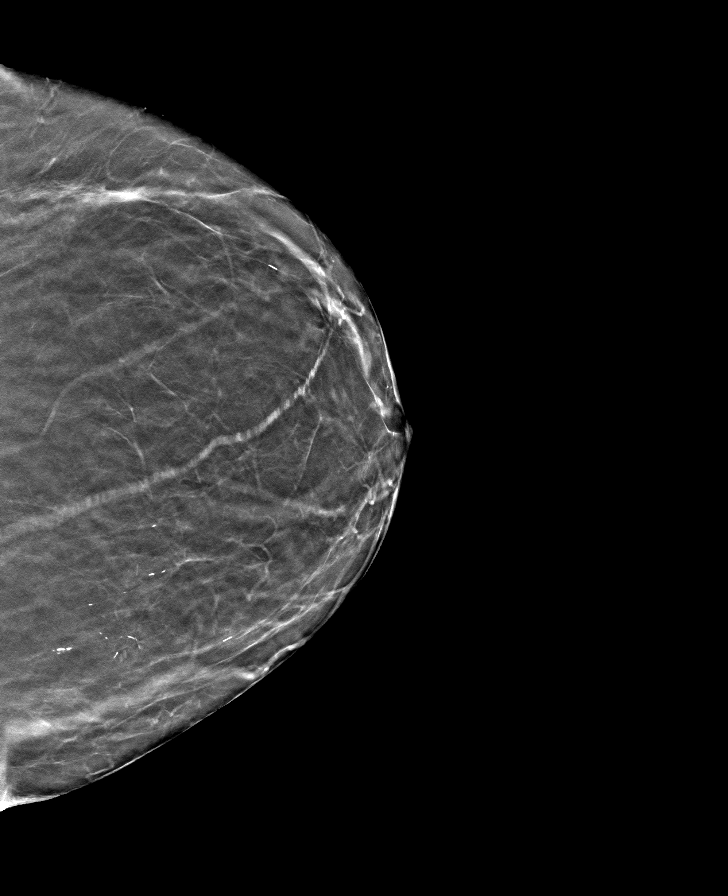

[R CC tomo · tomo slice 29/57.0]
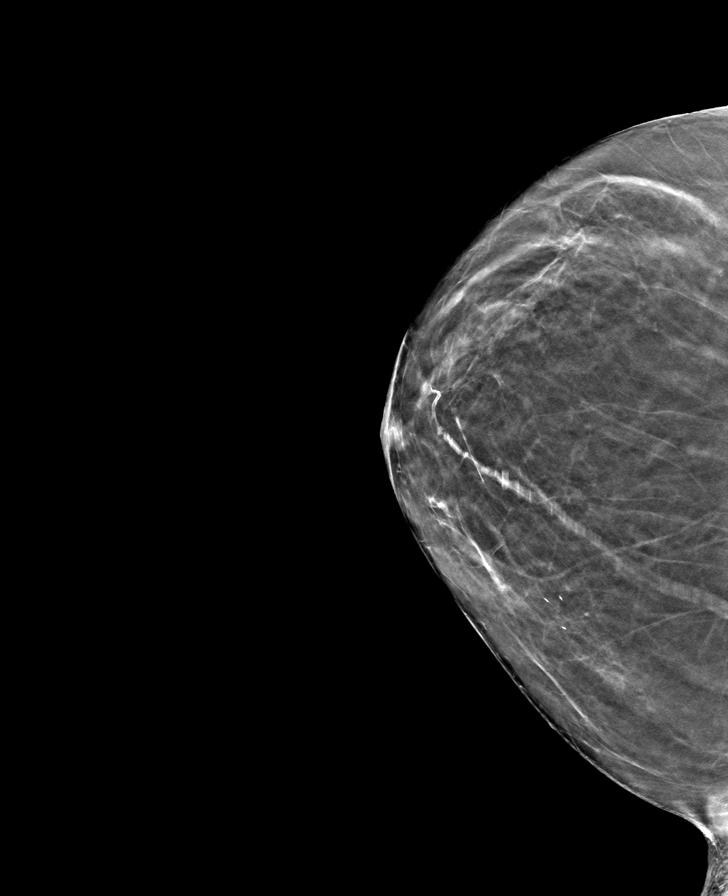

[L MLO tomo · tomo slice 30/59.0]
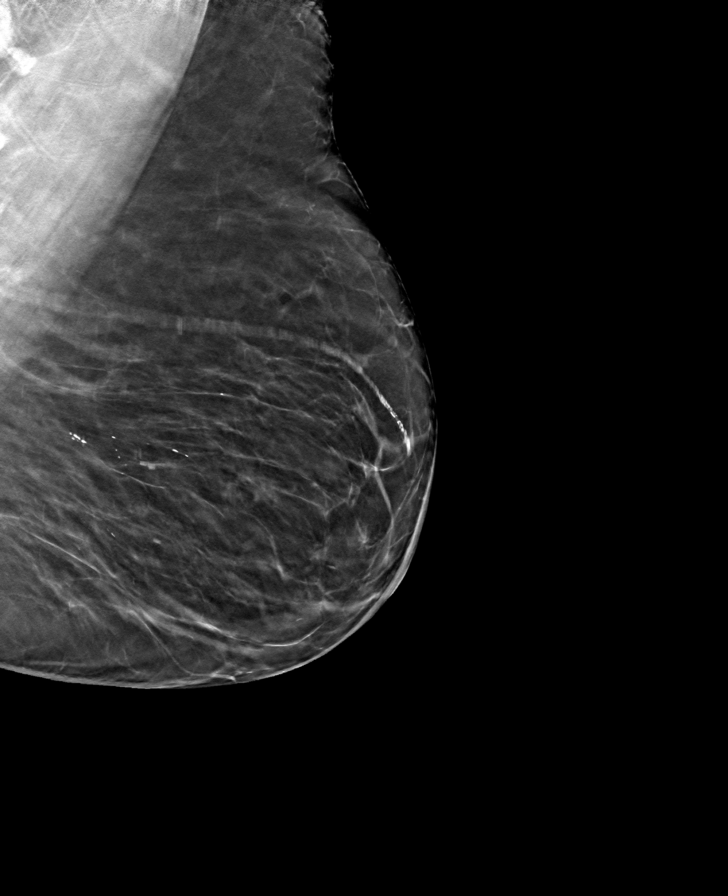

[R MLO tomo · tomo slice 32/63.0]
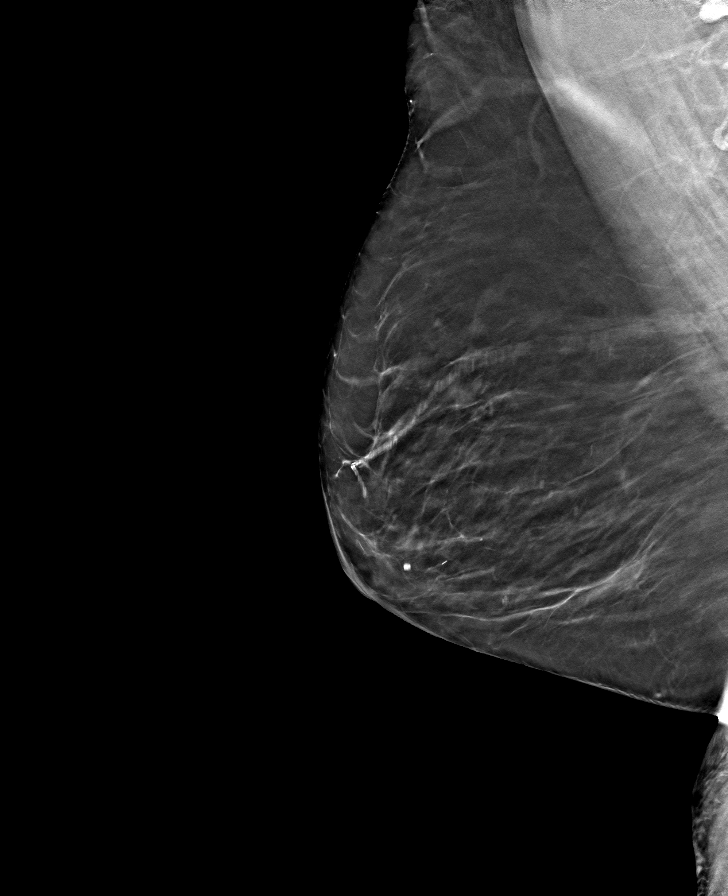

[8 of 24 positions shown; findings below may reference images not displayed]

ACR Breast Density Category b: There are scattered areas of
fibroglandular density.
FINDINGS: There are no findings suspicious for malignancy.
IMPRESSION: No mammographic evidence of malignancy. A result letter of this
screening mammogram will be mailed directly to the patient.

RECOMMENDATION:
Screening mammogram in one year. (Code:51-O-LD2)

BI-RADS CATEGORY  1: Negative.

## 2022-08-02 DIAGNOSIS — M545 Low back pain, unspecified: Secondary | ICD-10-CM | POA: Diagnosis not present

## 2022-08-12 NOTE — Progress Notes (Unsigned)
Cardiology Office Note:    Date:  08/13/2022   ID:  Tiffany, Jennings 1934-10-11, MRN [id number]  PCP:  Libby Maw, MD  Cardiologist:  None  Electrophysiologist:  None   Referring MD: Libby Maw,*   Chief Complaint  Patient presents with   Atrial Fibrillation    History of Present Illness:    Tiffany Jennings is a 88 y.o. female with a hx of atrial fibrillation on Eliquis, COPD on oxygen, hypertension, hypothyroidism, hyperlipidemia, DVT/PE who presents for follow-up.  She was initially seen on 03/30/2019 for evaluation of atrial fibrillation.  She had moved to the area from [facility].  Was in hospital for pneumonia several years ago and told she had AF.   She denies any symptoms from atrial fibrillation, and does not think she has had any AF since her hospitalization.  She has been on amiodarone 100 mg daily.  The notes from her PCP in [place] indicate that amiodarone was to be discontinued due to her chronic respiratory disease.  However she continued to take amiodarone.  She underwent a TTE in July 2020, which showed normal EF, mild concentric hypertrophy, moderate diastolic dysfunction, no significant valvular disease reported.  She has been on Eliquis and diltiazem for her AF.  Amiodarone was discontinued at initial clinic visit in 03/30/2019.  Zio patch x14 days on 07/29/2019 showed very frequent PACs (18.1% of beats) with numerous episodes of SVT, longest lasting 7 beats.  No evidence of atrial fibrillation.  Echocardiogram on 08/12/2019 showed normal biventricular function, no significant valvular disease.  Had appointment on 08/16/2020 and noted to have significant orthostatic hypotension.  Her Lasix and diltiazem were discontinued.  Subsequently presented on 3/8 with atrial flutter, rate 150 bpm.  Diltiazem was restarted and repeat EKG on 3/9 showed rates had improved to 80s.  At follow-up visit on 4/1 she was back in sinus rhythm.  Zio patch x7 days on  10/07/2020 showed 2564 episodes of SVT, longest lasting 15 beats and frequent PACs (10.3% of beats), supraventricular couplets (16.1%), and triplets (6.6%).  Echocardiogram 01/26/2021 showed normal biventricular function, grade 1 diastolic dysfunction, no significant valvular disease.  Quit smoking.  Since last clinic visit, she reports she has been doing okay.  Continues to go to gym 5 days/week, does about 7000 steps per day.  Denies chest pain or shortness of breath.  Reports some lightheadedness with standing but denies any syncope.  Has had some tachycardia onoximeter but none recently.  Denies any bleeding on Eliquis.   Wt Readings from Last 3 Encounters:  08/13/22 183 lb 12.8 oz (83.4 kg)  07/06/22 172 lb (78 kg)  04/16/22 178 lb 9.6 oz (81 kg)     Past Medical History:  Diagnosis Date   A-fib (HCC)    COPD (chronic obstructive pulmonary disease) (HCC)    Hyperlipidemia    Hypertension     Past Surgical History:  Procedure Laterality Date   CAROTID ANGIOGRAM      Current Medications: Current Meds  Medication Sig   albuterol (VENTOLIN HFA) 108 (90 Base) MCG/ACT inhaler INHALE 2 PUFFS INTO THE LUNGS EVERY 6 HOURS AS NEEDED FOR WHEEZING OR SHORTNESS OF BREATH   apixaban (ELIQUIS) 5 MG TABS tablet Take 1 tablet (5 mg total) by mouth 2 (two) times daily.   atorvastatin (LIPITOR) 20 MG tablet TAKE ONE TABLET BY MOUTH DAILY   carboxymethylcellulose (REFRESH PLUS) 0.5 % SOLN 1 drop 3 (three) times daily as needed.   diltiazem (  CARDIZEM CD) 120 MG 24 hr capsule TAKE 1 CAPSULE BY MOUTH DAILY ** KEEP UPCOMING APPOINTMENT FOR FUTURE REFILLS**   donepezil (ARICEPT) 10 MG tablet TAKE ONE TABLET BY MOUTH EVERY NIGHT AT BEDTIME   imipramine (TOFRANIL) 50 MG tablet TAKE ONE TO TWO TABLETS BY MOUTH EVERY NIGHT AT BEDTIME   levothyroxine (SYNTHROID) 50 MCG tablet TAKE ONE TABLET BY MOUTH DAILY BEFORE BREAKFAST   TRELEGY ELLIPTA 100-62.5-25 MCG/ACT AEPB INHALE ONE PUFF BY MOUTH DAILY      Allergies:   Codeine   Social History   Socioeconomic History   Marital status: Single    Spouse name: Not on file   Number of children: Not on file   Years of education: Not on file   Highest education level: Not on file  Occupational History   Not on file  Tobacco Use   Smoking status: Former    Packs/day: 0.25    Years: 50.00    Total pack years: 12.50    Types: Cigarettes    Quit date: 04/18/2021    Years since quitting: 1.3   Smokeless tobacco: Never  Vaping Use   Vaping Use: Never used  Substance and Sexual Activity   Alcohol use: Not Currently   Drug use: Never   Sexual activity: Not on file  Other Topics Concern   Not on file  Social History Narrative   Not on file   Social Determinants of Health   Financial Resource Strain: Low Risk  (07/06/2022)   Overall Financial Resource Strain (CARDIA)    Difficulty of Paying Living Expenses: Not hard at all  Food Insecurity: No Food Insecurity (07/06/2022)   Hunger Vital Sign    Worried About Running Out of Food in the Last Year: Never true    Ran Out of Food in the Last Year: Never true  Transportation Needs: No Transportation Needs (07/06/2022)   PRAPARE - Hydrologist (Medical): No    Lack of Transportation (Non-Medical): No  Physical Activity: Sufficiently Active (07/06/2022)   Exercise Vital Sign    Days of Exercise per Week: 5 days    Minutes of Exercise per Session: 90 min  Stress: No Stress Concern Present (07/06/2022)   [place]    Feeling of Stress : Not at all  Social Connections: Moderately Isolated (10/04/2020)   Social Connection and Isolation Panel [NHANES]    Frequency of Communication with Friends and Family: More than three times a week    Frequency of Social Gatherings with Friends and Family: Once a week    Attends Religious Services: Never    Marine scientist or Organizations: Yes    Attends  Archivist Meetings: 1 to 4 times per year    Marital Status: Widowed     Family History: No relevant family history of heart disease  ROS:   Please see the history of present illness.    All other systems reviewed and are negative.  EKGs/Labs/Other Studies Reviewed:    The following studies were reviewed today:  ECHO 08/12/2019:   IMPRESSIONS:  1. Left ventricular ejection fraction, by estimation, is 55 to 60%. The  left ventricle has normal function. The left ventricle has no regional  wall motion abnormalities. There is mild concentric left ventricular  hypertrophy. Left ventricular diastolic  function could not be evaluated.   2. Right ventricular systolic function is normal. The right ventricular  size is normal.  Tricuspid regurgitation signal is inadequate for assessing  PA pressure.   3. The mitral valve is normal in structure and function. Trivial mitral  valve regurgitation. No evidence of mitral stenosis.   4. The aortic valve is tricuspid. Aortic valve regurgitation is not  visualized. No aortic stenosis is present.   5. The inferior vena cava is normal in size with greater than 50%  respiratory variability, suggesting right atrial pressure of 3 mmHg.   10/07/20: LONG TERM MONITOR  Study Highlights   Very frequent PACs (18.1% of beats), with numerous episodes of SVT (longest lasting 7 beats) No atrial fibrillation   14 days of data recorded on Zio monitor. Patient had a min HR of 50 bpm, max HR of 176 bpm, and avg HR of 68 bpm. Predominant underlying rhythm was Sinus Rhythm. No VT, atrial fibrillation, high degree block, or pauses noted. 926 episodes of SVT, longest lasting 7 beats.  Isolated ventricular ectopy was rare (<1%). Very frequent PACs (18.1% of beats).  There were 7 triggered events, which corresponded to sinus rhythm plus PACs.     04/13/2019: vas [place] le artery seg  Summary:  Right: Resting right ankle-brachial index is within normal  range. No  evidence of significant right lower extremity arterial disease. The right  toe-brachial index is normal.   Left: Resting left ankle-brachial index is within normal range. No  evidence of significant left lower extremity arterial disease. The left  toe-brachial index is abnormal.   EKG:   2/226/24: Normal sinus rhythm, frequent PACs, rate 83 04/16/22: Sinus rhythm, rate 83, frequent PACs, left axis deviation 01/04/21:sinus rhythm with PACs rate 78 bpm,  09/16/20-Sinus with PACs, rate 74 bpm   08/23/20-Atrial flutter, rate 152 bpm  Recent Labs: 03/08/2022: ALT 18; BUN 29; Creatinine, Ser 1.17; Hemoglobin 11.0; Platelets 345.0; Potassium 4.7; Sodium 138; TSH 2.76  Recent Lipid Panel    Component Value Date/Time   CHOL 138 03/08/2022 1157   CHOL 177 06/27/2020 1145   TRIG 40.0 03/08/2022 1157   HDL 76.00 03/08/2022 1157   HDL 82 06/27/2020 1145   CHOLHDL 2 03/08/2022 1157   VLDL 8.0 03/08/2022 1157   LDLCALC 54 03/08/2022 1157   LDLCALC 82 06/27/2020 1145   LDLDIRECT 86 03/06/2019 1142    Physical Exam:    VS:  BP 138/82   Pulse 83   Ht '5\' 6"'$  (1.676 m)   Wt 183 lb 12.8 oz (83.4 kg)   SpO2 90%   BMI 29.67 kg/m     Wt Readings from Last 3 Encounters:  08/13/22 183 lb 12.8 oz (83.4 kg)  07/06/22 172 lb (78 kg)  04/16/22 178 lb 9.6 oz (81 kg)     GEN:  in no acute distress, on oxygen HEENT: Normal NECK: No JVD CARDIAC: Irregular rhythm, normal rate, no murmurs, rubs, gallops RESPIRATORY: no wheezing ABDOMEN: Soft, non-tender, non-distended MUSCULOSKELETAL:  No edema; No deformity  SKIN: Warm and dry NEUROLOGIC:  Alert and oriented x 3 PSYCHIATRIC:  Normal affect   ASSESSMENT:    1. Paroxysmal atrial fibrillation (HCC)   2. Orthostatic hypotension   3. PAC (premature atrial contraction)   4. Hyperlipidemia, unspecified hyperlipidemia type      PLAN:    Atrial fibrillation/flutter: On diltiazem 120 mg and Eliquis 5 mg twice daily.  CHADS-VASc 4  (HTN, agex2, female).  TTE from 08/12/19 shows normal biventricular function, no significant valvular disease.  Amiodarone discontinued 03/2019 due to risk of pulmonary toxicity given her COPD.  Zio patch x14 days on 07/29/2019 showed very frequent PACs (18.1% of beats) with numerous episodes of SVT, longest lasting 7 beats.  No evidence of atrial fibrillation.  Had recurrence of atrial flutter on 3/8 while diltiazem was held due to orthostatic hypotension, rates up to 150s.  Rate improved to 80s with restarting diltiazem and she spontaneously converted to sinus rhythm.  Zio patch x7 days on 10/07/2020 showed no Afib but 2564 episodes of SVT, longest lasting 15 beats and frequent PACs (10.3% of beats), supraventricular couplets (16.1%), and triplets (6.6%).  Echocardiogram 01/26/2021 showed normal biventricular function, grade 1 diastolic dysfunction, no significant valvular disease. -Continue Eliquis 5 mg twice daily -Continue diltiazem 120 mg daily  Orthostatic hypotension: BP dropped from 133/68 lying to 87/46 at clinic appointment on 08/19/20.  She had been taking Lasix 20 mg 3 times weekly, have discontinued.   Diltiazem has been held but was restarted given her atrial flutter with RVR.  Suspect due to Lasix use, has been discontinued.  Appears improved.  Advised to stay hydrated and stand slowly  Frequent PACs/SVT: Zio patch x14 days on 07/29/2019 showed very frequent PACs (18.1% of beats) with numerous episodes of SVT, longest lasting 7 beats.  Continue diltiazem.  TSH was low, hypothyroidism may have been contributing to increased ectopy, her levothyroxine dose was decreased.  TSH normal on 06/27/2020.  Zio patch x7 days on 10/07/2020 showed no Afib but 2564 episodes of SVT, longest lasting 15 beats and frequent PACs (10.3% of beats), supraventricular couplets (16.1%), and triplets (6.6%). -Continue diltiazem as above  Chronic diastolic heart failure: Was on Lasix 20 mg 3 times weekly.  Discontinued Lasix  due to orthostatic hypotension as above.  Hypertension: Presented with orthostatic hypotension, held diltiazem but restarted for atrial flutter as above.  Appears controlled.  Hyperlipidemia: On atorvastatin 10 mg.  LDL 105 on 12/28/2019, atorvastatin increased to 20 mg daily.  LDL 54 on 03/08/22  Tobacco use: encouraged continued cessation  Leg pain: ABIs normal, no evidence of PAD  COPD: on home oxygen.  Follows with pulmonology.   RTC in 6 months   Medication Adjustments/Labs and Tests Ordered: Current medicines are reviewed at length with the patient today.  Concerns regarding medicines are outlined above.  Orders Placed This Encounter  Procedures   EKG 12-Lead    No orders of the defined types were placed in this encounter.    Patient Instructions  Medication Instructions:  Your physician recommends that you continue on your current medications as directed. Please refer to the Current Medication list given to you today.  *If you need a refill on your cardiac medications before your next appointment, please call your pharmacy*  Follow-Up: At [facility], you and your health needs are our priority.  As part of our continuing mission to provide you with exceptional heart care, we have created designated Provider Care Teams.  These Care Teams include your primary Cardiologist (physician) and Advanced Practice Providers (APPs -  Physician Assistants and Nurse Practitioners) who all work together to provide you with the care you need, when you need it.  We recommend signing up for the patient portal called "MyChart".  Sign up information is provided on this After Visit Summary.  MyChart is used to connect with patients for Virtual Visits (Telemedicine).  Patients are able to view lab/test results, encounter notes, upcoming appointments, etc.  Non-urgent messages can be sent to your provider as well.   To learn more about what you can do  with MyChart, go to  [email or web address].    Your next appointment:   6 month(s)  Provider:   Dr. Gardiner Rhyme       Signed, Donato Heinz, MD  08/13/2022 10:36 AM    [place]

## 2022-08-13 ENCOUNTER — Ambulatory Visit: Payer: Medicare Other | Attending: Nurse Practitioner | Admitting: Cardiology

## 2022-08-13 ENCOUNTER — Encounter: Payer: Self-pay | Admitting: Cardiology

## 2022-08-13 VITALS — BP 138/82 | HR 83 | Ht 66.0 in | Wt 183.8 lb

## 2022-08-13 DIAGNOSIS — E785 Hyperlipidemia, unspecified: Secondary | ICD-10-CM

## 2022-08-13 DIAGNOSIS — I951 Orthostatic hypotension: Secondary | ICD-10-CM

## 2022-08-13 DIAGNOSIS — I480 Paroxysmal atrial fibrillation: Secondary | ICD-10-CM

## 2022-08-13 DIAGNOSIS — I491 Atrial premature depolarization: Secondary | ICD-10-CM | POA: Diagnosis not present

## 2022-08-13 NOTE — Patient Instructions (Signed)
Medication Instructions:  Your physician recommends that you continue on your current medications as directed. Please refer to the Current Medication list given to you today.  *If you need a refill on your cardiac medications before your next appointment, please call your pharmacy*  Follow-Up: At [facility], you and your health needs are our priority.  As part of our continuing mission to provide you with exceptional heart care, we have created designated Provider Care Teams.  These Care Teams include your primary Cardiologist (physician) and Advanced Practice Providers (APPs -  Physician Assistants and Nurse Practitioners) who all work together to provide you with the care you need, when you need it.  We recommend signing up for the patient portal called "MyChart".  Sign up information is provided on this After Visit Summary.  MyChart is used to connect with patients for Virtual Visits (Telemedicine).  Patients are able to view lab/test results, encounter notes, upcoming appointments, etc.  Non-urgent messages can be sent to your provider as well.   To learn more about what you can do with MyChart, go to [email or web address].    Your next appointment:   6 month(s)  Provider:   Dr. Gardiner Rhyme

## 2022-08-16 ENCOUNTER — Telehealth: Payer: Self-pay | Admitting: Cardiology

## 2022-08-16 DIAGNOSIS — J449 Chronic obstructive pulmonary disease, unspecified: Secondary | ICD-10-CM | POA: Diagnosis not present

## 2022-08-16 DIAGNOSIS — M545 Low back pain, unspecified: Secondary | ICD-10-CM | POA: Diagnosis not present

## 2022-08-16 NOTE — Telephone Encounter (Signed)
Patient calling with question about her oxygen, asking if she can turn her O2 down. Advised patient to call her pulmonologist to discuss, patient verbalizes understanding.

## 2022-08-16 NOTE — Telephone Encounter (Signed)
Patient is asking that the nurse give her a call. Please advise

## 2022-08-30 DIAGNOSIS — M545 Low back pain, unspecified: Secondary | ICD-10-CM | POA: Diagnosis not present

## 2022-09-13 DIAGNOSIS — M545 Low back pain, unspecified: Secondary | ICD-10-CM | POA: Diagnosis not present

## 2022-09-16 DIAGNOSIS — J449 Chronic obstructive pulmonary disease, unspecified: Secondary | ICD-10-CM | POA: Diagnosis not present

## 2022-09-27 DIAGNOSIS — M545 Low back pain, unspecified: Secondary | ICD-10-CM | POA: Diagnosis not present

## 2022-10-04 ENCOUNTER — Other Ambulatory Visit: Payer: Self-pay | Admitting: Pulmonary Disease

## 2022-10-08 ENCOUNTER — Telehealth: Payer: Self-pay | Admitting: Family Medicine

## 2022-10-08 NOTE — Telephone Encounter (Signed)
Patient called for document Handicap Placard, to be filled out by provider. Patient requested to send it via Mail to her home address: [address] [place] [address]    within 5-days.  Document is located in providers tray at front office.Please advise at Mobile [phone] (mobile)

## 2022-10-09 NOTE — Telephone Encounter (Signed)
Form received and placed on Providers desk to be signed.

## 2022-10-10 NOTE — Telephone Encounter (Signed)
CLINICAL USE BELOW THIS LINE (use X to signify taken)  ____Form received and placed in providers office for signature. __X__Form completed and mailed to home per pt request. ____Form completed & LVM to notify pt ready for pick up ____Charge sheet & copy of form in front office folder for office supervisor.   

## 2022-10-16 DIAGNOSIS — J449 Chronic obstructive pulmonary disease, unspecified: Secondary | ICD-10-CM | POA: Diagnosis not present

## 2022-10-25 ENCOUNTER — Other Ambulatory Visit: Payer: Self-pay | Admitting: Family Medicine

## 2022-10-25 DIAGNOSIS — F339 Major depressive disorder, recurrent, unspecified: Secondary | ICD-10-CM

## 2022-11-08 DIAGNOSIS — M545 Low back pain, unspecified: Secondary | ICD-10-CM | POA: Diagnosis not present

## 2022-11-16 DIAGNOSIS — J449 Chronic obstructive pulmonary disease, unspecified: Secondary | ICD-10-CM | POA: Diagnosis not present

## 2022-11-26 ENCOUNTER — Telehealth: Payer: Self-pay | Admitting: Family Medicine

## 2022-11-26 NOTE — Telephone Encounter (Signed)
Please advise message below  °

## 2022-11-26 NOTE — Telephone Encounter (Signed)
Spoke with patient who states that this particular tea is from  a [place] leaf she was asked to check with doctor before taking this.Will it be okay to take no more than 8 oz if this tea?

## 2022-11-26 NOTE — Telephone Encounter (Signed)
Pt would like a call to ask whether she can drink a tea. She doesn't want to mix wrong with her meds.

## 2022-11-27 NOTE — Telephone Encounter (Signed)
Patient aware of message below an will not take tea

## 2022-12-06 DIAGNOSIS — M545 Low back pain, unspecified: Secondary | ICD-10-CM | POA: Diagnosis not present

## 2022-12-16 DIAGNOSIS — J449 Chronic obstructive pulmonary disease, unspecified: Secondary | ICD-10-CM | POA: Diagnosis not present

## 2022-12-20 DIAGNOSIS — M545 Low back pain, unspecified: Secondary | ICD-10-CM | POA: Diagnosis not present

## 2023-01-03 DIAGNOSIS — M545 Low back pain, unspecified: Secondary | ICD-10-CM | POA: Diagnosis not present

## 2023-01-04 ENCOUNTER — Other Ambulatory Visit: Payer: Self-pay | Admitting: Cardiology

## 2023-01-04 ENCOUNTER — Telehealth: Payer: Self-pay | Admitting: Cardiology

## 2023-01-04 NOTE — Telephone Encounter (Signed)
Pt's medication was sent to pt's pharmacy as requested. Confirmation received.  °

## 2023-01-04 NOTE — Telephone Encounter (Signed)
*  STAT* If patient is at the pharmacy, call can be transferred to refill team.   1. Which medications need to be refilled? (please list name of each medication and dose if known) atorvastatin (LIPITOR) 20 MG tablet    2. Would you like to learn more about the convenience, safety, & potential cost savings by using the [facility]? No     3. Are you open to using the [facility] (Type [facility]. No ).   4. Which pharmacy/location (including street and city if local pharmacy) is medication to be sent to? [facility] [id number] - [place], [place] - [address]  5. Do they need a 30 day or 90 day supply? 90

## 2023-01-16 DIAGNOSIS — J449 Chronic obstructive pulmonary disease, unspecified: Secondary | ICD-10-CM | POA: Diagnosis not present

## 2023-01-17 DIAGNOSIS — M545 Low back pain, unspecified: Secondary | ICD-10-CM | POA: Diagnosis not present

## 2023-01-28 ENCOUNTER — Ambulatory Visit (INDEPENDENT_AMBULATORY_CARE_PROVIDER_SITE_OTHER): Payer: Medicare Other | Admitting: Family Medicine

## 2023-01-28 ENCOUNTER — Encounter: Payer: Self-pay | Admitting: Family Medicine

## 2023-01-28 VITALS — BP 130/74 | HR 75 | Temp 97.2°F | Ht 66.0 in | Wt 171.0 lb

## 2023-01-28 DIAGNOSIS — M545 Low back pain, unspecified: Secondary | ICD-10-CM | POA: Diagnosis not present

## 2023-01-28 DIAGNOSIS — E7800 Pure hypercholesterolemia, unspecified: Secondary | ICD-10-CM

## 2023-01-28 DIAGNOSIS — E039 Hypothyroidism, unspecified: Secondary | ICD-10-CM

## 2023-01-28 DIAGNOSIS — R809 Proteinuria, unspecified: Secondary | ICD-10-CM

## 2023-01-28 NOTE — Progress Notes (Addendum)
Established Patient Office Visit   Subjective:  Patient ID: Tiffany Jennings, female    DOB: [birth year]  Age: 88 y.o. MRN: [id number]  Chief Complaint  Patient presents with   Back Pain    Right upper  and lower lumbar pain and soreness x 1 month. Pt works out 5 days a week.    Eye Burn    Pt states she has constant burning eyes. Pt uses refresh eye drops.     Back Pain Pertinent negatives include no abdominal pain, tingling or weakness.   Encounter Diagnoses  Name Primary?   Hypothyroidism, unspecified type Yes   Elevated cholesterol    Bilateral low back pain without sciatica, unspecified chronicity    Proteinuria, unspecified type    For follow-up of above.  Continues levothyroxine and atorvastatin for hypothyroidism and elevated cholesterol.  2-week history of nonradiating bilateral lower back pain.  Denies saddle paresthesias, bowel or bladder incontinence.  Morning stretches help.  She continues to workout in the gym several days a week.  Continues with supplemental oxygen   Review of Systems  Constitutional: Negative.   HENT: Negative.    Eyes:  Negative for blurred vision, discharge and redness.  Respiratory: Negative.    Cardiovascular: Negative.   Gastrointestinal:  Negative for abdominal pain.  Genitourinary: Negative.   Musculoskeletal:  Positive for back pain. Negative for myalgias.  Skin:  Negative for rash.  Neurological:  Negative for tingling, loss of consciousness and weakness.  Endo/Heme/Allergies:  Negative for polydipsia.     Current Outpatient Medications:    albuterol (VENTOLIN HFA) 108 (90 Base) MCG/ACT inhaler, INHALE 2 PUFFS INTO THE LUNGS EVERY 6 HOURS AS NEEDED FOR WHEEZING OR SHORTNESS OF BREATH, Disp: 18 g, Rfl: 1   apixaban (ELIQUIS) 5 MG TABS tablet, Take 1 tablet (5 mg total) by mouth 2 (two) times daily., Disp: 180 tablet, Rfl: 3   atorvastatin (LIPITOR) 20 MG tablet, TAKE ONE TABLET BY MOUTH DAILY, Disp: 90 tablet, Rfl: 2    carboxymethylcellulose (REFRESH PLUS) 0.5 % SOLN, 1 drop 3 (three) times daily as needed., Disp: , Rfl:    diltiazem (CARDIZEM CD) 120 MG 24 hr capsule, TAKE 1 CAPSULE BY MOUTH DAILY ** KEEP UPCOMING APPOINTMENT FOR FUTURE REFILLS**, Disp: 90 capsule, Rfl: 3   donepezil (ARICEPT) 10 MG tablet, TAKE ONE TABLET BY MOUTH EVERY NIGHT AT BEDTIME, Disp: 90 tablet, Rfl: 1   imipramine (TOFRANIL) 50 MG tablet, TAKE ONE TO TWO TABLETS BY MOUTH EVERY NIGHT AT BEDTIME, Disp: 180 tablet, Rfl: 3   levothyroxine (SYNTHROID) 50 MCG tablet, TAKE ONE TABLET BY MOUTH DAILY BEFORE BREAKFAST, Disp: 90 tablet, Rfl: 4   TRELEGY ELLIPTA 100-62.5-25 MCG/ACT AEPB, INHALE 1 PUFF BY MOUTH DAILY, Disp: 60 each, Rfl: 5   Objective:     BP 130/74   Pulse 75   Temp (!) 97.2 F (36.2 C)   Ht 5\' 6"  (1.676 m)   Wt 171 lb (77.6 kg)   SpO2 97%   BMI 27.60 kg/m    Physical Exam Constitutional:      General: She is not in acute distress.    Appearance: Normal appearance. She is not ill-appearing, toxic-appearing or diaphoretic.  HENT:     Head: Normocephalic and atraumatic.     Right Ear: External ear normal.     Left Ear: External ear normal.     Mouth/Throat:     Mouth: Mucous membranes are moist.     Pharynx: Oropharynx is clear. No  oropharyngeal exudate or posterior oropharyngeal erythema.  Eyes:     General: No scleral icterus.       Right eye: No discharge.        Left eye: No discharge.     Extraocular Movements: Extraocular movements intact.     Conjunctiva/sclera: Conjunctivae normal.     Pupils: Pupils are equal, round, and reactive to light.  Cardiovascular:     Rate and Rhythm: Normal rate and regular rhythm.  Pulmonary:     Effort: Pulmonary effort is normal. No respiratory distress.     Breath sounds: Decreased air movement present. Decreased breath sounds present.  Abdominal:     General: Bowel sounds are normal.  Musculoskeletal:     Cervical back: No rigidity or tenderness.     Thoracic  back: No tenderness or bony tenderness. Normal range of motion.     Lumbar back: No tenderness or bony tenderness. Normal range of motion. Negative right straight leg raise test and negative left straight leg raise test.  Skin:    General: Skin is warm and dry.  Neurological:     Mental Status: She is alert and oriented to person, place, and time.     Motor: No weakness.  Psychiatric:        Mood and Affect: Mood normal.        Behavior: Behavior normal.      No results found for any visits on 01/28/23.    The ASCVD Risk score (Arnett DK, et al., 2019) failed to calculate for the following reasons:   The 2019 ASCVD risk score is only valid for ages 1 to 27    Assessment & Plan:   Hypothyroidism, unspecified type -     TSH  Elevated cholesterol -     CBC -     LDL cholesterol, direct -     Basic metabolic panel  Bilateral low back pain without sciatica, unspecified chronicity  Proteinuria, unspecified type -     Microalbumin / creatinine urine ratio; Future    Return in about 6 months (around 07/31/2023), or if symptoms worsen or fail to improve.  Follow-up with ophthalmology.  Continue stretches for low back pain.  Use Tylenol.  Follow-up in 2 to 3 weeks if not continuing to improve, could consider plain films.   Mliss Sax, MD

## 2023-01-29 ENCOUNTER — Telehealth: Payer: Self-pay

## 2023-01-29 NOTE — Telephone Encounter (Signed)
No further information is needed 

## 2023-01-30 ENCOUNTER — Telehealth: Payer: Self-pay | Admitting: Family Medicine

## 2023-01-30 NOTE — Telephone Encounter (Signed)
Pt would like a call to discuss her potassium. [phone]

## 2023-01-31 DIAGNOSIS — M545 Low back pain, unspecified: Secondary | ICD-10-CM | POA: Diagnosis not present

## 2023-02-06 ENCOUNTER — Encounter: Payer: Self-pay | Admitting: Pulmonary Disease

## 2023-02-06 ENCOUNTER — Ambulatory Visit: Payer: Medicare Other | Admitting: Pulmonary Disease

## 2023-02-06 ENCOUNTER — Telehealth: Payer: Self-pay | Admitting: Family Medicine

## 2023-02-06 VITALS — BP 130/72 | HR 68 | Temp 96.9°F | Ht 67.0 in | Wt 171.0 lb

## 2023-02-06 DIAGNOSIS — J449 Chronic obstructive pulmonary disease, unspecified: Secondary | ICD-10-CM | POA: Diagnosis not present

## 2023-02-06 DIAGNOSIS — G4733 Obstructive sleep apnea (adult) (pediatric): Secondary | ICD-10-CM | POA: Diagnosis not present

## 2023-02-06 DIAGNOSIS — J9611 Chronic respiratory failure with hypoxia: Secondary | ICD-10-CM | POA: Diagnosis not present

## 2023-02-06 NOTE — Patient Instructions (Signed)
I will see you 6 months from here  Call us with significant concerns  Continue using inhalers  Continue checking your oxygen on a regular basis

## 2023-02-06 NOTE — Telephone Encounter (Signed)
Pts ins called to notify her oxygen supplier has changed and to give them a call. They could not reach her.

## 2023-02-06 NOTE — Progress Notes (Signed)
Tiffany Jennings    [id number]    1935-04-26  Primary Care Physician:Kremer, Talmadge Coventry, MD  Referring Physician: Mliss Sax, MD [address] [place],  [place] [address]  Chief complaint:   Patient with a history of obstructive lung disease, chronic respiratory failure In for follow-up today  HPI:  Patient with severe chronic obstructive pulmonary disease, on oxygen supplementation Hypercapnic respiratory failure on BiPAP at night  Continues to use BiPAP on a regular basis On Trelegy, compliant with treatment  Had questions regarding desaturations with activity, history of atrial fibrillation  Overall feels well Remains active Goes to the gym about 5 days a week  Continues with an oxygen concentrator  History of chronic respiratory failure-has been on oxygen at 2 L She checks her oxygen on a regular basis, off oxygen she runs as low as 83, mostly keeps it in the low 90s with oxygen at 2 L  History of atrial fibrillation  History of chronic respiratory failure, was started on BiPAP-has been on BiPAP for 3-4 years   Outpatient Encounter Medications as of 02/06/2023  Medication Sig   albuterol (VENTOLIN HFA) 108 (90 Base) MCG/ACT inhaler INHALE 2 PUFFS INTO THE LUNGS EVERY 6 HOURS AS NEEDED FOR WHEEZING OR SHORTNESS OF BREATH   apixaban (ELIQUIS) 5 MG TABS tablet Take 1 tablet (5 mg total) by mouth 2 (two) times daily.   atorvastatin (LIPITOR) 20 MG tablet TAKE ONE TABLET BY MOUTH DAILY   carboxymethylcellulose (REFRESH PLUS) 0.5 % SOLN 1 drop 3 (three) times daily as needed.   diltiazem (CARDIZEM CD) 120 MG 24 hr capsule TAKE 1 CAPSULE BY MOUTH DAILY ** KEEP UPCOMING APPOINTMENT FOR FUTURE REFILLS**   donepezil (ARICEPT) 10 MG tablet TAKE ONE TABLET BY MOUTH EVERY NIGHT AT BEDTIME   imipramine (TOFRANIL) 50 MG tablet TAKE ONE TO TWO TABLETS BY MOUTH EVERY NIGHT AT BEDTIME   levothyroxine (SYNTHROID) 50 MCG tablet TAKE ONE TABLET BY MOUTH  DAILY BEFORE BREAKFAST   TRELEGY ELLIPTA 100-62.5-25 MCG/ACT AEPB INHALE 1 PUFF BY MOUTH DAILY   No facility-administered encounter medications on file as of 02/06/2023.    Allergies as of 02/06/2023 - Review Complete 02/06/2023  Allergen Reaction Noted   Codeine Swelling 02/19/2018    Past Medical History:  Diagnosis Date   A-fib [facility])    COPD (chronic obstructive pulmonary disease) (HCC)    Hyperlipidemia    Hypertension     Past Surgical History:  Procedure Laterality Date   CAROTID ANGIOGRAM      No family history on file.  Social History   Socioeconomic History   Marital status: Single    Spouse name: Not on file   Number of children: Not on file   Years of education: Not on file   Highest education level: Not on file  Occupational History   Not on file  Tobacco Use   Smoking status: Former    Current packs/day: 0.00    Average packs/day: 0.3 packs/day for 50.0 years (12.5 ttl pk-yrs)    Types: Cigarettes    Start date: 04/19/1971    Quit date: 04/18/2021    Years since quitting: 1.8   Smokeless tobacco: Never  Vaping Use   Vaping status: Never Used  Substance and Sexual Activity   Alcohol use: Not Currently   Drug use: Never   Sexual activity: Not on file  Other Topics Concern   Not on file  Social History Narrative  Not on file   Social Determinants of Health   Financial Resource Strain: Low Risk  (07/06/2022)   Overall Financial Resource Strain (CARDIA)    Difficulty of Paying Living Expenses: Not hard at all  Food Insecurity: No Food Insecurity (07/06/2022)   Hunger Vital Sign    Worried About Running Out of Food in the Last Year: Never true    Ran Out of Food in the Last Year: Never true  Transportation Needs: No Transportation Needs (07/06/2022)   PRAPARE - Administrator, Civil Service (Medical): No    Lack of Transportation (Non-Medical): No  Physical Activity: Sufficiently Active (07/06/2022)   Exercise Vital Sign    Days of  Exercise per Week: 5 days    Minutes of Exercise per Session: 90 min  Stress: No Stress Concern Present (07/06/2022)   [employer] of Occupational Health - Occupational Stress Questionnaire    Feeling of Stress : Not at all  Social Connections: Moderately Isolated (10/04/2020)   Social Connection and Isolation Panel [NHANES]    Frequency of Communication with Friends and Family: More than three times a week    Frequency of Social Gatherings with Friends and Family: Once a week    Attends Religious Services: Never    Database administrator or Organizations: Yes    Attends Banker Meetings: 1 to 4 times per year    Marital Status: Widowed  Intimate Partner Violence: Not At Risk (10/04/2020)   Humiliation, Afraid, Rape, and Kick questionnaire    Fear of Current or Ex-Partner: No    Emotionally Abused: No    Physically Abused: No    Sexually Abused: No    Review of Systems  Constitutional: Negative.   HENT: Negative.    Eyes: Negative.   Respiratory:  Positive for shortness of breath. Negative for wheezing.   Cardiovascular: Negative.   All other systems reviewed and are negative.   Vitals:   02/06/23 1039  BP: 130/72  Pulse: 68  Temp: (!) 96.9 F (36.1 C)  SpO2: 97%     Physical Exam Constitutional:      Appearance: Normal appearance. She is well-developed.  HENT:     Head: Normocephalic and atraumatic.  Eyes:     General:        Right eye: No discharge.        Left eye: No discharge.     Pupils: Pupils are equal, round, and reactive to light.  Neck:     Thyroid: No thyromegaly.     Trachea: No tracheal deviation.  Cardiovascular:     Rate and Rhythm: Normal rate and regular rhythm.  Pulmonary:     Effort: Pulmonary effort is normal. No respiratory distress.     Breath sounds: No stridor. No wheezing, rhonchi or rales.  Musculoskeletal:     Cervical back: No rigidity.  Neurological:     Mental Status: She is alert.     Data  Reviewed: Records from previous primary physician reviewed Had a CT scan done in 2019 showing some granulomatous disease-stable from previous No PFT  BiPAP compliance not available but uses it nightly  Assessment:   Chronic respiratory failure Obstructive lung disease -Continue oxygen supplementation as needed to keep saturations over 90% -Continue BiPAP at night  Continue Trelegy  Samples of Trelegy provided  Atrial fibrillation appears controlled   Plan/Recommendations:  Continue oxygen supplementation  Continue BiPAP  Encouraged to continue exercising regularly  Follow-up in 6 months  Concerns regarding  transient desaturations addressed   Virl Diamond MD [facility] 02/06/2023, 10:47 AM  CC: Mliss Sax,*

## 2023-02-11 ENCOUNTER — Telehealth: Payer: Self-pay | Admitting: Pulmonary Disease

## 2023-02-11 DIAGNOSIS — J9611 Chronic respiratory failure with hypoxia: Secondary | ICD-10-CM

## 2023-02-11 NOTE — Telephone Encounter (Signed)
[facility] saying  they need new orders for O2 she is switching 02 company's phone#[phone] or [phone]

## 2023-02-13 NOTE — Telephone Encounter (Signed)
[facility] order for oxygen supplementation, she uses 2 L

## 2023-02-14 DIAGNOSIS — M545 Low back pain, unspecified: Secondary | ICD-10-CM | POA: Diagnosis not present

## 2023-02-14 NOTE — Telephone Encounter (Signed)
Triage, [facility] calling asking if Dr. can send RX. Please see below and send.   [phone]

## 2023-02-14 NOTE — Telephone Encounter (Signed)
Order has not been received by [facility]

## 2023-02-14 NOTE — Telephone Encounter (Signed)
Order has been placed to [employer] for oxygen supplementation, she uses 2 L   Nothing else further needed.

## 2023-02-15 ENCOUNTER — Telehealth: Payer: Self-pay | Admitting: Family Medicine

## 2023-02-15 DIAGNOSIS — E7800 Pure hypercholesterolemia, unspecified: Secondary | ICD-10-CM

## 2023-02-15 DIAGNOSIS — Z0000 Encounter for general adult medical examination without abnormal findings: Secondary | ICD-10-CM

## 2023-02-15 DIAGNOSIS — E039 Hypothyroidism, unspecified: Secondary | ICD-10-CM

## 2023-02-15 NOTE — Telephone Encounter (Signed)
You are awesome

## 2023-02-15 NOTE — Telephone Encounter (Signed)
Pt called about oxygen order stating we need to send it in. Advised pt that O2 order sent to [facility] 8/29 per tel enc with Dr. Trena Platt office. Pt to call them to f/u.

## 2023-02-15 NOTE — Telephone Encounter (Signed)
Pt stated that the lab lost her blood. She would like to go to [facility] to have the redone. Will you make sure the order is in.

## 2023-02-16 DIAGNOSIS — J449 Chronic obstructive pulmonary disease, unspecified: Secondary | ICD-10-CM | POA: Diagnosis not present

## 2023-02-19 ENCOUNTER — Telehealth: Payer: Self-pay | Admitting: Family Medicine

## 2023-02-19 ENCOUNTER — Other Ambulatory Visit (INDEPENDENT_AMBULATORY_CARE_PROVIDER_SITE_OTHER): Payer: Medicare Other

## 2023-02-19 ENCOUNTER — Telehealth: Payer: Self-pay | Admitting: Pulmonary Disease

## 2023-02-19 DIAGNOSIS — E7800 Pure hypercholesterolemia, unspecified: Secondary | ICD-10-CM

## 2023-02-19 DIAGNOSIS — Z0000 Encounter for general adult medical examination without abnormal findings: Secondary | ICD-10-CM

## 2023-02-19 DIAGNOSIS — R809 Proteinuria, unspecified: Secondary | ICD-10-CM | POA: Insufficient documentation

## 2023-02-19 DIAGNOSIS — E039 Hypothyroidism, unspecified: Secondary | ICD-10-CM

## 2023-02-19 LAB — BASIC METABOLIC PANEL
BUN: 32 mg/dL — ABNORMAL HIGH (ref 6–23)
CO2: 27 meq/L (ref 19–32)
Calcium: 9.1 mg/dL (ref 8.4–10.5)
Chloride: 102 meq/L (ref 96–112)
Creatinine, Ser: 1.05 mg/dL (ref 0.40–1.20)
GFR: 48.39 mL/min — ABNORMAL LOW (ref >60.00)
Glucose, Bld: 103 mg/dL — ABNORMAL HIGH (ref 70–99)
Potassium: 4.6 meq/L (ref 3.5–5.1)
Sodium: 137 meq/L (ref 135–145)

## 2023-02-19 LAB — CBC WITH DIFFERENTIAL/PLATELET
Basophils Absolute: 0 10*3/uL (ref 0.0–0.1)
Basophils Relative: 0.5 % (ref 0.0–3.0)
Eosinophils Absolute: 0.2 10*3/uL (ref 0.0–0.7)
Eosinophils Relative: 2 % (ref 0.0–5.0)
HCT: 39.8 % (ref 36.0–46.0)
Hemoglobin: 12.6 g/dL (ref 12.0–15.0)
Lymphocytes Relative: 18.3 % (ref 12.0–46.0)
Lymphs Abs: 1.7 10*3/uL (ref 0.7–4.0)
MCHC: 31.7 g/dL (ref 30.0–36.0)
MCV: 90.4 fl (ref 78.0–100.0)
Monocytes Absolute: 0.7 10*3/uL (ref 0.1–1.0)
Monocytes Relative: 6.9 % (ref 3.0–12.0)
Neutro Abs: 6.9 10*3/uL (ref 1.4–7.7)
Neutrophils Relative %: 72.3 % (ref 43.0–77.0)
Platelets: 331 10*3/uL (ref 150.0–400.0)
RBC: 4.41 Mil/uL (ref 3.87–5.11)
RDW: 14.9 % (ref 11.5–15.5)
WBC: 9.5 10*3/uL (ref 4.0–10.5)

## 2023-02-19 LAB — LDL CHOLESTEROL, DIRECT: Direct LDL: 64 mg/dL

## 2023-02-19 NOTE — Telephone Encounter (Signed)
Tiffany Jennings from [facility] needs the prescription of oxygen,equipment and last visit notes so that the patient can continue to receive supplies.

## 2023-02-19 NOTE — Telephone Encounter (Signed)
Fine with me but would need to keep same PCP until [facility] visit

## 2023-02-19 NOTE — Addendum Note (Signed)
Addended by: Andrez Grime on: 02/19/2023 01:38 PM   Modules accepted: Orders

## 2023-02-19 NOTE — Telephone Encounter (Signed)
Spoke to the patient and she states that she is not having any symptoms but the urine kidney test that was sent to her by her insurance shows that that her albumin levels were elevated and that she needed to get another urine test done by her provider.

## 2023-02-19 NOTE — Telephone Encounter (Signed)
Patient is aware and will go over to the [employer]

## 2023-02-19 NOTE — Progress Notes (Signed)
Cardiology Office Note:    Date:  02/22/2023   ID:  Tiffany Jennings, Tiffany Jennings 1934-10-08, MRN [id number]  PCP:  Tiffany Sax, MD  Cardiologist:  None  Electrophysiologist:  None   Referring MD: Tiffany Jennings   No chief complaint on file.   History of Present Illness:    Tiffany Jennings is a 88 y.o. female with a hx of atrial fibrillation on Eliquis, COPD on oxygen, hypertension, hypothyroidism, hyperlipidemia, DVT/PE who presents for follow-up.  She was initially seen on 03/30/2019 for evaluation of atrial fibrillation.  She had moved to the area from [facility].  Was in hospital for pneumonia several years ago and told she had AF.   She denies any symptoms from atrial fibrillation, and does not think she has had any AF since her hospitalization.  She has been on amiodarone 100 mg daily.  The notes from her PCP in [place] indicate that amiodarone was to be discontinued due to her chronic respiratory disease.  However she continued to take amiodarone.  She underwent a TTE in July 2020, which showed normal EF, mild concentric hypertrophy, moderate diastolic dysfunction, no significant valvular disease reported.  She has been on Eliquis and diltiazem for her AF.  Amiodarone was discontinued at initial clinic visit in 03/30/2019.  Zio patch x14 days on 07/29/2019 showed very frequent PACs (18.1% of beats) with numerous episodes of SVT, longest lasting 7 beats.  No evidence of atrial fibrillation.  Echocardiogram on 08/12/2019 showed normal biventricular function, no significant valvular disease.  Had appointment on 08/16/2020 and noted to have significant orthostatic hypotension.  Her Lasix and diltiazem were discontinued.  Subsequently presented on 3/8 with atrial flutter, rate 150 bpm.  Diltiazem was restarted and repeat EKG on 3/9 showed rates had improved to 80s.  At follow-up visit on 4/1 she was back in sinus rhythm.  Zio patch x7 days on 10/07/2020 showed 2564 episodes of SVT,  longest lasting 15 beats and frequent PACs (10.3% of beats), supraventricular couplets (16.1%), and triplets (6.6%).  Echocardiogram 01/26/2021 showed normal biventricular function, grade 1 diastolic dysfunction, no significant valvular disease.  Quit smoking.  Since last clinic visit, she reports she is doing well.  She continues to go to the gym about 5 days/week.  Reports some lightheadedness with standing, denies any syncope.  Denies any chest pain or dyspnea.  No lower extremity edema.  She is taking Eliquis, denies any bleeding issues.    Wt Readings from Last 3 Encounters:  02/22/23 169 lb 9.6 oz (76.9 kg)  02/06/23 171 lb (77.6 kg)  01/28/23 171 lb (77.6 kg)     Past Medical History:  Diagnosis Date   A-fib (HCC)    COPD (chronic obstructive pulmonary disease) (HCC)    Hyperlipidemia    Hypertension     Past Surgical History:  Procedure Laterality Date   CAROTID ANGIOGRAM      Current Medications: Current Meds  Medication Sig   albuterol (VENTOLIN HFA) 108 (90 Base) MCG/ACT inhaler INHALE 2 PUFFS INTO THE LUNGS EVERY 6 HOURS AS NEEDED FOR WHEEZING OR SHORTNESS OF BREATH   apixaban (ELIQUIS) 5 MG TABS tablet Take 1 tablet (5 mg total) by mouth 2 (two) times daily.   atorvastatin (LIPITOR) 20 MG tablet TAKE ONE TABLET BY MOUTH DAILY   carboxymethylcellulose (REFRESH PLUS) 0.5 % SOLN 1 drop 3 (three) times daily as needed.   diltiazem (CARDIZEM CD) 120 MG 24 hr capsule TAKE 1 CAPSULE BY MOUTH DAILY **  KEEP UPCOMING APPOINTMENT FOR FUTURE REFILLS**   donepezil (ARICEPT) 10 MG tablet TAKE ONE TABLET BY MOUTH EVERY NIGHT AT BEDTIME   imipramine (TOFRANIL) 50 MG tablet TAKE ONE TO TWO TABLETS BY MOUTH EVERY NIGHT AT BEDTIME   levothyroxine (SYNTHROID) 50 MCG tablet TAKE ONE TABLET BY MOUTH DAILY BEFORE BREAKFAST   TRELEGY ELLIPTA 100-62.5-25 MCG/ACT AEPB INHALE 1 PUFF BY MOUTH DAILY     Allergies:   Codeine   Social History   Socioeconomic History   Marital status: Single     Spouse name: Not on file   Number of children: Not on file   Years of education: Not on file   Highest education level: Not on file  Occupational History   Not on file  Tobacco Use   Smoking status: Former    Current packs/day: 0.00    Average packs/day: 0.3 packs/day for 50.0 years (12.5 ttl pk-yrs)    Types: Cigarettes    Start date: 04/19/1971    Quit date: 04/18/2021    Years since quitting: 1.8   Smokeless tobacco: Never  Vaping Use   Vaping status: Never Used  Substance and Sexual Activity   Alcohol use: Not Currently   Drug use: Never   Sexual activity: Not on file  Other Topics Concern   Not on file  Social History Narrative   Not on file   Social Determinants of Health   Financial Resource Strain: Low Risk  (07/06/2022)   Overall Financial Resource Strain (CARDIA)    Difficulty of Paying Living Expenses: Not hard at all  Food Insecurity: No Food Insecurity (07/06/2022)   Hunger Vital Sign    Worried About Running Out of Food in the Last Year: Never true    Ran Out of Food in the Last Year: Never true  Transportation Needs: No Transportation Needs (07/06/2022)   PRAPARE - Administrator, Civil Service (Medical): No    Lack of Transportation (Non-Medical): No  Physical Activity: Sufficiently Active (07/06/2022)   Exercise Vital Sign    Days of Exercise per Week: 5 days    Minutes of Exercise per Session: 90 min  Stress: No Stress Concern Present (07/06/2022)   [employer] of Occupational Health - Occupational Stress Questionnaire    Feeling of Stress : Not at all  Social Connections: Moderately Isolated (10/04/2020)   Social Connection and Isolation Panel [NHANES]    Frequency of Communication with Friends and Family: More than three times a week    Frequency of Social Gatherings with Friends and Family: Once a week    Attends Religious Services: Never    Database administrator or Organizations: Yes    Attends Banker Meetings: 1 to  4 times per year    Marital Status: Widowed     Family History: No relevant family history of heart disease  ROS:   Please see the history of present illness.    All other systems reviewed and are negative.  EKGs/Labs/Other Studies Reviewed:    The following studies were reviewed today:  ECHO 08/12/2019:   IMPRESSIONS:  1. Left ventricular ejection fraction, by estimation, is 55 to 60%. The  left ventricle has normal function. The left ventricle has no regional  wall motion abnormalities. There is mild concentric left ventricular  hypertrophy. Left ventricular diastolic  function could not be evaluated.   2. Right ventricular systolic function is normal. The right ventricular  size is normal. Tricuspid regurgitation signal is inadequate for  assessing  PA pressure.   3. The mitral valve is normal in structure and function. Trivial mitral  valve regurgitation. No evidence of mitral stenosis.   4. The aortic valve is tricuspid. Aortic valve regurgitation is not  visualized. No aortic stenosis is present.   5. The inferior vena cava is normal in size with greater than 50%  respiratory variability, suggesting right atrial pressure of 3 mmHg.   10/07/20: LONG TERM MONITOR  Study Highlights   Very frequent PACs (18.1% of beats), with numerous episodes of SVT (longest lasting 7 beats) No atrial fibrillation   14 days of data recorded on Zio monitor. Patient had a min HR of 50 bpm, max HR of 176 bpm, and avg HR of 68 bpm. Predominant underlying rhythm was Sinus Rhythm. No VT, atrial fibrillation, high degree block, or pauses noted. 926 episodes of SVT, longest lasting 7 beats.  Isolated ventricular ectopy was rare (<1%). Very frequent PACs (18.1% of beats).  There were 7 triggered events, which corresponded to sinus rhythm plus PACs.     04/13/2019: vas [place] le artery seg  Summary:  Right: Resting right ankle-brachial index is within normal range. No  evidence of significant right  lower extremity arterial disease. The right  toe-brachial index is normal.   Left: Resting left ankle-brachial index is within normal range. No  evidence of significant left lower extremity arterial disease. The left  toe-brachial index is abnormal.   EKG:   02/22/2023: Normal sinus rhythm with frequent PACs, rate 88, incomplete right bundle branch block 2/226/24: Normal sinus rhythm, frequent PACs, rate 83 04/16/22: Sinus rhythm, rate 83, frequent PACs, left axis deviation 01/04/21:sinus rhythm with PACs rate 78 bpm,  09/16/20-Sinus with PACs, rate 74 bpm   08/23/20-Atrial flutter, rate 152 bpm  Recent Labs: 03/08/2022: ALT 18 02/19/2023: BUN 32; Creatinine, Ser 1.05; Hemoglobin 12.6; Platelets 331.0; Potassium 4.6; Sodium 137; TSH 3.58  Recent Lipid Panel    Component Value Date/Time   CHOL 138 03/08/2022 1157   CHOL 177 06/27/2020 1145   TRIG 40.0 03/08/2022 1157   HDL 76.00 03/08/2022 1157   HDL 82 06/27/2020 1145   CHOLHDL 2 03/08/2022 1157   VLDL 8.0 03/08/2022 1157   LDLCALC 54 03/08/2022 1157   LDLCALC 82 06/27/2020 1145   LDLDIRECT 64.0 02/19/2023 0929    Physical Exam:    VS:  BP 130/74   Pulse 85   Ht 5\' 7"  (1.702 m)   Wt 169 lb 9.6 oz (76.9 kg)   SpO2 95%   BMI 26.56 kg/m     Wt Readings from Last 3 Encounters:  02/22/23 169 lb 9.6 oz (76.9 kg)  02/06/23 171 lb (77.6 kg)  01/28/23 171 lb (77.6 kg)     GEN:  in no acute distress, on oxygen HEENT: Normal NECK: No JVD CARDIAC: Irregular rhythm, normal rate, no murmurs, rubs, gallops RESPIRATORY: no wheezing ABDOMEN: Soft, non-tender, non-distended MUSCULOSKELETAL:  No edema; No deformity  SKIN: Warm and dry NEUROLOGIC:  Alert and oriented x 3 PSYCHIATRIC:  Normal affect   ASSESSMENT:    1. Paroxysmal atrial fibrillation (HCC)   2. Orthostatic hypotension   3. PAC (premature atrial contraction)   4. Chronic diastolic heart failure (HCC)       PLAN:    Atrial fibrillation/flutter: On diltiazem  120 mg and Eliquis 5 mg twice daily.  CHADS-VASc 4 (HTN, agex2, female).  TTE from 08/12/19 shows normal biventricular function, no significant valvular disease.  Amiodarone discontinued 03/2019 due  to risk of pulmonary toxicity given her COPD.  Zio patch x14 days on 07/29/2019 showed very frequent PACs (18.1% of beats) with numerous episodes of SVT, longest lasting 7 beats.  No evidence of atrial fibrillation.  Had recurrence of atrial flutter on 3/8 while diltiazem was held due to orthostatic hypotension, rates up to 150s.  Rate improved to 80s with restarting diltiazem and she spontaneously converted to sinus rhythm.  Zio patch x7 days on 10/07/2020 showed no Afib but 2564 episodes of SVT, longest lasting 15 beats and frequent PACs (10.3% of beats), supraventricular couplets (16.1%), and triplets (6.6%).  Echocardiogram 01/26/2021 showed normal biventricular function, grade 1 diastolic dysfunction, no significant valvular disease. -Continue Eliquis 5 mg twice daily -Continue diltiazem 120 mg daily -Update echocardiogram  Orthostatic hypotension: BP dropped from 133/68 lying to 87/46 at clinic appointment on 08/19/20.  She had been taking Lasix 20 mg 3 times weekly, have discontinued.   Diltiazem has been held but was restarted given her atrial flutter with RVR.  Suspect due to Lasix use, has been discontinued.  Appears improved.  Advised to stay hydrated and stand slowly  Frequent PACs/SVT: Zio patch x14 days on 07/29/2019 showed very frequent PACs (18.1% of beats) with numerous episodes of SVT, longest lasting 7 beats.  Continue diltiazem.  TSH was low, hypothyroidism may have been contributing to increased ectopy, her levothyroxine dose was decreased.  TSH normal on 06/27/2020.  Zio patch x7 days on 10/07/2020 showed no Afib but 2564 episodes of SVT, longest lasting 15 beats and frequent PACs (10.3% of beats), supraventricular couplets (16.1%), and triplets (6.6%). -Continue diltiazem as above  Chronic  diastolic heart failure: Was on Lasix 20 mg 3 times weekly.  Discontinued Lasix due to orthostatic hypotension as above.  Hypertension: Presented with orthostatic hypotension, held diltiazem but restarted for atrial flutter as above.  Appears controlled.  Hyperlipidemia: On atorvastatin 10 mg.  LDL 105 on 12/28/2019, atorvastatin increased to 20 mg daily.  LDL 54 on 03/08/22  Tobacco use: quit smoking in 2022, encouraged continued cessation  Leg pain: ABIs normal 03/2019, no evidence of PAD  COPD: on home oxygen.  Follows with pulmonology.   RTC in 6 months   Medication Adjustments/Labs and Tests Ordered: Current medicines are reviewed at length with the patient today.  Concerns regarding medicines are outlined above.  Orders Placed This Encounter  Procedures   EKG 12-Lead   ECHOCARDIOGRAM COMPLETE    No orders of the defined types were placed in this encounter.    Patient Instructions  Medication Instructions:  Continue same medications *If you need a refill on your cardiac medications before your next appointment, please call your pharmacy*   Lab Work: None ordered   Testing/Procedures: Echo   Follow-Up: At [facility], you and your health needs are our priority.  As part of our continuing mission to provide you with exceptional heart care, we have created designated Provider Care Teams.  These Care Teams include your primary Cardiologist (physician) and Advanced Practice Providers (APPs -  Physician Assistants and Nurse Practitioners) who all work together to provide you with the care you need, when you need it.  We recommend signing up for the patient portal called "MyChart".  Sign up information is provided on this After Visit Summary.  MyChart is used to connect with patients for Virtual Visits (Telemedicine).  Patients are able to view lab/test results, encounter notes, upcoming appointments, etc.  Non-urgent messages can be sent to your provider as well.  To learn more about what you can do with MyChart, go to [email or web address].    Your next appointment:  6 months    Call in Nov to schedule Feb appointment    Provider:  Dr.Naidelin Gugliotta        Signed, Little Ishikawa, MD  02/22/2023 10:54 AM    [facility]

## 2023-02-19 NOTE — Telephone Encounter (Signed)
Patient would like to transition from seeing Dr. Doreene Burke to Dr. Okey Dupre. She said the [facility] office is significantly closer to her home. Are both providers okay with this transition? Best callback is [phone].

## 2023-02-19 NOTE — Telephone Encounter (Signed)
Order fax'd by you, the other [facility], but Dr. Val Eagle did not sign. Please have signed and resend.

## 2023-02-19 NOTE — Telephone Encounter (Signed)
Pt called and said she think she needs a urine test because she took a kidney test at home and it came back positive . Pt would like for you to call her

## 2023-02-20 LAB — TSH: TSH: 3.58 u[IU]/mL (ref 0.35–5.50)

## 2023-02-22 ENCOUNTER — Encounter: Payer: Self-pay | Admitting: Cardiology

## 2023-02-22 ENCOUNTER — Ambulatory Visit: Payer: Medicare Other | Attending: Cardiology | Admitting: Cardiology

## 2023-02-22 ENCOUNTER — Other Ambulatory Visit: Payer: Medicare Other

## 2023-02-22 VITALS — BP 130/74 | HR 85 | Ht 67.0 in | Wt 169.6 lb

## 2023-02-22 DIAGNOSIS — I480 Paroxysmal atrial fibrillation: Secondary | ICD-10-CM | POA: Diagnosis not present

## 2023-02-22 DIAGNOSIS — I5032 Chronic diastolic (congestive) heart failure: Secondary | ICD-10-CM | POA: Diagnosis not present

## 2023-02-22 DIAGNOSIS — I491 Atrial premature depolarization: Secondary | ICD-10-CM | POA: Diagnosis not present

## 2023-02-22 DIAGNOSIS — R809 Proteinuria, unspecified: Secondary | ICD-10-CM

## 2023-02-22 DIAGNOSIS — I951 Orthostatic hypotension: Secondary | ICD-10-CM | POA: Diagnosis not present

## 2023-02-22 LAB — MICROALBUMIN / CREATININE URINE RATIO
Creatinine,U: 57.2 mg/dL
Microalb Creat Ratio: 5.3 mg/g (ref 0.0–30.0)
Microalb, Ur: 3 mg/dL — ABNORMAL HIGH (ref 0.0–1.9)

## 2023-02-22 NOTE — Patient Instructions (Addendum)
Medication Instructions:  Continue same medications *If you need a refill on your cardiac medications before your next appointment, please call your pharmacy*   Lab Work: None ordered   Testing/Procedures: Echo   Follow-Up: At [facility], you and your health needs are our priority.  As part of our continuing mission to provide you with exceptional heart care, we have created designated Provider Care Teams.  These Care Teams include your primary Cardiologist (physician) and Advanced Practice Providers (APPs -  Physician Assistants and Nurse Practitioners) who all work together to provide you with the care you need, when you need it.  We recommend signing up for the patient portal called "MyChart".  Sign up information is provided on this After Visit Summary.  MyChart is used to connect with patients for Virtual Visits (Telemedicine).  Patients are able to view lab/test results, encounter notes, upcoming appointments, etc.  Non-urgent messages can be sent to your provider as well.   To learn more about what you can do with MyChart, go to [email or web address].    Your next appointment:  6 months    Call in Nov to schedule Feb appointment    Provider:  Dr.Schumann

## 2023-02-25 NOTE — Telephone Encounter (Signed)
Order refaxed to [employer]

## 2023-03-05 ENCOUNTER — Telehealth: Payer: Self-pay | Admitting: Family Medicine

## 2023-03-05 NOTE — Telephone Encounter (Signed)
Pt said she was her a week ago and done labs and never heard a response back of her results. Please give her a call

## 2023-03-06 ENCOUNTER — Telehealth: Payer: Self-pay | Admitting: Family Medicine

## 2023-03-06 NOTE — Telephone Encounter (Signed)
Pts daughter called about lab and urine results. Tried to transfer her but she hung up after 2 min.

## 2023-03-07 ENCOUNTER — Telehealth: Payer: Self-pay | Admitting: Pulmonary Disease

## 2023-03-07 DIAGNOSIS — G4733 Obstructive sleep apnea (adult) (pediatric): Secondary | ICD-10-CM

## 2023-03-07 NOTE — Telephone Encounter (Signed)
Called daughter back to address questions/concerns.

## 2023-03-07 NOTE — Telephone Encounter (Signed)
Patient needs a prescription for her bipap machine and the lead in for the bipap machine so it can be connected to her oxygen condenser.   It should have been requested by [employer] already as well.

## 2023-03-08 NOTE — Telephone Encounter (Signed)
LVM informing her that her mom's UA was fine, no concerns and normal kidney function per Dr. Doreene Burke.

## 2023-03-08 NOTE — Telephone Encounter (Signed)
Called the pt with results. No further questions or concerns. She asked that I call her daughter and let her know as well.

## 2023-03-11 NOTE — Telephone Encounter (Signed)
Patient checking on message for oxygen. Patient states almost out of oxygen. Patient phone number is [phone].

## 2023-03-12 NOTE — Telephone Encounter (Signed)
Called and spoke with patient, she is using [employer] now for her DME.  She states that the current connector that attaches to her oxygen concentrator and the other end to her BIPAP machine are the same ends.  She does not have the correct connector to bleed her oxygen into her BIPAP.  Advised that I would send over an order urgently to get her the correct connector so she can use her oxygen with her BIPAP.  She verbalized understanding.  Order placed.  Nothing further needed.

## 2023-03-13 ENCOUNTER — Ambulatory Visit (HOSPITAL_COMMUNITY): Payer: Medicare Other | Attending: Cardiology

## 2023-03-13 DIAGNOSIS — I480 Paroxysmal atrial fibrillation: Secondary | ICD-10-CM | POA: Diagnosis not present

## 2023-03-13 LAB — ECHOCARDIOGRAM COMPLETE
Area-P 1/2: 3.24 cm2
S' Lateral: 2.9 cm

## 2023-03-17 ENCOUNTER — Other Ambulatory Visit: Payer: Self-pay | Admitting: Family Medicine

## 2023-03-17 DIAGNOSIS — I480 Paroxysmal atrial fibrillation: Secondary | ICD-10-CM

## 2023-03-19 ENCOUNTER — Other Ambulatory Visit: Payer: Self-pay | Admitting: Family Medicine

## 2023-03-19 DIAGNOSIS — Z1231 Encounter for screening mammogram for malignant neoplasm of breast: Secondary | ICD-10-CM

## 2023-03-21 DIAGNOSIS — M545 Low back pain, unspecified: Secondary | ICD-10-CM | POA: Diagnosis not present

## 2023-04-21 ENCOUNTER — Other Ambulatory Visit: Payer: Self-pay | Admitting: Family Medicine

## 2023-04-21 DIAGNOSIS — F339 Major depressive disorder, recurrent, unspecified: Secondary | ICD-10-CM

## 2023-04-23 ENCOUNTER — Ambulatory Visit
Admission: RE | Admit: 2023-04-23 | Discharge: 2023-04-23 | Disposition: A | Discharge status: Home / Self Care | Payer: Medicare Other | Source: Ambulatory Visit | Attending: Family Medicine | Admitting: Family Medicine

## 2023-04-23 DIAGNOSIS — Z1231 Encounter for screening mammogram for malignant neoplasm of breast: Secondary | ICD-10-CM

## 2023-05-02 ENCOUNTER — Telehealth: Payer: Self-pay | Admitting: Pulmonary Disease

## 2023-05-02 NOTE — Telephone Encounter (Addendum)
Needs OV & Qualifying oxygen testing (old one works fine)   Fax: [phone]

## 2023-05-18 ENCOUNTER — Other Ambulatory Visit: Payer: Self-pay | Admitting: Family Medicine

## 2023-05-31 ENCOUNTER — Encounter: Payer: Medicare Other | Admitting: Family Medicine

## 2023-06-17 ENCOUNTER — Telehealth: Payer: Self-pay | Admitting: Family Medicine

## 2023-06-17 ENCOUNTER — Encounter: Payer: Medicare Other | Admitting: Family Medicine

## 2023-06-17 NOTE — Telephone Encounter (Signed)
12.30.24 no show

## 2023-06-20 ENCOUNTER — Telehealth: Payer: Self-pay | Admitting: Pulmonary Disease

## 2023-06-20 NOTE — Telephone Encounter (Signed)
 Patient needs oxygen prescription for her bipap machine to be sent to her new [employer]. Patient would like a call once this is completed [phone] Member ID number:[id number]  [address]: [id number] [facility] [address]

## 2023-06-24 NOTE — Telephone Encounter (Signed)
 Patient needs oxygen  prescription for her bipap machine to be sent to her new [employer]. Patient would like a call once this is completed [phone] Member ID number:[id number]  [address]: [id number] [place]  [address]  Message routed to [facility] to assist.

## 2023-06-25 ENCOUNTER — Other Ambulatory Visit: Payer: Self-pay | Admitting: *Deleted

## 2023-06-25 DIAGNOSIS — G4733 Obstructive sleep apnea (adult) (pediatric): Secondary | ICD-10-CM

## 2023-06-25 NOTE — Telephone Encounter (Signed)
 PT ret call., Chantel said she'd call her back.

## 2023-06-25 NOTE — Telephone Encounter (Signed)
 [employer] [facility] now calling for Dr. Laury Axon RX sent for BIPAP machine. [employer] is @ [phone]

## 2023-06-26 NOTE — Telephone Encounter (Addendum)
 06/17/2023 1st no show, letter sent via [facility]

## 2023-06-27 NOTE — Telephone Encounter (Signed)
 Pt notified order sent to [employer].NFN

## 2023-07-04 ENCOUNTER — Other Ambulatory Visit: Payer: Self-pay | Admitting: Pulmonary Disease

## 2023-07-04 ENCOUNTER — Telehealth: Payer: Self-pay | Admitting: Pulmonary Disease

## 2023-07-04 DIAGNOSIS — J9611 Chronic respiratory failure with hypoxia: Secondary | ICD-10-CM

## 2023-07-04 NOTE — Telephone Encounter (Signed)
She can be scheduled sooner from my perspective  ABG will need to be done in the hospital-ordered  She can come into the office for walk  Order overnight oximetry-ordered

## 2023-07-04 NOTE — Telephone Encounter (Addendum)
Patient needs and ABG and ONO to qualify them for POC and Home concentrator per [employer]

## 2023-07-06 ENCOUNTER — Other Ambulatory Visit: Payer: Self-pay | Admitting: Cardiology

## 2023-07-08 ENCOUNTER — Other Ambulatory Visit: Payer: Self-pay | Admitting: Pulmonary Disease

## 2023-07-08 DIAGNOSIS — G4734 Idiopathic sleep related nonobstructive alveolar hypoventilation: Secondary | ICD-10-CM

## 2023-07-11 ENCOUNTER — Other Ambulatory Visit: Payer: Self-pay | Admitting: Pulmonary Disease

## 2023-07-11 ENCOUNTER — Other Ambulatory Visit: Payer: Self-pay | Admitting: Family Medicine

## 2023-07-11 DIAGNOSIS — E039 Hypothyroidism, unspecified: Secondary | ICD-10-CM

## 2023-07-12 ENCOUNTER — Other Ambulatory Visit: Payer: Self-pay

## 2023-07-12 DIAGNOSIS — I480 Paroxysmal atrial fibrillation: Secondary | ICD-10-CM

## 2023-07-12 MED ORDER — APIXABAN 5 MG PO TABS: 5.0000 mg | ORAL_TABLET | Freq: Two times a day (BID) | ORAL | 3 refills | Status: DC

## 2023-07-16 ENCOUNTER — Encounter: Payer: Self-pay | Admitting: Pulmonary Disease

## 2023-07-16 ENCOUNTER — Ambulatory Visit (HOSPITAL_COMMUNITY)
Admission: RE | Admit: 2023-07-16 | Discharge: 2023-07-16 | Disposition: A | Discharge status: Home / Self Care | Payer: Medicare HMO | Source: Ambulatory Visit | Attending: Pulmonary Disease | Admitting: Pulmonary Disease

## 2023-07-16 ENCOUNTER — Encounter (HOSPITAL_COMMUNITY): Payer: Medicare HMO

## 2023-07-16 DIAGNOSIS — J9611 Chronic respiratory failure with hypoxia: Secondary | ICD-10-CM | POA: Diagnosis not present

## 2023-07-16 LAB — BLOOD GAS, ARTERIAL
Acid-Base Excess: 1.2 mmol/L (ref 0.0–2.0)
Bicarbonate: 25 mmol/L (ref 20.0–28.0)
Drawn by: 21179
O2 Saturation: 98.8 %
Patient temperature: 37
pCO2 arterial: 36 mm[Hg] (ref 32–48)
pH, Arterial: 7.45 (ref 7.35–7.45)
pO2, Arterial: 91 mm[Hg] (ref 83–108)

## 2023-07-16 NOTE — Telephone Encounter (Signed)
Pt having ABG today  Her ONO was ordered  Msg from [facility] does not mention needing walk  She is scheduled for ov with AO 08/12/23 and there is nothing sooner available

## 2023-07-16 NOTE — Progress Notes (Signed)
Respiratory Care Note  Pt. in today for ABG on 2 LPM N.[place].  Procedure done and results in the chart

## 2023-07-23 ENCOUNTER — Telehealth: Payer: Self-pay | Admitting: Pulmonary Disease

## 2023-07-23 DIAGNOSIS — J9611 Chronic respiratory failure with hypoxia: Secondary | ICD-10-CM

## 2023-07-23 DIAGNOSIS — G4733 Obstructive sleep apnea (adult) (pediatric): Secondary | ICD-10-CM

## 2023-07-23 NOTE — Telephone Encounter (Signed)
Can the order now be placed to send it over to [employer] her ABG was done on 07/16/23 and Read by Dr. [name] for POC and home concentrator. Her O2 needs to bleed into her BIPAP. Please Advise

## 2023-07-23 NOTE — Telephone Encounter (Signed)
 Patient calling back again. I explained to the patient that there is nothing Chantel nor the front staff can do until an order is placed. She is very frustrated and angry that this has taken so long and verbalized that she would be looking for a new office. I explained to patient that this has been sent directly to her provider and marked as urgent. Patient ended phone call.

## 2023-07-24 NOTE — Telephone Encounter (Signed)
 DME referral for 2 L of oxygen  to be added to CPAP at night

## 2023-07-24 NOTE — Telephone Encounter (Signed)
Order placed. Nothing further needed. 

## 2023-07-25 NOTE — Telephone Encounter (Signed)
 I received the following message from Benson with [employer]:   This patient is a current user of [employer] but is switching to us . We either need to have her qualified for continuous O2, and if she doesn't qualify for all day use we would need a sleep study and RX for night time use that shows pt still de sats while on pap.

## 2023-07-26 NOTE — Telephone Encounter (Signed)
 I called and spoke with pt. I informed her of Tiffany Jennings's message. Pt verbalized that she was very upset and states she needs her oxygen . I informed pt that we could try and see if we can qualify her on Monday at her appointment with AO. Pt verbalized understanding. NFN. Fowarding to AO so he is aware, and I will also make note again in her chart

## 2023-07-29 ENCOUNTER — Encounter: Payer: Self-pay | Admitting: Pulmonary Disease

## 2023-07-29 ENCOUNTER — Telehealth: Payer: Self-pay | Admitting: Pulmonary Disease

## 2023-07-29 ENCOUNTER — Ambulatory Visit: Payer: Medicare HMO | Admitting: Pulmonary Disease

## 2023-07-29 VITALS — BP 138/76 | HR 105 | Ht 66.0 in | Wt 181.0 lb

## 2023-07-29 DIAGNOSIS — J9611 Chronic respiratory failure with hypoxia: Secondary | ICD-10-CM

## 2023-07-29 DIAGNOSIS — J449 Chronic obstructive pulmonary disease, unspecified: Secondary | ICD-10-CM

## 2023-07-29 NOTE — Patient Instructions (Signed)
 Your BiPAP is working well Download from the machine shows it is controlling events  Continue BiPAP at night  Continue to use a mask that fits the best, if you want to change, we need to contact the medical supply company to change the mask-there is no perfect mask  We will walk you today to see if your oxygen  drops  Prescription for oxygen  use with your BiPAP at night already sent in  Follow-up in 6 months  Continue regular exercises

## 2023-07-29 NOTE — Telephone Encounter (Signed)
 Patient would like to discuss the frequency of RX Trelegy. Patient phone number is [phone].

## 2023-07-29 NOTE — Progress Notes (Addendum)
Tiffany Jennings    [id number]    1935/02/23  Primary Care Physician:Kremer, Talmadge Coventry, MD  Referring Physician: Mliss Sax, MD [address] [place],  [place] [address]  Chief complaint:   Patient with a history of obstructive lung disease, chronic respiratory failure In for follow-up today  HPI:  Patient with severe chronic obstructive pulmonary disease, on oxygen supplementation Hypercapnic respiratory failure on BiPAP at night  Continues to tolerate BiPAP regularly On Trelegy and compliant with treatment  Exercises regularly by going to the gym  Does have a history of atrial fibrillation -Appears controlled at present  She does have oxygen supplementation to use as needed -Does use oxygen with BiPAP at night at 2 L  History of chronic respiratory failure, was started on BiPAP-has been on BiPAP for 3-4 years   Outpatient Encounter Medications as of 07/29/2023  Medication Sig   albuterol (VENTOLIN HFA) 108 (90 Base) MCG/ACT inhaler INHALE 2 PUFFS INTO THE LUNGS EVERY 6 HOURS AS NEEDED FOR WHEEZING OR SHORTNESS OF BREATH   apixaban (ELIQUIS) 5 MG TABS tablet Take 1 tablet (5 mg total) by mouth 2 (two) times daily.   atorvastatin (LIPITOR) 20 MG tablet TAKE ONE TABLET BY MOUTH DAILY   carboxymethylcellulose (REFRESH PLUS) 0.5 % SOLN 1 drop 3 (three) times daily as needed.   diltiazem (CARDIZEM CD) 120 MG 24 hr capsule TAKE 1 CAPSULE BY MOUTH DAILY   donepezil (ARICEPT) 10 MG tablet TAKE 1 TABLET BY MOUTH EVERY NIGHT AT BEDTIME   imipramine (TOFRANIL) 50 MG tablet TAKE 1 TO 2 TABLETS BY MOUTH EVERY NIGHT AT BEDTIME   levothyroxine (SYNTHROID) 50 MCG tablet TAKE 1 TABLET BY MOUTH DAILY BEFORE BREAKFAST   TRELEGY ELLIPTA 100-62.5-25 MCG/ACT AEPB INHALE 1 PUFF BY MOUTH DAILY   No facility-administered encounter medications on file as of 07/29/2023.    Allergies as of 07/29/2023 - Review Complete 07/29/2023  Allergen Reaction Noted    Codeine Swelling 02/19/2018    Past Medical History:  Diagnosis Date   A-fib (HCC)    COPD (chronic obstructive pulmonary disease) (HCC)    Hyperlipidemia    Hypertension     Past Surgical History:  Procedure Laterality Date   CAROTID ANGIOGRAM      Family History  Problem Relation Age of Onset   Breast cancer Daughter 5   Breast cancer Maternal Grandmother 35   BRCA 1/2 Neg Hx     Social History   Socioeconomic History   Marital status: Single    Spouse name: Not on file   Number of children: Not on file   Years of education: Not on file   Highest education level: Associate degree: occupational, Scientist, product/process development, or vocational program  Occupational History   Not on file  Tobacco Use   Smoking status: Former    Current packs/day: 0.00    Average packs/day: 0.3 packs/day for 50.0 years (12.5 ttl pk-yrs)    Types: Cigarettes    Start date: 04/19/1971    Quit date: 04/18/2021    Years since quitting: 2.2   Smokeless tobacco: Never  Vaping Use   Vaping status: Never Used  Substance and Sexual Activity   Alcohol use: Not Currently   Drug use: Never   Sexual activity: Not on file  Other Topics Concern   Not on file  Social History Narrative   Not on file   Social Drivers of Health   Financial Resource Strain: Medium Risk (  05/28/2023)   Overall Financial Resource Strain (CARDIA)    Difficulty of Paying Living Expenses: Somewhat hard  Food Insecurity: No Food Insecurity (05/28/2023)   Hunger Vital Sign    Worried About Running Out of Food in the Last Year: Never true    Ran Out of Food in the Last Year: Never true  Transportation Needs: No Transportation Needs (05/28/2023)   PRAPARE - Administrator, Civil Service (Medical): No    Lack of Transportation (Non-Medical): No  Physical Activity: Sufficiently Active (05/28/2023)   Exercise Vital Sign    Days of Exercise per Week: 5 days    Minutes of Exercise per Session: 90 min  Stress: No Stress Concern  Present (05/28/2023)   [employer] of Occupational Health - Occupational Stress Questionnaire    Feeling of Stress : Not at all  Social Connections: Moderately Isolated (05/28/2023)   Social Connection and Isolation Panel [NHANES]    Frequency of Communication with Friends and Family: Three times a week    Frequency of Social Gatherings with Friends and Family: Once a week    Attends Religious Services: Never    Database administrator or Organizations: Yes    Attends Engineer, structural: More than 4 times per year    Marital Status: Divorced  Intimate Partner Violence: Not At Risk (10/04/2020)   Humiliation, Afraid, Rape, and Kick questionnaire    Fear of Current or Ex-Partner: No    Emotionally Abused: No    Physically Abused: No    Sexually Abused: No    Review of Systems  Constitutional: Negative.   HENT: Negative.    Eyes: Negative.   Respiratory:  Positive for shortness of breath. Negative for wheezing.   Cardiovascular: Negative.   All other systems reviewed and are negative.   Vitals:   07/29/23 0853 07/29/23 0854  BP: (!) 142/80 138/76  Pulse: (!) 105   SpO2: 95%      Physical Exam Constitutional:      Appearance: Normal appearance. She is well-developed.  HENT:     Head: Normocephalic and atraumatic.  Eyes:     General:        Right eye: No discharge.        Left eye: No discharge.     Pupils: Pupils are equal, round, and reactive to light.  Neck:     Thyroid: No thyromegaly.     Trachea: No tracheal deviation.  Cardiovascular:     Rate and Rhythm: Normal rate and regular rhythm.  Pulmonary:     Effort: Pulmonary effort is normal. No respiratory distress.     Breath sounds: No stridor. Rales present. No wheezing or rhonchi.     Comments: Has few rales at the bases Overall decreased air movement Musculoskeletal:     Cervical back: No rigidity.  Neurological:     Mental Status: She is alert.     Data Reviewed: Records from previous  primary physician reviewed Had a CT scan done in 2019 showing some granulomatous disease-stable from previous No PFT  BiPAP compliance is excellent with adequate control of events Residual AHI of 6 Does have mask leak 97% compliance with an average use of 6 hours 53 minutes  Recent ABG reviewed showing a normal gas   Assessment:   Chronic respiratory failure Obstructive lung disease -Continue BiPAP at night -Oxygen at 2 L with BiPAP  Continue Trelegy  Atrial fibrillation -Controlled  Did desaturate with ambulation Required 1 L of  oxygen with activity  Plan/Recommendations:  Continue BiPAP at night -Oxygen with BiPAP at night  We will ambulate you in the office today to see whether you require oxygen with exercise/activity  Continue inhalers  Prescription for oxygen supplementation 1 L with activity  Call us with significant concerns  Follow-up in 6 months  I spent 30 minutes dedicated to the care of this patient on the date of this encounter to include previsit review of records, face-to-face time with the patient discussing conditions above, post visit ordering of testing,ordering medications,independentlyinterpreting results, clinical documentation with electronic health record and communicated necessary findings to members of the patient's care team    Virl Diamond MD [facility] 07/29/2023, 8:57 AM  CC: Mliss Sax,*  Addendum:  Patient was walked in the hallway during the visit Desaturated to 86% while ambulating, was placed on 1 L of oxygen with ambulation and was able to maintain saturations of 89 to 90%  -Order for 1 L of oxygen with ambulation  Virl Diamond, MD [facility] Pager: See Loretha Stapler

## 2023-08-01 ENCOUNTER — Telehealth: Payer: Self-pay | Admitting: Pulmonary Disease

## 2023-08-01 NOTE — Telephone Encounter (Signed)
Hebert Soho; Percell Locus; Kathe Becton; Randie Heinz, Clovis Riley This is for a new start. We will need sats either co-signed or copied and paste into providers note please.

## 2023-08-02 NOTE — Telephone Encounter (Signed)
Addended my note for last office visit

## 2023-08-08 NOTE — Telephone Encounter (Signed)
 Tiffany Jennings; Tiffany Jennings; Tiffany Jennings Delivery was made 08/06/2023

## 2023-08-12 ENCOUNTER — Ambulatory Visit: Payer: Medicare Other | Admitting: Pulmonary Disease

## 2023-08-13 NOTE — Progress Notes (Unsigned)
 Cardiology Office Note:    Date:  08/15/2023   ID:  Tiffany Jennings, Tiffany Jennings 12/04/1934, MRN [id number]  PCP:  Mliss Sax, MD  Cardiologist:  None  Electrophysiologist:  None   Referring MD: Mliss Sax,*   Chief Complaint  Patient presents with   Atrial Fibrillation    History of Present Illness:    Tiffany Jennings is a 89 y.o. female with a hx of atrial fibrillation on Eliquis, COPD on oxygen, hypertension, hypothyroidism, hyperlipidemia, DVT/PE who presents for follow-up.  She was initially seen on 03/30/2019 for evaluation of atrial fibrillation.  She had moved to the area from [facility].  Was in hospital for pneumonia several years ago and told she had AF.   She denies any symptoms from atrial fibrillation, and does not think she has had any AF since her hospitalization.  She has been on amiodarone 100 mg daily.  The notes from her PCP in [place] indicate that amiodarone was to be discontinued due to her chronic respiratory disease.  However she continued to take amiodarone.  She underwent a TTE in July 2020, which showed normal EF, mild concentric hypertrophy, moderate diastolic dysfunction, no significant valvular disease reported.  She has been on Eliquis and diltiazem for her AF.  Amiodarone was discontinued at initial clinic visit in 03/30/2019.  Zio patch x14 days on 07/29/2019 showed very frequent PACs (18.1% of beats) with numerous episodes of SVT, longest lasting 7 beats.  No evidence of atrial fibrillation.  Echocardiogram on 08/12/2019 showed normal biventricular function, no significant valvular disease.  Had appointment on 08/16/2020 and noted to have significant orthostatic hypotension.  Her Lasix and diltiazem were discontinued.  Subsequently presented on 3/8 with atrial flutter, rate 150 bpm.  Diltiazem was restarted and repeat EKG on 3/9 showed rates had improved to 80s.  At follow-up visit on 4/1 she was back in sinus rhythm.  Zio patch x7 days on  10/07/2020 showed 2564 episodes of SVT, longest lasting 15 beats and frequent PACs (10.3% of beats), supraventricular couplets (16.1%), and triplets (6.6%).  Echocardiogram 01/26/2021 showed normal biventricular function, grade 1 diastolic dysfunction, no significant valvular disease.  Quit smoking.  Echocardiogram 03/13/2023 showed normal biventricular function, no significant valvular disease.  Since last clinic visit, she reports is doing well.  Reports some lightheadedness with standing, denies any syncope.  Denies any chest pain or dyspnea.  Denies any lower extremity edema or palpitations.  She is taking Eliquis, denies any bleeding issues.    Wt Readings from Last 3 Encounters:  08/15/23 182 lb 3.2 oz (82.6 kg)  07/29/23 181 lb (82.1 kg)  02/22/23 169 lb 9.6 oz (76.9 kg)     Past Medical History:  Diagnosis Date   A-fib (HCC)    COPD (chronic obstructive pulmonary disease) (HCC)    Hyperlipidemia    Hypertension     Past Surgical History:  Procedure Laterality Date   CAROTID ANGIOGRAM      Current Medications: Current Meds  Medication Sig   albuterol (VENTOLIN HFA) 108 (90 Base) MCG/ACT inhaler INHALE 2 PUFFS INTO THE LUNGS EVERY 6 HOURS AS NEEDED FOR WHEEZING OR SHORTNESS OF BREATH   apixaban (ELIQUIS) 5 MG TABS tablet Take 1 tablet (5 mg total) by mouth 2 (two) times daily.   atorvastatin (LIPITOR) 20 MG tablet TAKE ONE TABLET BY MOUTH DAILY   carboxymethylcellulose (REFRESH PLUS) 0.5 % SOLN 1 drop 3 (three) times daily as needed.   diltiazem (CARDIZEM CD) 120 MG  24 hr capsule TAKE 1 CAPSULE BY MOUTH DAILY   donepezil (ARICEPT) 10 MG tablet TAKE 1 TABLET BY MOUTH EVERY NIGHT AT BEDTIME   imipramine (TOFRANIL) 50 MG tablet TAKE 1 TO 2 TABLETS BY MOUTH EVERY NIGHT AT BEDTIME   levothyroxine (SYNTHROID) 50 MCG tablet TAKE 1 TABLET BY MOUTH DAILY BEFORE BREAKFAST   TRELEGY ELLIPTA 100-62.5-25 MCG/ACT AEPB INHALE 1 PUFF BY MOUTH DAILY     Allergies:   Codeine   Social  History   Socioeconomic History   Marital status: Single    Spouse name: Not on file   Number of children: Not on file   Years of education: Not on file   Highest education level: Associate degree: occupational, Scientist, product/process development, or vocational program  Occupational History   Not on file  Tobacco Use   Smoking status: Former    Current packs/day: 0.00    Average packs/day: 0.3 packs/day for 50.0 years (12.5 ttl pk-yrs)    Types: Cigarettes    Start date: 04/19/1971    Quit date: 04/18/2021    Years since quitting: 2.3   Smokeless tobacco: Never  Vaping Use   Vaping status: Never Used  Substance and Sexual Activity   Alcohol use: Not Currently   Drug use: Never   Sexual activity: Not Currently    Partners: Male    Comment: DIVORCED  Other Topics Concern   Not on file  Social History Narrative   Not on file   Social Drivers of Health   Financial Resource Strain: Medium Risk (05/28/2023)   Overall Financial Resource Strain (CARDIA)    Difficulty of Paying Living Expenses: Somewhat hard  Food Insecurity: No Food Insecurity (05/28/2023)   Hunger Vital Sign    Worried About Running Out of Food in the Last Year: Never true    Ran Out of Food in the Last Year: Never true  Transportation Needs: No Transportation Needs (05/28/2023)   PRAPARE - Administrator, Civil Service (Medical): No    Lack of Transportation (Non-Medical): No  Physical Activity: Sufficiently Active (05/28/2023)   Exercise Vital Sign    Days of Exercise per Week: 5 days    Minutes of Exercise per Session: 90 min  Stress: No Stress Concern Present (05/28/2023)   [employer] of Occupational Health - Occupational Stress Questionnaire    Feeling of Stress : Not at all  Social Connections: Moderately Isolated (05/28/2023)   Social Connection and Isolation Panel [NHANES]    Frequency of Communication with Friends and Family: Three times a week    Frequency of Social Gatherings with Friends and Family:  Once a week    Attends Religious Services: Never    Database administrator or Organizations: Yes    Attends Engineer, structural: More than 4 times per year    Marital Status: Divorced     Family History: No relevant family history of heart disease  ROS:   Please see the history of present illness.    All other systems reviewed and are negative.  EKGs/Labs/Other Studies Reviewed:    The following studies were reviewed today:  ECHO 08/12/2019:   IMPRESSIONS:  1. Left ventricular ejection fraction, by estimation, is 55 to 60%. The  left ventricle has normal function. The left ventricle has no regional  wall motion abnormalities. There is mild concentric left ventricular  hypertrophy. Left ventricular diastolic  function could not be evaluated.   2. Right ventricular systolic function is normal. The right  ventricular  size is normal. Tricuspid regurgitation signal is inadequate for assessing  PA pressure.   3. The mitral valve is normal in structure and function. Trivial mitral  valve regurgitation. No evidence of mitral stenosis.   4. The aortic valve is tricuspid. Aortic valve regurgitation is not  visualized. No aortic stenosis is present.   5. The inferior vena cava is normal in size with greater than 50%  respiratory variability, suggesting right atrial pressure of 3 mmHg.   10/07/20: LONG TERM MONITOR  Study Highlights   Very frequent PACs (18.1% of beats), with numerous episodes of SVT (longest lasting 7 beats) No atrial fibrillation   14 days of data recorded on Zio monitor. Patient had a min HR of 50 bpm, max HR of 176 bpm, and avg HR of 68 bpm. Predominant underlying rhythm was Sinus Rhythm. No VT, atrial fibrillation, high degree block, or pauses noted. 926 episodes of SVT, longest lasting 7 beats.  Isolated ventricular ectopy was rare (<1%). Very frequent PACs (18.1% of beats).  There were 7 triggered events, which corresponded to sinus rhythm plus PACs.      04/13/2019: vas [place] le artery seg  Summary:  Right: Resting right ankle-brachial index is within normal range. No  evidence of significant right lower extremity arterial disease. The right  toe-brachial index is normal.   Left: Resting left ankle-brachial index is within normal range. No  evidence of significant left lower extremity arterial disease. The left  toe-brachial index is abnormal.   EKG:   08/15/23: sinus rhythm with frequent PACs, rate 93, RBBB 02/22/2023: Normal sinus rhythm with frequent PACs, rate 88, incomplete right bundle branch block 2/226/24: Normal sinus rhythm, frequent PACs, rate 83 04/16/22: Sinus rhythm, rate 83, frequent PACs, left axis deviation 01/04/21:sinus rhythm with PACs rate 78 bpm,  09/16/20-Sinus with PACs, rate 74 bpm   08/23/20-Atrial flutter, rate 152 bpm  Recent Labs: 02/19/2023: BUN 32; Creatinine, Ser 1.05; Hemoglobin 12.6; Platelets 331.0; Potassium 4.6; Sodium 137; TSH 3.58  Recent Lipid Panel    Component Value Date/Time   CHOL 138 03/08/2022 1157   CHOL 177 06/27/2020 1145   TRIG 40.0 03/08/2022 1157   HDL 76.00 03/08/2022 1157   HDL 82 06/27/2020 1145   CHOLHDL 2 03/08/2022 1157   VLDL 8.0 03/08/2022 1157   LDLCALC 54 03/08/2022 1157   LDLCALC 82 06/27/2020 1145   LDLDIRECT 64.0 02/19/2023 0929    Physical Exam:    VS:  BP 124/70   Pulse 83   Ht 5\' 6"  (1.676 m)   Wt 182 lb 3.2 oz (82.6 kg)   SpO2 93%   BMI 29.41 kg/m     Wt Readings from Last 3 Encounters:  08/15/23 182 lb 3.2 oz (82.6 kg)  07/29/23 181 lb (82.1 kg)  02/22/23 169 lb 9.6 oz (76.9 kg)     GEN:  in no acute distress, on oxygen HEENT: Normal NECK: No JVD CARDIAC: Irregular rhythm, normal rate, no murmurs, rubs, gallops RESPIRATORY: no wheezing ABDOMEN: Soft, non-tender, non-distended MUSCULOSKELETAL:  No edema; No deformity  SKIN: Warm and dry NEUROLOGIC:  Alert and oriented x 3 PSYCHIATRIC:  Normal affect   ASSESSMENT:    1. Paroxysmal atrial  fibrillation (HCC)   2. Essential hypertension   3. Hyperlipidemia, unspecified hyperlipidemia type   4. PAC (premature atrial contraction)      PLAN:    Atrial fibrillation/flutter: On diltiazem 120 mg and Eliquis 5 mg twice daily.  CHADS-VASc 4 (HTN, agex2, female).  TTE from 08/12/19 shows normal biventricular function, no significant valvular disease.  Amiodarone discontinued 03/2019 due to risk of pulmonary toxicity given her COPD.  Zio patch x14 days on 07/29/2019 showed very frequent PACs (18.1% of beats) with numerous episodes of SVT, longest lasting 7 beats.  No evidence of atrial fibrillation.  Had recurrence of atrial flutter on 3/8 while diltiazem was held due to orthostatic hypotension, rates up to 150s.  Rate improved to 80s with restarting diltiazem and she spontaneously converted to sinus rhythm.  Zio patch x7 days on 10/07/2020 showed no Afib but 2564 episodes of SVT, longest lasting 15 beats and frequent PACs (10.3% of beats), supraventricular couplets (16.1%), and triplets (6.6%).  Echocardiogram 03/13/2023 showed normal biventricular function, no significant valvular disease. -Continue Eliquis 5 mg twice daily -Continue diltiazem 120 mg daily  Orthostatic hypotension: BP dropped from 133/68 lying to 87/46 at clinic appointment on 08/19/20.  She had been taking Lasix 20 mg 3 times weekly, have discontinued.   Diltiazem has been held but was restarted given her atrial flutter with RVR.  Suspect due to Lasix use, has been discontinued.  Appears improved.  Advised to stay hydrated and stand slowly  Frequent PACs/SVT: Zio patch x14 days on 07/29/2019 showed very frequent PACs (18.1% of beats) with numerous episodes of SVT, longest lasting 7 beats.  Continue diltiazem.  TSH was low, hypothyroidism may have been contributing to increased ectopy, her levothyroxine dose was decreased.  TSH normal on 06/27/2020.  Zio patch x7 days on 10/07/2020 showed no Afib but 2564 episodes of SVT, longest lasting  15 beats and frequent PACs (10.3% of beats), supraventricular couplets (16.1%), and triplets (6.6%). -Continue diltiazem as above -Frequent PACs on EKG in clinic today, will update Zio patch x 3 days  Chronic diastolic heart failure: Was on Lasix 20 mg 3 times weekly.  Discontinued Lasix due to orthostatic hypotension as above.  Hypertension: Presented with orthostatic hypotension, held diltiazem but restarted for atrial flutter as above.  Appears controlled.  Hyperlipidemia: On atorvastatin 10 mg.  LDL 105 on 12/28/2019, atorvastatin increased to 20 mg daily.  LDL 54 on 03/08/22.  Check lipid panel  Tobacco use: quit smoking in 2022, encouraged continued cessation  Leg pain: ABIs normal 03/2019, no evidence of PAD  COPD: on home oxygen.  Follows with pulmonology.   RTC in 6 months   Medication Adjustments/Labs and Tests Ordered: Current medicines are reviewed at length with the patient today.  Concerns regarding medicines are outlined above.  Orders Placed This Encounter  Procedures   Lipid panel   Comprehensive Metabolic Panel (CMET)   CBC   LONG TERM MONITOR (3-14 DAYS)   EKG 12-Lead   EKG 12-Lead    No orders of the defined types were placed in this encounter.    Patient Instructions  Medication Instructions:  Continue current medications *If you need a refill on your cardiac medications before your next appointment, please call your pharmacy*   Lab Work: Lipid panel, cbc, cmet today If you have labs (blood work) drawn today and your tests are completely normal, you will receive your results only by: MyChart Message (if you have MyChart) OR A paper copy in the mail If you have any lab test that is abnormal or we need to change your treatment, we will call you to review the results.   Testing/Procedures: ZIO ZIO XT- Long Term Monitor Instructions  Your physician has requested you wear a ZIO patch monitor for 3 days.  This  is a single patch monitor. [employer]  supplies one patch monitor per enrollment. Additional stickers are not available. Please do not apply patch if you will be having a Nuclear Stress Test,  Echocardiogram, Cardiac CT, MRI, or Chest Xray during the period you would be wearing the  monitor. The patch cannot be worn during these tests. You cannot remove and re-apply the  ZIO XT patch monitor.  Your ZIO patch monitor will be mailed 3 day USPS to your address on file. It may take 3-5 days  to receive your monitor after you have been enrolled.  Once you have received your monitor, please review the enclosed instructions. Your monitor  has already been registered assigning a specific monitor serial # to you.  Billing and Patient Assistance Program Information  We have supplied [employer] with any of your insurance information on file for billing purposes. [employer] offers a sliding scale Patient Assistance Program for patients that do not have  insurance, or whose insurance does not completely cover the cost of the ZIO monitor.  You must apply for the Patient Assistance Program to qualify for this discounted rate.  To apply, please call [employer] at [phone], select option 4, select option 2, ask to apply for  Patient Assistance Program. Meredeth Ide will ask your household income, and how many people  are in your household. They will quote your out-of-pocket cost based on that information.  [employer] will also be able to set up a 22-month, interest-free payment plan if needed.  Applying the monitor   Shave hair from upper left chest.  Hold abrader disc by orange tab. Rub abrader in 40 strokes over the upper left chest as  indicated in your monitor instructions.  Clean area with 4 enclosed alcohol pads. Let dry.  Apply patch as indicated in monitor instructions. Patch will be placed under collarbone on left  side of chest with arrow pointing upward.  Rub patch adhesive wings for 2 minutes. Remove white label marked "1". Remove the white   label marked "2". Rub patch adhesive wings for 2 additional minutes.  While looking in a mirror, press and release button in center of patch. A small green light will  flash 3-4 times. This will be your only indicator that the monitor has been turned on.  Do not shower for the first 24 hours. You may shower after the first 24 hours.  Press the button if you feel a symptom. You will hear a small click. Record Date, Time and  Symptom in the Patient Logbook.  When you are ready to remove the patch, follow instructions on the last 2 pages of Patient  Logbook. Stick patch monitor onto the last page of Patient Logbook.  Place Patient Logbook in the blue and white box. Use locking tab on box and tape box closed  securely. The blue and white box has prepaid postage on it. Please place it in the mailbox as  soon as possible. Your physician should have your test results approximately 7 days after the  monitor has been mailed back to [facility].  Call [facility] Customer Care at [phone] if you have questions regarding  your ZIO XT patch monitor. Call them immediately if you see an orange light blinking on your  monitor.  If your monitor falls off in less than 4 days, contact our Monitor department at [phone].  If your monitor becomes loose or falls off after 4 days call [employer] at [phone] for  suggestions on securing your monitor  Follow-Up: At [facility], you and your health needs are our priority.  As part of our continuing mission to provide you with exceptional heart care, we have created designated Provider Care Teams.  These Care Teams include your primary Cardiologist (physician) and Advanced Practice Providers (APPs -  Physician Assistants and Nurse Practitioners) who all work together to provide you with the care you need, when you need it.  We recommend signing up for the patient portal called "MyChart".  Sign up information is provided on this After  Visit Summary.  MyChart is used to connect with patients for Virtual Visits (Telemedicine).  Patients are able to view lab/test results, encounter notes, upcoming appointments, etc.  Non-urgent messages can be sent to your provider as well.   To learn more about what you can do with MyChart, go to [email or web address].    Your next appointment:   6 month(s)  Provider:   Dr. Bjorn Pippin  Other Instructions none          Signed, Little Ishikawa, MD  08/15/2023 12:28 PM    [facility]

## 2023-08-15 ENCOUNTER — Ambulatory Visit (INDEPENDENT_AMBULATORY_CARE_PROVIDER_SITE_OTHER): Payer: Medicare HMO

## 2023-08-15 ENCOUNTER — Encounter: Payer: Self-pay | Admitting: Cardiology

## 2023-08-15 ENCOUNTER — Ambulatory Visit: Payer: Medicare HMO | Attending: Cardiology | Admitting: Cardiology

## 2023-08-15 VITALS — BP 124/70 | HR 83 | Ht 66.0 in | Wt 182.2 lb

## 2023-08-15 DIAGNOSIS — I491 Atrial premature depolarization: Secondary | ICD-10-CM | POA: Diagnosis not present

## 2023-08-15 DIAGNOSIS — I4891 Unspecified atrial fibrillation: Secondary | ICD-10-CM

## 2023-08-15 DIAGNOSIS — E785 Hyperlipidemia, unspecified: Secondary | ICD-10-CM | POA: Diagnosis not present

## 2023-08-15 DIAGNOSIS — I480 Paroxysmal atrial fibrillation: Secondary | ICD-10-CM | POA: Diagnosis not present

## 2023-08-15 DIAGNOSIS — I10 Essential (primary) hypertension: Secondary | ICD-10-CM | POA: Diagnosis not present

## 2023-08-15 NOTE — Progress Notes (Unsigned)
 Enrolled patient for a 3 day Zio XT monitor to be mailed to patients home

## 2023-08-15 NOTE — Patient Instructions (Addendum)
 Medication Instructions:  Continue current medications *If you need a refill on your cardiac medications before your next appointment, please call your pharmacy*   Lab Work: Lipid panel, cbc, cmet today If you have labs (blood work) drawn today and your tests are completely normal, you will receive your results only by: MyChart Message (if you have MyChart) OR A paper copy in the mail If you have any lab test that is abnormal or we need to change your treatment, we will call you to review the results.   Testing/Procedures: ZIO ZIO XT- Long Term Monitor Instructions  Your physician has requested you wear a ZIO patch monitor for 3 days.  This is a single patch monitor. [employer] supplies one patch monitor per enrollment. Additional stickers are not available. Please do not apply patch if you will be having a Nuclear Stress Test,  Echocardiogram, Cardiac CT, MRI, or Chest Xray during the period you would be wearing the  monitor. The patch cannot be worn during these tests. You cannot remove and re-apply the  ZIO XT patch monitor.  Your ZIO patch monitor will be mailed 3 day USPS to your address on file. It may take 3-5 days  to receive your monitor after you have been enrolled.  Once you have received your monitor, please review the enclosed instructions. Your monitor  has already been registered assigning a specific monitor serial # to you.  Billing and Patient Assistance Program Information  We have supplied [employer] with any of your insurance information on file for billing purposes. [employer] offers a sliding scale Patient Assistance Program for patients that do not have  insurance, or whose insurance does not completely cover the cost of the ZIO monitor.  You must apply for the Patient Assistance Program to qualify for this discounted rate.  To apply, please call [employer] at [phone], select option 4, select option 2, ask to apply for  Patient Assistance Program. Meredeth Ide will ask  your household income, and how many people  are in your household. They will quote your out-of-pocket cost based on that information.  [employer] will also be able to set up a 60-month, interest-free payment plan if needed.  Applying the monitor   Shave hair from upper left chest.  Hold abrader disc by orange tab. Rub abrader in 40 strokes over the upper left chest as  indicated in your monitor instructions.  Clean area with 4 enclosed alcohol pads. Let dry.  Apply patch as indicated in monitor instructions. Patch will be placed under collarbone on left  side of chest with arrow pointing upward.  Rub patch adhesive wings for 2 minutes. Remove white label marked "1". Remove the white  label marked "2". Rub patch adhesive wings for 2 additional minutes.  While looking in a mirror, press and release button in center of patch. A small green light will  flash 3-4 times. This will be your only indicator that the monitor has been turned on.  Do not shower for the first 24 hours. You may shower after the first 24 hours.  Press the button if you feel a symptom. You will hear a small click. Record Date, Time and  Symptom in the Patient Logbook.  When you are ready to remove the patch, follow instructions on the last 2 pages of Patient  Logbook. Stick patch monitor onto the last page of Patient Logbook.  Place Patient Logbook in the blue and white box. Use locking tab on box and tape box closed  securely.  The blue and white box has prepaid postage on it. Please place it in the mailbox as  soon as possible. Your physician should have your test results approximately 7 days after the  monitor has been mailed back to [facility].  Call [facility] Customer Care at [phone] if you have questions regarding  your ZIO XT patch monitor. Call them immediately if you see an orange light blinking on your  monitor.  If your monitor falls off in less than 4 days, contact our Monitor department at  [phone].  If your monitor becomes loose or falls off after 4 days call [employer] at [phone] for  suggestions on securing your monitor    Follow-Up: At [facility], you and your health needs are our priority.  As part of our continuing mission to provide you with exceptional heart care, we have created designated Provider Care Teams.  These Care Teams include your primary Cardiologist (physician) and Advanced Practice Providers (APPs -  Physician Assistants and Nurse Practitioners) who all work together to provide you with the care you need, when you need it.  We recommend signing up for the patient portal called "MyChart".  Sign up information is provided on this After Visit Summary.  MyChart is used to connect with patients for Virtual Visits (Telemedicine).  Patients are able to view lab/test results, encounter notes, upcoming appointments, etc.  Non-urgent messages can be sent to your provider as well.   To learn more about what you can do with MyChart, go to [email or web address].    Your next appointment:   6 month(s)  Provider:   Dr. Bjorn Pippin  Other Instructions none

## 2023-08-16 LAB — CBC
Hematocrit: 38.4 % (ref 34.0–46.6)
Hemoglobin: 12.6 g/dL (ref 11.1–15.9)
MCH: 29.4 pg (ref 26.6–33.0)
MCHC: 32.8 g/dL (ref 31.5–35.7)
MCV: 90 fL (ref 79–97)
Platelets: 355 10*3/uL (ref 150–450)
RBC: 4.28 x10E6/uL (ref 3.77–5.28)
RDW: 13.4 % (ref 11.7–15.4)
WBC: 10.8 10*3/uL (ref 3.4–10.8)

## 2023-08-16 LAB — COMPREHENSIVE METABOLIC PANEL
ALT: 30 IU/L (ref 0–32)
AST: 27 IU/L (ref 0–40)
Albumin: 4 g/dL (ref 3.7–4.7)
Alkaline Phosphatase: 124 IU/L — ABNORMAL HIGH (ref 44–121)
BUN/Creatinine Ratio: 34 — ABNORMAL HIGH (ref 12–28)
BUN: 35 mg/dL — ABNORMAL HIGH (ref 8–27)
Bilirubin Total: 0.4 mg/dL (ref 0.0–1.2)
CO2: 23 mmol/L (ref 20–29)
Calcium: 9.6 mg/dL (ref 8.7–10.3)
Chloride: 101 mmol/L (ref 96–106)
Creatinine, Ser: 1.04 mg/dL — ABNORMAL HIGH (ref 0.57–1.00)
Globulin, Total: 2.5 g/dL (ref 1.5–4.5)
Glucose: 86 mg/dL (ref 70–99)
Potassium: 4.9 mmol/L (ref 3.5–5.2)
Sodium: 139 mmol/L (ref 134–144)
Total Protein: 6.5 g/dL (ref 6.0–8.5)
eGFR: 52 mL/min/{1.73_m2} — ABNORMAL LOW (ref >59)

## 2023-08-16 LAB — LIPID PANEL
Chol/HDL Ratio: 1.8 ratio (ref 0.0–4.4)
Cholesterol, Total: 152 mg/dL (ref 100–199)
HDL: 83 mg/dL (ref >39)
LDL Chol Calc (NIH): 57 mg/dL (ref 0–99)
Triglycerides: 58 mg/dL (ref 0–149)
VLDL Cholesterol Cal: 12 mg/dL (ref 5–40)

## 2023-08-21 ENCOUNTER — Telehealth: Payer: Self-pay | Admitting: Cardiology

## 2023-08-21 DIAGNOSIS — I4891 Unspecified atrial fibrillation: Secondary | ICD-10-CM

## 2023-08-21 NOTE — Telephone Encounter (Signed)
 Pt c/o medication issue:  1. Name of Medication: Antihistamine OTC   2. How are you currently taking this medication (dosage and times per day)?   3. Are you having a reaction (difficulty breathing--STAT)?   4. What is your medication issue?   Patient is requesting call back to ask question to see if it will be okay to take antihistamine OTC. Please advice.

## 2023-08-21 NOTE — Telephone Encounter (Signed)
 She can take either one, but not both at the same time.

## 2023-08-21 NOTE — Telephone Encounter (Signed)
 Patient identification verified by 2 forms. Tiffany Rail, RN    Called and spoke to patient  Patient states:   -would like to know if okay to take OTC   -currently having allergy symptoms   -Has Allegra and Zyrtec   -can leave a detailed voicemail if she does not answer call  Informed patient message sent to pharmacy for assistance Patient verbalized understanding, no questions at this time

## 2023-08-21 NOTE — Telephone Encounter (Signed)
 Called pt to relay the message. She verbalized understanding, no further questions at the time. PT request to speak with the manager to tell how great the office treats her. Will get her information to the manager per her request.

## 2023-08-22 ENCOUNTER — Other Ambulatory Visit (HOSPITAL_COMMUNITY): Payer: Self-pay

## 2023-08-27 ENCOUNTER — Ambulatory Visit: Payer: Medicare Other | Admitting: Cardiology

## 2023-08-30 ENCOUNTER — Ambulatory Visit: Payer: Medicare HMO

## 2023-08-30 DIAGNOSIS — Z0000 Encounter for general adult medical examination without abnormal findings: Secondary | ICD-10-CM

## 2023-08-30 NOTE — Patient Instructions (Signed)
 Tiffany Jennings , Thank you for taking time to come for your Medicare Wellness Visit. I appreciate your ongoing commitment to your health goals. Please review the following plan we discussed and let me know if I can assist you in the future.   Referrals/Orders/Follow-Ups/Clinician Recommendations: none  This is a list of the screening recommended for you and due dates:  Health Maintenance  Topic Date Due   DTaP/Tdap/Td vaccine (1 - Tdap) Never done   Zoster (Shingles) Vaccine (1 of 2) Never done   Medicare Annual Wellness Visit  08/29/2024   Pneumonia Vaccine  Completed   Flu Shot  Completed   DEXA scan (bone density measurement)  Completed   HPV Vaccine  Aged Out   COVID-19 Vaccine  Discontinued    Advanced directives: (Copy Requested) Please bring a copy of your health care power of attorney and living will to the office to be added to your chart at your convenience. You can mail to [facility] [address] [place], [place] [address] or email to [email or web address]  Next Medicare Annual Wellness Visit scheduled for next year: Yes  insert Preventive Care attachment Insert FALL PREVENTION attachment if needed

## 2023-08-30 NOTE — Progress Notes (Signed)
 Subjective:   Tiffany Jennings is a 89 y.o. who presents for a Medicare Wellness preventive visit.  Visit Complete: Virtual I connected with  Tiffany Jennings on 08/30/23 by a video and audio enabled telemedicine application and verified that I am speaking with the correct person using two identifiers.  Patient Location: Home  Provider Location: Office/Clinic  I discussed the limitations of evaluation and management by telemedicine. The patient expressed understanding and agreed to proceed.  Vital Signs: Because this visit was a virtual/telehealth visit, some criteria may be missing or patient reported. Any vitals not documented were not able to be obtained and vitals that have been documented are patient reported.    Persons Participating in Visit: n/a  AWV Questionnaire: No: Patient Medicare AWV questionnaire was not completed prior to this visit.  Cardiac Risk Factors include: advanced age (>71men, >45 women);hypertension     Objective:    Today's Vitals   There is no height or weight on file to calculate BMI.     08/30/2023   10:57 AM 07/06/2022   11:23 AM 10/04/2020    1:41 PM 09/30/2019   11:04 AM 07/06/2019   11:37 AM  Advanced Directives  Does Patient Have a Medical Advance Directive? Yes Yes Yes Yes Yes  Type of Estate agent of [place];Living will Healthcare Power of [place];Living will Healthcare Power of [place];Living will Healthcare Power of [employer] of [place];Living will  Does patient want to make changes to medical advance directive?    No - Patient declined   Copy of Healthcare Power of Attorney in Chart? No - copy requested No - copy requested No - copy requested No - copy requested No - copy requested    Current Medications (verified) Outpatient Encounter Medications as of 08/30/2023  Medication Sig   albuterol (VENTOLIN HFA) 108 (90 Base) MCG/ACT inhaler INHALE 2 PUFFS INTO THE LUNGS EVERY 6 HOURS AS NEEDED FOR  WHEEZING OR SHORTNESS OF BREATH   apixaban (ELIQUIS) 5 MG TABS tablet Take 1 tablet (5 mg total) by mouth 2 (two) times daily.   atorvastatin (LIPITOR) 20 MG tablet TAKE ONE TABLET BY MOUTH DAILY   carboxymethylcellulose (REFRESH PLUS) 0.5 % SOLN 1 drop 3 (three) times daily as needed.   diltiazem (CARDIZEM CD) 120 MG 24 hr capsule TAKE 1 CAPSULE BY MOUTH DAILY   donepezil (ARICEPT) 10 MG tablet TAKE 1 TABLET BY MOUTH EVERY NIGHT AT BEDTIME   imipramine (TOFRANIL) 50 MG tablet TAKE 1 TO 2 TABLETS BY MOUTH EVERY NIGHT AT BEDTIME   levothyroxine (SYNTHROID) 50 MCG tablet TAKE 1 TABLET BY MOUTH DAILY BEFORE BREAKFAST   TRELEGY ELLIPTA 100-62.5-25 MCG/ACT AEPB INHALE 1 PUFF BY MOUTH DAILY   No facility-administered encounter medications on file as of 08/30/2023.    Allergies (verified) Codeine   History: Past Medical History:  Diagnosis Date   A-fib (HCC)    COPD (chronic obstructive pulmonary disease) (HCC)    Hyperlipidemia    Hypertension    Past Surgical History:  Procedure Laterality Date   CAROTID ANGIOGRAM     Family History  Problem Relation Age of Onset   Breast cancer Daughter 53   Breast cancer Maternal Grandmother 16   BRCA 1/2 Neg Hx    Social History   Socioeconomic History   Marital status: Single    Spouse name: Not on file   Number of children: Not on file   Years of education: Not on file   Highest education  level: Associate degree: occupational, Scientist, product/process development, or vocational program  Occupational History   Not on file  Tobacco Use   Smoking status: Former    Current packs/day: 0.00    Average packs/day: 0.3 packs/day for 50.0 years (12.5 ttl pk-yrs)    Types: Cigarettes    Start date: 04/19/1971    Quit date: 04/18/2021    Years since quitting: 2.3   Smokeless tobacco: Never  Vaping Use   Vaping status: Never Used  Substance and Sexual Activity   Alcohol use: Not Currently   Drug use: Never   Sexual activity: Not Currently    Partners: Male     Comment: DIVORCED  Other Topics Concern   Not on file  Social History Narrative   Not on file   Social Drivers of Health   Financial Resource Strain: Low Risk  (08/30/2023)   Overall Financial Resource Strain (CARDIA)    Difficulty of Paying Living Expenses: Not hard at all  Food Insecurity: No Food Insecurity (08/30/2023)   Hunger Vital Sign    Worried About Running Out of Food in the Last Year: Never true    Ran Out of Food in the Last Year: Never true  Transportation Needs: No Transportation Needs (08/30/2023)   PRAPARE - Administrator, Civil Service (Medical): No    Lack of Transportation (Non-Medical): No  Physical Activity: Sufficiently Active (08/30/2023)   Exercise Vital Sign    Days of Exercise per Week: 5 days    Minutes of Exercise per Session: 120 min  Stress: No Stress Concern Present (08/30/2023)   [employer] of Occupational Health - Occupational Stress Questionnaire    Feeling of Stress : Not at all  Social Connections: Moderately Isolated (08/30/2023)   Social Connection and Isolation Panel [NHANES]    Frequency of Communication with Friends and Family: More than three times a week    Frequency of Social Gatherings with Friends and Family: More than three times a week    Attends Religious Services: Never    Database administrator or Organizations: Yes    Attends Engineer, structural: More than 4 times per year    Marital Status: Divorced    Tobacco Counseling Counseling given: Not Answered    Clinical Intake:  Pre-visit preparation completed: Yes  Pain : No/denies pain     Nutritional Risks: None Diabetes: No  How often do you need to have someone help you when you read instructions, pamphlets, or other written materials from your doctor or pharmacy?: 1 - Never  Interpreter Needed?: No  Information entered by :: NAllen LPN   Activities of Daily Living     08/30/2023   10:51 AM  In your present state of health, do you  have any difficulty performing the following activities:  Hearing? 1  Comment wears hearing aids  Vision? 0  Difficulty concentrating or making decisions? 0  Walking or climbing stairs? 0  Dressing or bathing? 0  Doing errands, shopping? 0  Preparing Food and eating ? N  Using the Toilet? N  In the past six months, have you accidently leaked urine? N  Do you have problems with loss of bowel control? N  Managing your Medications? N  Managing your Finances? N  Housekeeping or managing your Housekeeping? N    Patient Care Team: Mliss Sax, MD as PCP - General (Family Medicine)  Indicate any recent Medical Services you may have received from other than [facility] providers in the  past year (date may be approximate).     Assessment:   This is a routine wellness examination for [place].  Hearing/Vision screen Hearing Screening - Comments:: Has hearing aids that are maintained Vision Screening - Comments:: Regular eye exams, [facility]   Goals Addressed             This Visit's Progress    Patient Stated       08/30/2023, wants to lose 40-50 pounds       Depression Screen     08/30/2023   10:59 AM 07/06/2022   11:23 AM 03/08/2022   12:04 PM 08/04/2021    1:14 PM 05/26/2021    2:28 PM 02/06/2021    1:39 PM 02/06/2021   11:04 AM  PHQ 2/9 Scores  PHQ - 2 Score 0 0 0 0 0 0 0  PHQ- 9 Score 0     1     Fall Risk     08/30/2023   10:58 AM 07/06/2022   11:23 AM 08/04/2021    1:14 PM 05/26/2021    2:28 PM 02/06/2021   11:05 AM  Fall Risk   Falls in the past year? 0 0 0 0 0  Number falls in past yr: 0 0 0 0   Injury with Fall? 0 0     Risk for fall due to : Medication side effect Medication side effect     Follow up Falls prevention discussed;Falls evaluation completed Falls prevention discussed;Education provided;Falls evaluation completed       MEDICARE RISK AT HOME:  Medicare Risk at Home Any stairs in or around the home?: Yes If so, are there any without  handrails?: No Home free of loose throw rugs in walkways, pet beds, electrical cords, etc?: Yes Adequate lighting in your home to reduce risk of falls?: Yes Life alert?: No Use of a cane, walker or w/c?: No Grab bars in the bathroom?: Yes Shower chair or bench in shower?: Yes Elevated toilet seat or a handicapped toilet?: Yes  TIMED UP AND GO:  Was the test performed?  No  Cognitive Function: 6CIT completed        08/30/2023   11:00 AM 07/06/2022   11:24 AM  6CIT Screen  What Year? 0 points 0 points  What month? 0 points 0 points  What time? 0 points 0 points  Count back from 20 0 points 0 points  Months in reverse 0 points 0 points  Repeat phrase 0 points 2 points  Total Score 0 points 2 points    Immunizations Immunization History  Administered Date(s) Administered   Fluad Quad(high Dose 65+) 03/06/2019, 03/29/2020, 02/23/2023   Influenza Whole 02/18/2018   Influenza, High Dose Seasonal PF 05/26/2021, 03/08/2022   PFIZER(Purple Top)SARS-COV-2 Vaccination 08/16/2019, 09/15/2019, 03/01/2020, 06/20/2021   PNEUMOCOCCAL CONJUGATE-20 03/08/2022   Pfizer(Comirnaty)Fall Seasonal Vaccine 12 years and older 02/23/2023   Pneumococcal Conjugate-13 06/24/2018    Screening Tests Health Maintenance  Topic Date Due   DTaP/Tdap/Td (1 - Tdap) Never done   Zoster Vaccines- Shingrix (1 of 2) Never done   Medicare Annual Wellness (AWV)  08/29/2024   Pneumonia Vaccine 67+ Years old  Completed   INFLUENZA VACCINE  Completed   DEXA SCAN  Completed   HPV VACCINES  Aged Out   COVID-19 Vaccine  Discontinued    Health Maintenance  Health Maintenance Due  Topic Date Due   DTaP/Tdap/Td (1 - Tdap) Never done   Zoster Vaccines- Shingrix (1 of 2) Never done  Health Maintenance Items Addressed: Due for TDAP. Declines Shingrix at this time.  Additional Screening:  Vision Screening: Recommended annual ophthalmology exams for early detection of glaucoma and other disorders of the  eye.  Dental Screening: Recommended annual dental exams for proper oral hygiene  Community Resource Referral / Chronic Care Management: CRR required this visit?  No   CCM required this visit?  No     Plan:     I have personally reviewed and noted the following in the patient's chart:   Medical and social history Use of alcohol, tobacco or illicit drugs  Current medications and supplements including opioid prescriptions. Patient is not currently taking opioid prescriptions. Functional ability and status Nutritional status Physical activity Advanced directives List of other physicians Hospitalizations, surgeries, and ER visits in previous 12 months Vitals Screenings to include cognitive, depression, and falls Referrals and appointments  In addition, I have reviewed and discussed with patient certain preventive protocols, quality metrics, and best practice recommendations. A written personalized care plan for preventive services as well as general preventive health recommendations were provided to patient.     Barb Merino, LPN   09/17/270   After Visit Summary: (Pick Up) Due to this being a telephonic visit, with patients personalized plan was offered to patient and patient has requested to Pick up at office.  Notes: Nothing significant to report at this time.

## 2023-08-30 NOTE — Telephone Encounter (Signed)
 Spoke with patient to follow-up. Patient again expressed her appreciation for the [facility] team.   Pt also asked about a billing issue. She received a bill for $126.67 and paid $40.00 towards it on 08/14/23 (confirmation # [id number]). This charge has appeared on her credit card, but has not yet appeared on her billing statement from [facility]. Advised pt that I will follow-up with her next week after reviewing with the Revenue Cycle Manager at [facility]. Pt verbalizes understanding and appreciation of assistance.

## 2023-09-02 DIAGNOSIS — H16223 Keratoconjunctivitis sicca, not specified as Sjogren's, bilateral: Secondary | ICD-10-CM | POA: Diagnosis not present

## 2023-09-02 DIAGNOSIS — Z961 Presence of intraocular lens: Secondary | ICD-10-CM | POA: Diagnosis not present

## 2023-09-02 DIAGNOSIS — H18453 Nodular corneal degeneration, bilateral: Secondary | ICD-10-CM | POA: Diagnosis not present

## 2023-09-05 DIAGNOSIS — I4891 Unspecified atrial fibrillation: Secondary | ICD-10-CM | POA: Diagnosis not present

## 2023-09-06 NOTE — Telephone Encounter (Signed)
 Spoke with patient regarding billing concern. Per Arne Cleveland, Billing Manager, patient has [phone] remaining on a hospital bill from 07/16/23. She also has an account balance of $15.00 (copay) from her 08/15/23 appointment with Dr. Bjorn Pippin. Provided Patient Accounting number for payment. Pt expressed appreciation of return call and information. She denies additional concerns at this time.

## 2023-09-09 ENCOUNTER — Ambulatory Visit (INDEPENDENT_AMBULATORY_CARE_PROVIDER_SITE_OTHER): Admitting: Family Medicine

## 2023-09-09 ENCOUNTER — Encounter: Payer: Self-pay | Admitting: Family Medicine

## 2023-09-09 VITALS — BP 110/72 | HR 84 | Temp 98.5°F | Ht 66.0 in | Wt 177.6 lb

## 2023-09-09 DIAGNOSIS — E039 Hypothyroidism, unspecified: Secondary | ICD-10-CM | POA: Diagnosis not present

## 2023-09-09 DIAGNOSIS — Z0000 Encounter for general adult medical examination without abnormal findings: Secondary | ICD-10-CM | POA: Diagnosis not present

## 2023-09-09 DIAGNOSIS — E7800 Pure hypercholesterolemia, unspecified: Secondary | ICD-10-CM | POA: Diagnosis not present

## 2023-09-09 DIAGNOSIS — R8290 Unspecified abnormal findings in urine: Secondary | ICD-10-CM | POA: Diagnosis not present

## 2023-09-09 LAB — URINALYSIS, ROUTINE W REFLEX MICROSCOPIC
Bilirubin Urine: NEGATIVE
Hgb urine dipstick: NEGATIVE
Ketones, ur: NEGATIVE
Nitrite: POSITIVE — AB
RBC / HPF: NONE SEEN (ref 0–?)
Specific Gravity, Urine: 1.01 (ref 1.000–1.030)
Total Protein, Urine: NEGATIVE
Urine Glucose: NEGATIVE
Urobilinogen, UA: 0.2 (ref 0.0–1.0)
pH: 6.5 (ref 5.0–8.0)

## 2023-09-09 LAB — TSH: TSH: 4.36 u[IU]/mL (ref 0.35–5.50)

## 2023-09-09 NOTE — Progress Notes (Signed)
 Established Patient Office Visit   Subjective:  Patient ID: Tiffany Jennings, female    DOB: September 08, 1934  Age: 89 y.o. MRN: [id number]  No chief complaint on file.   HPI Encounter Diagnoses  Name Primary?   Hypothyroidism, unspecified type Yes   Elevated cholesterol    Healthcare maintenance    For follow-up of above.  Continues levothyroxine 50 mcg for hypothyroidism.  Continues atorvastatin 20 for elevated cholesterol.  Kidney function has been stable in the stage IIIa range.  She is hydrating well.  Continues to go to the gym 4-5 times weekly and enjoys it.  She does classes and works out on [employer].   Review of Systems  Constitutional: Negative.   HENT: Negative.    Eyes:  Negative for blurred vision, discharge and redness.  Respiratory: Negative.    Cardiovascular: Negative.   Gastrointestinal:  Negative for abdominal pain.  Genitourinary: Negative.   Musculoskeletal: Negative.  Negative for myalgias.  Skin:  Negative for rash.  Neurological:  Negative for tingling, loss of consciousness and weakness.  Endo/Heme/Allergies:  Negative for polydipsia.     Current Outpatient Medications:    albuterol (VENTOLIN HFA) 108 (90 Base) MCG/ACT inhaler, INHALE 2 PUFFS INTO THE LUNGS EVERY 6 HOURS AS NEEDED FOR WHEEZING OR SHORTNESS OF BREATH, Disp: 18 g, Rfl: 1   apixaban (ELIQUIS) 5 MG TABS tablet, Take 1 tablet (5 mg total) by mouth 2 (two) times daily., Disp: 60 tablet, Rfl: 3   atorvastatin (LIPITOR) 20 MG tablet, TAKE ONE TABLET BY MOUTH DAILY, Disp: 90 tablet, Rfl: 2   carboxymethylcellulose (REFRESH PLUS) 0.5 % SOLN, 1 drop 3 (three) times daily as needed., Disp: , Rfl:    cetirizine (ZYRTEC) 5 MG chewable tablet, Chew 5 mg by mouth daily., Disp: , Rfl:    diltiazem (CARDIZEM CD) 120 MG 24 hr capsule, TAKE 1 CAPSULE BY MOUTH DAILY, Disp: 90 capsule, Rfl: 3   donepezil (ARICEPT) 10 MG tablet, TAKE 1 TABLET BY MOUTH EVERY NIGHT AT BEDTIME, Disp: 90 tablet, Rfl: 1    imipramine (TOFRANIL) 50 MG tablet, TAKE 1 TO 2 TABLETS BY MOUTH EVERY NIGHT AT BEDTIME, Disp: 180 tablet, Rfl: 3   levothyroxine (SYNTHROID) 50 MCG tablet, TAKE 1 TABLET BY MOUTH DAILY BEFORE BREAKFAST, Disp: 90 tablet, Rfl: 4   TRELEGY ELLIPTA 100-62.5-25 MCG/ACT AEPB, INHALE 1 PUFF BY MOUTH DAILY, Disp: 60 each, Rfl: 5   Objective:     BP 110/72 (BP Location: Left Arm, Patient Position: Sitting)   Pulse 84   Temp 98.5 F (36.9 C) (Temporal)   Ht 5\' 6"  (1.676 m)   Wt 177 lb 9.6 oz (80.6 kg)   SpO2 96%   BMI 28.67 kg/m    Physical Exam Constitutional:      General: She is not in acute distress.    Appearance: Normal appearance. She is not ill-appearing, toxic-appearing or diaphoretic.  HENT:     Head: Normocephalic and atraumatic.     Right Ear: External ear normal.     Left Ear: External ear normal.     Mouth/Throat:     Mouth: Mucous membranes are moist.     Pharynx: Oropharynx is clear. No oropharyngeal exudate or posterior oropharyngeal erythema.  Eyes:     General: No scleral icterus.       Right eye: No discharge.        Left eye: No discharge.     Extraocular Movements: Extraocular movements intact.     Conjunctiva/sclera:  Conjunctivae normal.     Pupils: Pupils are equal, round, and reactive to light.  Cardiovascular:     Rate and Rhythm: Normal rate. Rhythm irregularly irregular.  Pulmonary:     Effort: Pulmonary effort is normal. No respiratory distress.     Breath sounds: Normal breath sounds. Decreased air movement present.  Abdominal:     General: Bowel sounds are normal.     Tenderness: There is no abdominal tenderness. There is no guarding.  Musculoskeletal:     Cervical back: No rigidity or tenderness.  Skin:    General: Skin is warm and dry.  Neurological:     Mental Status: She is alert and oriented to person, place, and time.  Psychiatric:        Mood and Affect: Mood normal.        Behavior: Behavior normal.      No results found for any  visits on 09/09/23.    The ASCVD Risk score (Arnett DK, et al., 2019) failed to calculate for the following reasons:   The 2019 ASCVD risk score is only valid for ages 32 to 90    Assessment & Plan:   Hypothyroidism, unspecified type -     TSH  Elevated cholesterol  Healthcare maintenance -     Urinalysis, Routine w reflex microscopic    Return in about 6 months (around 03/11/2024), or if symptoms worsen or fail to improve.  Advised patient to continue medications.  Advised her to have her Tdap and Shingrix vaccines at the pharmacy.  Mliss Sax, MD

## 2023-09-10 NOTE — Addendum Note (Signed)
 Addended by: Andrez Grime on: 09/10/2023 09:58 AM   Modules accepted: Orders

## 2023-09-12 ENCOUNTER — Other Ambulatory Visit: Payer: Self-pay

## 2023-09-12 DIAGNOSIS — R0902 Hypoxemia: Secondary | ICD-10-CM | POA: Diagnosis not present

## 2023-09-12 DIAGNOSIS — G4730 Sleep apnea, unspecified: Secondary | ICD-10-CM | POA: Diagnosis not present

## 2023-09-16 ENCOUNTER — Other Ambulatory Visit: Payer: Self-pay | Admitting: *Deleted

## 2023-09-16 ENCOUNTER — Encounter: Payer: Self-pay | Admitting: *Deleted

## 2023-09-16 ENCOUNTER — Telehealth: Payer: Self-pay

## 2023-09-16 MED ORDER — DILTIAZEM HCL ER COATED BEADS 180 MG PO CP24: 180.0000 mg | ORAL_CAPSULE | Freq: Every day | ORAL | 3 refills | Status: DC

## 2023-09-16 NOTE — Telephone Encounter (Signed)
 Copied from CRM [id number]. Topic: Clinical - Lab/Test Results >> Sep 12, 2023  3:59 PM Tiffany Jennings wrote: Reason for CRM: Patient would like the results for the recent test she had this morning, states it checked her pulse and oxygen rate.  Callback number: [phone]  ONO results have been placed in Dr. Trena Platt cabinet. Please advise

## 2023-09-16 NOTE — Progress Notes (Unsigned)
 Patient called and made aware of results and made aware to start Diltiazem 180 mg daily per Dr. Bjorn Pippin. Patient verbalized an understanding,

## 2023-09-17 ENCOUNTER — Telehealth: Payer: Self-pay | Admitting: Cardiology

## 2023-09-17 NOTE — Telephone Encounter (Signed)
 Yes patient is fine to continue her current activity level

## 2023-09-17 NOTE — Telephone Encounter (Signed)
 Patient calling in to see with the increase of medication. Can she still go to the gum. Please advise

## 2023-09-17 NOTE — Telephone Encounter (Signed)
 Patient identification verified by 2 forms. Tiffany Rail, RN    Called and spoke to patients daughter Rowland Lathe states:   -patient is confused about message given   -would like to clarify medication change   -would like results regarding monitor  Informed Verlon Au:   -Yesterday diltiazem was increased to 180mg  daily   -Diltiazem increased due to patient having extra beats and fast HR from top of chamber   -Rx for diltiazem sent to [facility] pharmacy yesterday   -this RN spoke to patient this morning regarding activity concern  -Message sent to Dr. Bjorn Pippin regarding activity level, no updates at this time  [place] requests her number be listed as primary for future calls/results  Informed Verlon Au nursing will outreach once message received from Dr. Greg Cutter verbalized understanding, no questions at this time

## 2023-09-17 NOTE — Telephone Encounter (Signed)
 Follow Up:      Patient's daughter called back. She said she needs to talk to the nurse please.She said patient did not understand, what she was told earlier.

## 2023-09-17 NOTE — Telephone Encounter (Signed)
 Patient identification verified by 2 forms. Tiffany Rail, RN    Called and spoke to patient  Patient states:   -diltiazem was increased to 180mg    -would like to know if she needs to adjust her activity level  -she does low impact activity twice a week and cardio 3x a week  Informed patient message sent to Dr. Bjorn Pippin

## 2023-09-18 NOTE — Telephone Encounter (Signed)
 Called and spoke to [place], [employer] party that per Dr. Bjorn Pippin it's fine for patient to continue her current activity level. Understanding verbalized.

## 2023-09-23 ENCOUNTER — Encounter: Payer: Self-pay | Admitting: Pulmonary Disease

## 2023-09-23 ENCOUNTER — Telehealth: Payer: Self-pay | Admitting: Pulmonary Disease

## 2023-09-23 NOTE — Telephone Encounter (Signed)
 Inform patient   Reviewed overnight oximetry Did not show significant desaturation-only below 88% for 2 minutes 52 seconds  Does not need oxygen added to system  Study performed 09/12/2023  Study was done on room air with BiPAP in place

## 2023-09-24 NOTE — Telephone Encounter (Signed)
 Spoke with patient's daughter Verlon Au regarding ONO result's    Reviewed overnight oximetry Did not show significant desaturation-only below 88% for 2 minutes 52 seconds   Does not need oxygen added to system   Study performed 09/12/2023   Study was done on room air with BiPAP in place  [place] stated she is unsure if patient was on room air or on her BIPAP machine at the time she started her ONO test . Patient has been using BIPAP machine with oxygen  in place every night so we are unsure if this test has been done on room air.  Rhetta Mura I will contact her back one I spoke with Dr.Olalere.   Dr.Olalere can you please advise .

## 2023-09-28 ENCOUNTER — Other Ambulatory Visit: Payer: Self-pay | Admitting: Cardiology

## 2023-09-30 ENCOUNTER — Encounter: Payer: Self-pay | Admitting: Pulmonary Disease

## 2023-10-20 ENCOUNTER — Other Ambulatory Visit: Payer: Self-pay | Admitting: Family Medicine

## 2023-10-20 DIAGNOSIS — F339 Major depressive disorder, recurrent, unspecified: Secondary | ICD-10-CM

## 2023-10-31 DIAGNOSIS — M545 Low back pain, unspecified: Secondary | ICD-10-CM | POA: Diagnosis not present

## 2023-11-06 DIAGNOSIS — Z713 Dietary counseling and surveillance: Secondary | ICD-10-CM | POA: Diagnosis not present

## 2023-11-06 DIAGNOSIS — Z8249 Family history of ischemic heart disease and other diseases of the circulatory system: Secondary | ICD-10-CM | POA: Diagnosis not present

## 2023-11-06 DIAGNOSIS — Z833 Family history of diabetes mellitus: Secondary | ICD-10-CM | POA: Diagnosis not present

## 2023-11-11 ENCOUNTER — Other Ambulatory Visit: Payer: Self-pay | Admitting: Family Medicine

## 2023-11-11 DIAGNOSIS — I480 Paroxysmal atrial fibrillation: Secondary | ICD-10-CM

## 2023-12-04 ENCOUNTER — Telehealth: Payer: Self-pay | Admitting: Pulmonary Disease

## 2023-12-04 NOTE — Telephone Encounter (Signed)
 Rc'd fax'd order from [facility] for O2 concentrator. Will fwd to Dr. Deanna Expose for signature.

## 2023-12-11 NOTE — Telephone Encounter (Signed)
 Ordered through [employer], sent back to [facility] to contact [employer] for information.

## 2023-12-24 ENCOUNTER — Other Ambulatory Visit: Payer: Self-pay

## 2023-12-24 DIAGNOSIS — I480 Paroxysmal atrial fibrillation: Secondary | ICD-10-CM

## 2023-12-24 DIAGNOSIS — E039 Hypothyroidism, unspecified: Secondary | ICD-10-CM

## 2023-12-24 MED ORDER — LEVOTHYROXINE SODIUM 50 MCG PO TABS: 50.0000 ug | ORAL_TABLET | Freq: Every day | ORAL | 4 refills | Status: AC

## 2023-12-24 MED ORDER — APIXABAN 5 MG PO TABS: 5.0000 mg | ORAL_TABLET | Freq: Two times a day (BID) | ORAL | 3 refills | Status: DC

## 2023-12-25 ENCOUNTER — Other Ambulatory Visit: Payer: Self-pay

## 2023-12-25 ENCOUNTER — Telehealth: Payer: Self-pay

## 2023-12-25 MED ORDER — DILTIAZEM HCL ER COATED BEADS 180 MG PO CP24: 180.0000 mg | ORAL_CAPSULE | Freq: Every day | ORAL | 2 refills | Status: AC

## 2023-12-25 MED ORDER — TRELEGY ELLIPTA 100-62.5-25 MCG/ACT IN AEPB: 1.0000 | INHALATION_SPRAY | Freq: Every day | RESPIRATORY_TRACT | 5 refills | Status: DC

## 2023-12-25 MED ORDER — ALBUTEROL SULFATE HFA 108 (90 BASE) MCG/ACT IN AERS: 2.0000 | INHALATION_SPRAY | Freq: Four times a day (QID) | RESPIRATORY_TRACT | 1 refills | Status: AC | PRN

## 2023-12-25 NOTE — Telephone Encounter (Signed)
 Copied from CRM [id number]. Topic: Clinical - Medication Question >> Dec 24, 2023 11:46 AM Isabell A wrote: Reason for CRM: Patient would like change her pharmacy from [facility], requesting to send all of her medications to [facility] order.  Meds [facility] to [facility]. Nothing further needed

## 2023-12-27 ENCOUNTER — Other Ambulatory Visit: Payer: Self-pay

## 2023-12-27 DIAGNOSIS — F339 Major depressive disorder, recurrent, unspecified: Secondary | ICD-10-CM

## 2023-12-27 MED ORDER — DONEPEZIL HCL 10 MG PO TABS: 10.0000 mg | ORAL_TABLET | Freq: Every day | ORAL | 1 refills | Status: DC

## 2023-12-27 MED ORDER — IMIPRAMINE HCL 50 MG PO TABS: 50.0000 mg | ORAL_TABLET | Freq: Every day | ORAL | 3 refills | Status: AC

## 2024-02-05 ENCOUNTER — Telehealth: Payer: Self-pay

## 2024-02-05 NOTE — Telephone Encounter (Signed)
 Copied from CRM #[id number]. Topic: Clinical - Prescription Issue >> Feb 05, 2024 12:11 PM Chiquita SQUIBB wrote: Reason for CRM: Patient is calling in stating all of her other medications were transferred over to [facility] pharmacy but the atorvastatin  (LIPITOR) 20 MG tablet [[id number]]. Patient is asking for that to be sent to [facility] pharmacy as well.

## 2024-02-07 ENCOUNTER — Telehealth: Payer: Self-pay | Admitting: Cardiology

## 2024-02-07 MED ORDER — ATORVASTATIN CALCIUM 20 MG PO TABS: 20.0000 mg | ORAL_TABLET | Freq: Every day | ORAL | 1 refills | Status: AC

## 2024-02-07 NOTE — Telephone Encounter (Signed)
*  STAT* If patient is at the pharmacy, call can be transferred to refill team.   1. Which medications need to be refilled? (please list name of each medication and dose if known) atorvastatin  (LIPITOR) 20 MG tablet   2. Which pharmacy/location (including street and city if local pharmacy) is medication to be sent to?  [facility] - [place], [place] - [address]    3. Do they need a 30 day or 90 day supply? 90

## 2024-02-07 NOTE — Telephone Encounter (Signed)
 Pt's medication was sent to pt's pharmacy as requested. Confirmation received.

## 2024-02-25 NOTE — Telephone Encounter (Unsigned)
 Copied from CRM [id number]. Topic: Clinical - Medication Question >> Feb 25, 2024 12:28 PM Tiffany Jennings wrote: Reason for CRM: Pt is requesting a presctiption for Covid vaccine be sent to [facility] on [address]. Pt would like whichever is the best of the vaccines. Pls notify pt when it has been sent.

## 2024-03-05 ENCOUNTER — Other Ambulatory Visit: Payer: Self-pay | Admitting: Family Medicine

## 2024-03-05 DIAGNOSIS — I480 Paroxysmal atrial fibrillation: Secondary | ICD-10-CM

## 2024-03-12 ENCOUNTER — Ambulatory Visit: Admitting: Family Medicine

## 2024-03-20 ENCOUNTER — Telehealth: Payer: Self-pay

## 2024-03-20 NOTE — Telephone Encounter (Signed)
:   Patient wants to know if Dr. Berneta recommends her to get the pneumonia shot, her last one was 02/2022 and also wants to know if he recommends her getting the shingles vaccine. Please call [phone] (M)     Dr. Berneta Please advise

## 2024-03-23 ENCOUNTER — Other Ambulatory Visit: Payer: Self-pay | Admitting: Family Medicine

## 2024-03-23 DIAGNOSIS — Z1231 Encounter for screening mammogram for malignant neoplasm of breast: Secondary | ICD-10-CM

## 2024-04-13 DIAGNOSIS — M545 Low back pain, unspecified: Secondary | ICD-10-CM | POA: Diagnosis not present

## 2024-05-06 ENCOUNTER — Ambulatory Visit
Admission: RE | Admit: 2024-05-06 | Discharge: 2024-05-06 | Disposition: A | Discharge status: Home / Self Care | Source: Ambulatory Visit | Attending: Family Medicine | Admitting: Family Medicine

## 2024-05-06 DIAGNOSIS — Z1231 Encounter for screening mammogram for malignant neoplasm of breast: Secondary | ICD-10-CM

## 2024-05-18 ENCOUNTER — Other Ambulatory Visit: Payer: Self-pay | Admitting: Family Medicine

## 2024-05-18 ENCOUNTER — Telehealth: Payer: Self-pay

## 2024-05-18 ENCOUNTER — Other Ambulatory Visit: Payer: Self-pay | Admitting: Pulmonary Disease

## 2024-05-18 DIAGNOSIS — F339 Major depressive disorder, recurrent, unspecified: Secondary | ICD-10-CM

## 2024-05-18 NOTE — Telephone Encounter (Signed)
 Copied from CRM [id number]. Topic: Clinical - Medication Question >> May 18, 2024 10:48 AM Drema MATSU wrote: Reason for CRM: Patient wants to take Flonase for allergies. She wants to make sure that it doesn't interfere with her other medications.

## 2024-05-18 NOTE — Telephone Encounter (Signed)
See message from pt

## 2024-05-20 NOTE — Telephone Encounter (Signed)
 Spoke with pt daughter Sonny told her that Dr. Berneta said it was ok for her mom to use Flonase.

## 2024-06-10 ENCOUNTER — Other Ambulatory Visit: Payer: Self-pay | Admitting: Family Medicine

## 2024-06-10 DIAGNOSIS — F339 Major depressive disorder, recurrent, unspecified: Secondary | ICD-10-CM

## 2024-07-03 ENCOUNTER — Ambulatory Visit: Admitting: Family Medicine

## 2024-07-03 ENCOUNTER — Encounter: Payer: Self-pay | Admitting: Family Medicine

## 2024-07-03 VITALS — BP 116/64 | HR 88 | Temp 98.1°F | Ht 66.0 in | Wt 163.2 lb

## 2024-07-03 DIAGNOSIS — E7800 Pure hypercholesterolemia, unspecified: Secondary | ICD-10-CM

## 2024-07-03 DIAGNOSIS — R499 Unspecified voice and resonance disorder: Secondary | ICD-10-CM

## 2024-07-03 DIAGNOSIS — E039 Hypothyroidism, unspecified: Secondary | ICD-10-CM | POA: Diagnosis not present

## 2024-07-03 DIAGNOSIS — N1831 Chronic kidney disease, stage 3a: Secondary | ICD-10-CM

## 2024-07-03 DIAGNOSIS — Z780 Asymptomatic menopausal state: Secondary | ICD-10-CM | POA: Diagnosis not present

## 2024-07-03 LAB — TSH: TSH: 4.29 u[IU]/mL (ref 0.35–5.50)

## 2024-07-03 LAB — BASIC METABOLIC PANEL WITH GFR
BUN: 31 mg/dL — ABNORMAL HIGH (ref 6–23)
CO2: 28 meq/L (ref 19–32)
Calcium: 9.6 mg/dL (ref 8.4–10.5)
Chloride: 101 meq/L (ref 96–112)
Creatinine, Ser: 1.01 mg/dL (ref 0.40–1.20)
GFR: 50.22 mL/min — ABNORMAL LOW
Glucose, Bld: 104 mg/dL — ABNORMAL HIGH (ref 70–99)
Potassium: 4.4 meq/L (ref 3.5–5.1)
Sodium: 137 meq/L (ref 135–145)

## 2024-07-03 LAB — LDL CHOLESTEROL, DIRECT: Direct LDL: 49 mg/dL

## 2024-07-03 NOTE — Progress Notes (Signed)
 "  Established Patient Office Visit   Subjective:  Patient ID: Tiffany Jennings, female    DOB: 03/02/1935  Age: 89 y.o. MRN: [id number]  Chief Complaint  Patient presents with   Follow-up    No concerns     HPI Encounter Diagnoses  Name Primary?   Hypothyroidism, unspecified type Yes   Elevated cholesterol    Postmenopausal estrogen deficiency    Change in voice    Stage 3a chronic kidney disease (HCC)    For follow-up of above.  Doing well.  She is accompanied by her daughter.  Continues working out 5 days weekly at [employer] she does a combination of aerobic and weight training.  Denies shortness of breath with aerobic activity.  Feels as though she has had a change in her voice.  Denies postnasal drip, sneezing itchy watery eyes ears nose or throat.  History of tobacco use.  Continues atorvastatin  for elevated cholesterol.  Continues levothyroxine  50 mcg for hypothyroidism.  Reports that she has had her COVID shots.  She is due for her Shingrix vaccine.   Review of Systems  Constitutional: Negative.   HENT: Negative.    Eyes:  Negative for blurred vision, discharge and redness.  Respiratory: Negative.    Cardiovascular: Negative.   Gastrointestinal:  Negative for abdominal pain.  Genitourinary: Negative.   Musculoskeletal: Negative.  Negative for myalgias.  Skin:  Negative for rash.  Neurological:  Negative for tingling, loss of consciousness and weakness.  Endo/Heme/Allergies:  Negative for polydipsia.    Current Medications[1]   Objective:     BP 116/64 (BP Location: Left Arm, Patient Position: Sitting, Cuff Size: Normal)   Pulse 88   Temp 98.1 F (36.7 C) (Oral)   Ht 5' 6 (1.676 m)   Wt 163 lb 3.2 oz (74 kg)   SpO2 97%   BMI 26.34 kg/m    Physical Exam Constitutional:      General: She is not in acute distress.    Appearance: Normal appearance. She is not ill-appearing, toxic-appearing or diaphoretic.  HENT:     Head: Normocephalic and atraumatic.      Right Ear: External ear normal.     Left Ear: External ear normal.  Eyes:     General: No scleral icterus.       Right eye: No discharge.        Left eye: No discharge.     Extraocular Movements: Extraocular movements intact.     Conjunctiva/sclera: Conjunctivae normal.  Cardiovascular:     Rate and Rhythm: Normal rate. Rhythm irregularly irregular.  Pulmonary:     Effort: Pulmonary effort is normal. No respiratory distress.     Breath sounds: Normal breath sounds. Decreased air movement present.  Abdominal:     General: Bowel sounds are normal.     Tenderness: There is no abdominal tenderness. There is no guarding.  Musculoskeletal:     Cervical back: No rigidity or tenderness.  Skin:    General: Skin is warm and dry.  Neurological:     Mental Status: She is alert and oriented to person, place, and time.  Psychiatric:        Mood and Affect: Mood normal.        Behavior: Behavior normal.      No results found for any visits on 07/03/24.    The ASCVD Risk score (Arnett DK, et al., 2019) failed to calculate for the following reasons:   The 2019 ASCVD risk score is only  valid for ages 85 to 4   * - Cholesterol units were assumed    Assessment & Plan:   Hypothyroidism, unspecified type -     TSH  Elevated cholesterol -     LDL cholesterol, direct  Postmenopausal estrogen deficiency -     DG Bone Density; Future  Change in voice -     Ambulatory referral to ENT  Stage 3a chronic kidney disease (HCC) -     Basic metabolic panel with GFR    Return in about 6 months (around 12/31/2024), or Please have the Shingrix vaccine at your pharmacy.[facility] for DEXA scan.  Follow-up on hypothyroidism.  Follow-up on elevated cholesterol.  Continues with atorvastatin .  Information on the Mediterranean diet given.  Elsie Sim Lent, MD    [1]  Current Outpatient Medications:    albuterol  (VENTOLIN  HFA) 108 (90 Base) MCG/ACT inhaler, Inhale 2 puffs into the lungs  every 6 (six) hours as needed for wheezing or shortness of breath., Disp: 18 g, Rfl: 1   atorvastatin  (LIPITOR) 20 MG tablet, Take 1 tablet (20 mg total) by mouth daily., Disp: 90 tablet, Rfl: 1   carboxymethylcellulose (REFRESH PLUS) 0.5 % SOLN, 1 drop 3 (three) times daily as needed., Disp: , Rfl:    cetirizine (ZYRTEC) 5 MG chewable tablet, Chew 5 mg by mouth daily., Disp: , Rfl:    diltiazem  (CARDIZEM  CD) 180 MG 24 hr capsule, Take 1 capsule (180 mg total) by mouth daily., Disp: 90 capsule, Rfl: 2   donepezil  (ARICEPT ) 10 MG tablet, TAKE 1 TABLET AT BEDTIME, Disp: 30 tablet, Rfl: 11   ELIQUIS  5 MG TABS tablet, TAKE 1 TABLET TWICE DAILY, Disp: 180 tablet, Rfl: 3   Fluticasone-Umeclidin-Vilant (TRELEGY ELLIPTA ) 100-62.5-25 MCG/ACT AEPB, INHALE 1 PUFF INTO THE LUNGS DAILY., Disp: 180 each, Rfl: 0   imipramine  (TOFRANIL ) 50 MG tablet, Take 1-2 tablets (50-100 mg total) by mouth at bedtime., Disp: 180 tablet, Rfl: 3   levothyroxine  (SYNTHROID ) 50 MCG tablet, Take 1 tablet (50 mcg total) by mouth daily before breakfast., Disp: 90 tablet, Rfl: 4  "

## 2024-07-06 ENCOUNTER — Ambulatory Visit: Payer: Self-pay | Admitting: Family Medicine

## 2024-07-08 ENCOUNTER — Ambulatory Visit: Admitting: Pulmonary Disease

## 2024-07-08 ENCOUNTER — Encounter: Payer: Self-pay | Admitting: Pulmonary Disease

## 2024-07-08 VITALS — BP 124/58 | HR 98 | Temp 97.2°F | Ht 66.0 in | Wt 160.4 lb

## 2024-07-08 DIAGNOSIS — J9610 Chronic respiratory failure, unspecified whether with hypoxia or hypercapnia: Secondary | ICD-10-CM | POA: Diagnosis not present

## 2024-07-08 DIAGNOSIS — J449 Chronic obstructive pulmonary disease, unspecified: Secondary | ICD-10-CM

## 2024-07-08 DIAGNOSIS — I4891 Unspecified atrial fibrillation: Secondary | ICD-10-CM

## 2024-07-08 DIAGNOSIS — Z87891 Personal history of nicotine dependence: Secondary | ICD-10-CM | POA: Diagnosis not present

## 2024-07-08 DIAGNOSIS — J9611 Chronic respiratory failure with hypoxia: Secondary | ICD-10-CM

## 2024-07-08 DIAGNOSIS — G4733 Obstructive sleep apnea (adult) (pediatric): Secondary | ICD-10-CM

## 2024-07-08 NOTE — Telephone Encounter (Signed)
 Returned to patient's daughter Sonny Sander who is on DPR on file. The patient's daughter has been notified of the result and verbalized understanding. All questions (if any) were answered. She thanked me for calling and stated that someone called her yesterday with the results.

## 2024-07-08 NOTE — Progress Notes (Signed)
 "              Tiffany Jennings    [id number]    1934/07/04  Primary Care Physician:Kremer, Elsie Sayre, MD  Referring Physician: Berneta Elsie Sayre, MD [address] [place],  [place] [address]  Chief complaint:   Patient We will advanced chronic obstructive pulm disease, chronic respiratory failure In for follow-up today  HPI:  She continues to do well She does stay very active, goes to the gym about 5 days a week  She mentions some concerns about seeing high numbers on AHI with a BiPAP Download shows an AHI of 6.9 over the last 30 days-this was discussed with her  She continues on her usual inhalers  History of atrial fibrillation not controlled  She uses BiPAP at night with 2 L of oxygen  Has used BiPAP now for over 4 years  She is on Trelegy and compliant  Portable concentrator oxygen  use as needed  Outpatient Encounter Medications as of 07/08/2024  Medication Sig   albuterol  (VENTOLIN  HFA) 108 (90 Base) MCG/ACT inhaler Inhale 2 puffs into the lungs every 6 (six) hours as needed for wheezing or shortness of breath.   atorvastatin  (LIPITOR) 20 MG tablet Take 1 tablet (20 mg total) by mouth daily.   carboxymethylcellulose (REFRESH PLUS) 0.5 % SOLN 1 drop 3 (three) times daily as needed.   cetirizine (ZYRTEC) 5 MG chewable tablet Chew 5 mg by mouth daily.   diltiazem  (CARDIZEM  CD) 180 MG 24 hr capsule Take 1 capsule (180 mg total) by mouth daily.   donepezil  (ARICEPT ) 10 MG tablet TAKE 1 TABLET AT BEDTIME   ELIQUIS  5 MG TABS tablet TAKE 1 TABLET TWICE DAILY   Fluticasone-Umeclidin-Vilant (TRELEGY ELLIPTA ) 100-62.5-25 MCG/ACT AEPB INHALE 1 PUFF INTO THE LUNGS DAILY.   imipramine  (TOFRANIL ) 50 MG tablet Take 1-2 tablets (50-100 mg total) by mouth at bedtime.   levothyroxine  (SYNTHROID ) 50 MCG tablet Take 1 tablet (50 mcg total) by mouth daily before breakfast.   No facility-administered encounter medications on file as of 07/08/2024.    Allergies as of  07/08/2024 - Review Complete 07/08/2024  Allergen Reaction Noted   Bee pollen  07/03/2024   Codeine Swelling 02/19/2018    Past Medical History:  Diagnosis Date   A-fib (HCC)    COPD (chronic obstructive pulmonary disease) (HCC)    Hyperlipidemia    Hypertension     Past Surgical History:  Procedure Laterality Date   CAROTID ANGIOGRAM      Family History  Problem Relation Age of Onset   Breast cancer Daughter 14   Breast cancer Maternal Grandmother 41   BRCA 1/2 Neg Hx     Social History   Socioeconomic History   Marital status: Single    Spouse name: Not on file   Number of children: Not on file   Years of education: Not on file   Highest education level: Associate degree: occupational, scientist, product/process development, or vocational program  Occupational History   Not on file  Tobacco Use   Smoking status: Former    Current packs/day: 0.00    Average packs/day: 0.3 packs/day for 50.0 years (12.5 ttl pk-yrs)    Types: Cigarettes    Start date: 04/19/1971    Quit date: 04/18/2021    Years since quitting: 3.2   Smokeless tobacco: Never  Vaping Use   Vaping status: Never Used  Substance and Sexual Activity   Alcohol use: Not Currently   Drug use: Never   Sexual  activity: Not Currently    Partners: Male    Comment: DIVORCED  Other Topics Concern   Not on file  Social History Narrative   Not on file   Social Drivers of Health   Tobacco Use: Medium Risk (07/08/2024)   Patient History    Smoking Tobacco Use: Former    Smokeless Tobacco Use: Never    Passive Exposure: Not on file  Financial Resource Strain: Low Risk (08/30/2023)   Overall Financial Resource Strain (CARDIA)    Difficulty of Paying Living Expenses: Not hard at all  Food Insecurity: No Food Insecurity (08/30/2023)   Hunger Vital Sign    Worried About Running Out of Food in the Last Year: Never true    Ran Out of Food in the Last Year: Never true  Transportation Needs: No Transportation Needs (08/30/2023)   PRAPARE -  Administrator, Civil Service (Medical): No    Lack of Transportation (Non-Medical): No  Physical Activity: Sufficiently Active (08/30/2023)   Exercise Vital Sign    Days of Exercise per Week: 5 days    Minutes of Exercise per Session: 120 min  Stress: No Stress Concern Present (08/30/2023)   [employer] of Occupational Health - Occupational Stress Questionnaire    Feeling of Stress : Not at all  Social Connections: Moderately Isolated (08/30/2023)   Social Connection and Isolation Panel    Frequency of Communication with Friends and Family: More than three times a week    Frequency of Social Gatherings with Friends and Family: More than three times a week    Attends Religious Services: Never    Database Administrator or Organizations: Yes    Attends Engineer, Structural: More than 4 times per year    Marital Status: Divorced  Intimate Partner Violence: Not At Risk (08/30/2023)   Humiliation, Afraid, Rape, and Kick questionnaire    Fear of Current or Ex-Partner: No    Emotionally Abused: No    Physically Abused: No    Sexually Abused: No  Depression (PHQ2-9): Low Risk (09/09/2023)   Depression (PHQ2-9)    PHQ-2 Score: 0  Alcohol Screen: Low Risk (08/30/2023)   Alcohol Screen    Last Alcohol Screening Score (AUDIT): 0  Housing: Unknown (08/30/2023)   Housing Stability Vital Sign    Unable to Pay for Housing in the Last Year: No    Number of Times Moved in the Last Year: Not on file    Homeless in the Last Year: No  Utilities: Not At Risk (08/30/2023)   AHC Utilities    Threatened with loss of utilities: No  Health Literacy: Adequate Health Literacy (08/30/2023)   B1300 Health Literacy    Frequency of need for help with medical instructions: Never    Review of Systems  Constitutional: Negative.   HENT: Negative.    Eyes: Negative.   Respiratory:  Positive for shortness of breath. Negative for wheezing.   Cardiovascular: Negative.   All other systems  reviewed and are negative.   Vitals:   07/08/24 1110  BP: (!) 124/58  Pulse: 98  Temp: (!) 97.2 F (36.2 C)  SpO2: 92%     Physical Exam Constitutional:      Appearance: Normal appearance. She is well-developed.  HENT:     Head: Normocephalic and atraumatic.     Nose: Nose normal.     Mouth/Throat:     Mouth: Mucous membranes are moist.  Eyes:     General:  Right eye: No discharge.        Left eye: No discharge.     Pupils: Pupils are equal, round, and reactive to light.  Neck:     Thyroid : No thyromegaly.     Trachea: No tracheal deviation.  Cardiovascular:     Rate and Rhythm: Normal rate and regular rhythm.  Pulmonary:     Effort: Pulmonary effort is normal. No respiratory distress.     Breath sounds: No stridor. No wheezing, rhonchi or rales.     Comments: Has few rales at the bases Overall decreased air movement Musculoskeletal:     Cervical back: No rigidity.  Skin:    General: Skin is warm.  Neurological:     General: No focal deficit present.     Mental Status: She is alert.  Psychiatric:        Mood and Affect: Mood normal.     Data Reviewed: Records from previous primary physician reviewed Had a CT scan done in 2019 showing some granulomatous disease-stable from previous  BiPAP compliance shows 97% use the last 30 days Average use of 7 hours 8 minutes 12/5 with a backup rate of 10 AHI of 6.9 Most recent blood work shows a bicarb level of 28   Assessment:   Chronic respiratory failure Obstructive lung disease -Continue BiPAP at night -Oxygen  at 2 L with BiPAP  Continue Trelegy  Atrial fibrillation -Controlled  Did desaturate with ambulation Required 1 L of oxygen  with activity  ABG on record 07/16/2023 shows 7.45/36/91  Plan/Recommendations:  Continue BiPAP at night Oxygen  with BiPAP at night  Encouraged to continue graded activities as tolerated  Continue current inhalers  Oxygen  at 1 L with activity  No changes  with the inhaler at present  Follow-up in 6 months  I spent 30 minutes dedicated to the care of this patient on the date of this encounter to include previsit review of records, face-to-face time with the patient discussing conditions above, post visit ordering of testing,ordering medications,independentlyinterpreting results, clinical documentation with electronic health record   Imaging was seen a bit  Jennet Epley MD [facility] 07/08/2024, 12:19 PM  CC: Berneta Elsie Sayre, MD   "

## 2024-07-08 NOTE — Patient Instructions (Signed)
 Continue to exercise on a regular basis -The amount of exercise is less important than making sure you are doing it consistently  Continue your inhalers  Continue your BiPAP at night  The average numbers look good at 6.9  If you are seeing numbers consistently in the tens and above, we can make changes to the machine  Follow-up in 6 months

## 2024-07-08 NOTE — Telephone Encounter (Signed)
 Copied from CRM #[id number]. Topic: Clinical - Lab/Test Results >> Jul 07, 2024  3:19 PM Charolett L wrote: Reason for CRM: Patient called in and stated that she would like a call back to go over lab results CB# [phone]

## 2024-07-29 ENCOUNTER — Other Ambulatory Visit

## 2024-08-13 ENCOUNTER — Institutional Professional Consult (permissible substitution) (INDEPENDENT_AMBULATORY_CARE_PROVIDER_SITE_OTHER)

## 2024-08-31 ENCOUNTER — Ambulatory Visit

## 2024-10-28 ENCOUNTER — Ambulatory Visit: Admitting: Cardiology

## 2025-01-04 ENCOUNTER — Ambulatory Visit: Admitting: Family Medicine
